# Patient Record
Sex: Male | Born: 1950 | Race: White | Hispanic: No | Smoking: Current every day smoker
Health system: Southern US, Community
[De-identification: ages and names within clinical notes are randomized; demographics above are authoritative.]

## PROBLEM LIST (undated history)

## (undated) DIAGNOSIS — J309 Allergic rhinitis, unspecified: Secondary | ICD-10-CM

## (undated) DIAGNOSIS — M1990 Unspecified osteoarthritis, unspecified site: Secondary | ICD-10-CM

## (undated) DIAGNOSIS — G8929 Other chronic pain: Secondary | ICD-10-CM

## (undated) DIAGNOSIS — Z955 Presence of coronary angioplasty implant and graft: Secondary | ICD-10-CM

## (undated) DIAGNOSIS — D649 Anemia, unspecified: Secondary | ICD-10-CM

## (undated) DIAGNOSIS — Z5189 Encounter for other specified aftercare: Secondary | ICD-10-CM

## (undated) DIAGNOSIS — C689 Malignant neoplasm of urinary organ, unspecified: Secondary | ICD-10-CM

## (undated) DIAGNOSIS — I10 Essential (primary) hypertension: Secondary | ICD-10-CM

## (undated) DIAGNOSIS — K635 Polyp of colon: Secondary | ICD-10-CM

## (undated) DIAGNOSIS — I219 Acute myocardial infarction, unspecified: Secondary | ICD-10-CM

## (undated) DIAGNOSIS — G629 Polyneuropathy, unspecified: Secondary | ICD-10-CM

## (undated) DIAGNOSIS — J449 Chronic obstructive pulmonary disease, unspecified: Secondary | ICD-10-CM

## (undated) DIAGNOSIS — N433 Hydrocele, unspecified: Secondary | ICD-10-CM

## (undated) DIAGNOSIS — C679 Malignant neoplasm of bladder, unspecified: Secondary | ICD-10-CM

## (undated) DIAGNOSIS — E785 Hyperlipidemia, unspecified: Secondary | ICD-10-CM

## (undated) DIAGNOSIS — R5383 Other fatigue: Secondary | ICD-10-CM

## (undated) DIAGNOSIS — G4730 Sleep apnea, unspecified: Secondary | ICD-10-CM

## (undated) DIAGNOSIS — I509 Heart failure, unspecified: Secondary | ICD-10-CM

## (undated) DIAGNOSIS — Z87891 Personal history of nicotine dependence: Secondary | ICD-10-CM

## (undated) DIAGNOSIS — IMO0002 Reserved for concepts with insufficient information to code with codable children: Secondary | ICD-10-CM

## (undated) DIAGNOSIS — I4510 Unspecified right bundle-branch block: Secondary | ICD-10-CM

## (undated) DIAGNOSIS — F419 Anxiety disorder, unspecified: Secondary | ICD-10-CM

## (undated) DIAGNOSIS — K219 Gastro-esophageal reflux disease without esophagitis: Secondary | ICD-10-CM

## (undated) DIAGNOSIS — I2510 Atherosclerotic heart disease of native coronary artery without angina pectoris: Secondary | ICD-10-CM

## (undated) DIAGNOSIS — D689 Coagulation defect, unspecified: Secondary | ICD-10-CM

## (undated) HISTORY — PX: UPPER GASTROINTESTINAL ENDOSCOPY: SHX188

## (undated) HISTORY — DX: Malignant neoplasm of urinary organ, unspecified: C68.9

## (undated) HISTORY — PX: MOUTH SURGERY: SHX715

## (undated) HISTORY — DX: Coagulation defect, unspecified: D68.9

## (undated) HISTORY — DX: Unspecified osteoarthritis, unspecified site: M19.90

## (undated) HISTORY — DX: Polyp of colon: K63.5

## (undated) HISTORY — DX: Presence of coronary angioplasty implant and graft: Z95.5

## (undated) HISTORY — PX: KNEE CARTILAGE SURGERY: SHX688

## (undated) HISTORY — DX: Heart failure, unspecified: I50.9

## (undated) HISTORY — PX: BLADDER SURGERY: SHX569

## (undated) HISTORY — DX: Encounter for other specified aftercare: Z51.89

## (undated) HISTORY — PX: CORONARY ANGIOPLASTY: SHX604

## (undated) HISTORY — PX: CARDIAC CATHETERIZATION: SHX172

## (undated) HISTORY — DX: Other fatigue: R53.83

## (undated) HISTORY — PX: COLONOSCOPY: SHX174

## (undated) HISTORY — PX: OTHER SURGICAL HISTORY: SHX169

## (undated) HISTORY — DX: Polyneuropathy, unspecified: G62.9

## (undated) HISTORY — PX: APPENDECTOMY: SHX54

## (undated) HISTORY — PX: ESOPHAGOGASTRODUODENOSCOPY: SHX1529

## (undated) HISTORY — DX: Allergic rhinitis, unspecified: J30.9

---

## 2004-06-18 ENCOUNTER — Inpatient Hospital Stay (HOSPITAL_COMMUNITY): Admission: AD | Admit: 2004-06-18 | Discharge: 2004-06-21 | Payer: Self-pay | Admitting: Cardiology

## 2013-08-08 ENCOUNTER — Emergency Department (HOSPITAL_COMMUNITY): Payer: Medicaid Other

## 2013-08-08 ENCOUNTER — Encounter (HOSPITAL_COMMUNITY)
Admission: EM | Disposition: A | Discharge status: Home / Self Care | Payer: Self-pay | Source: Home / Self Care | Attending: Cardiothoracic Surgery

## 2013-08-08 ENCOUNTER — Encounter (HOSPITAL_COMMUNITY): Payer: Self-pay | Admitting: Emergency Medicine

## 2013-08-08 ENCOUNTER — Inpatient Hospital Stay (HOSPITAL_COMMUNITY)
Admission: EM | Admit: 2013-08-08 | Discharge: 2013-08-15 | DRG: 234 | Disposition: A | Discharge status: Home / Self Care | Payer: Medicaid Other | Attending: Cardiothoracic Surgery | Admitting: Cardiothoracic Surgery

## 2013-08-08 DIAGNOSIS — J4489 Other specified chronic obstructive pulmonary disease: Secondary | ICD-10-CM | POA: Diagnosis present

## 2013-08-08 DIAGNOSIS — I200 Unstable angina: Secondary | ICD-10-CM

## 2013-08-08 DIAGNOSIS — F1010 Alcohol abuse, uncomplicated: Secondary | ICD-10-CM | POA: Diagnosis present

## 2013-08-08 DIAGNOSIS — D696 Thrombocytopenia, unspecified: Secondary | ICD-10-CM | POA: Diagnosis not present

## 2013-08-08 DIAGNOSIS — J449 Chronic obstructive pulmonary disease, unspecified: Secondary | ICD-10-CM | POA: Diagnosis present

## 2013-08-08 DIAGNOSIS — I252 Old myocardial infarction: Secondary | ICD-10-CM

## 2013-08-08 DIAGNOSIS — R5381 Other malaise: Secondary | ICD-10-CM | POA: Diagnosis present

## 2013-08-08 DIAGNOSIS — Z8551 Personal history of malignant neoplasm of bladder: Secondary | ICD-10-CM

## 2013-08-08 DIAGNOSIS — F17200 Nicotine dependence, unspecified, uncomplicated: Secondary | ICD-10-CM | POA: Diagnosis present

## 2013-08-08 DIAGNOSIS — R079 Chest pain, unspecified: Secondary | ICD-10-CM

## 2013-08-08 DIAGNOSIS — I2510 Atherosclerotic heart disease of native coronary artery without angina pectoris: Principal | ICD-10-CM | POA: Diagnosis present

## 2013-08-08 DIAGNOSIS — Z9119 Patient's noncompliance with other medical treatment and regimen: Secondary | ICD-10-CM

## 2013-08-08 DIAGNOSIS — J9819 Other pulmonary collapse: Secondary | ICD-10-CM | POA: Diagnosis not present

## 2013-08-08 DIAGNOSIS — E876 Hypokalemia: Secondary | ICD-10-CM | POA: Diagnosis not present

## 2013-08-08 DIAGNOSIS — Z91199 Patient's noncompliance with other medical treatment and regimen due to unspecified reason: Secondary | ICD-10-CM

## 2013-08-08 DIAGNOSIS — G8929 Other chronic pain: Secondary | ICD-10-CM | POA: Diagnosis present

## 2013-08-08 DIAGNOSIS — Z7982 Long term (current) use of aspirin: Secondary | ICD-10-CM

## 2013-08-08 DIAGNOSIS — Z8249 Family history of ischemic heart disease and other diseases of the circulatory system: Secondary | ICD-10-CM

## 2013-08-08 DIAGNOSIS — I10 Essential (primary) hypertension: Secondary | ICD-10-CM | POA: Diagnosis present

## 2013-08-08 DIAGNOSIS — D62 Acute posthemorrhagic anemia: Secondary | ICD-10-CM | POA: Diagnosis not present

## 2013-08-08 DIAGNOSIS — Z9861 Coronary angioplasty status: Secondary | ICD-10-CM

## 2013-08-08 DIAGNOSIS — Z951 Presence of aortocoronary bypass graft: Secondary | ICD-10-CM

## 2013-08-08 DIAGNOSIS — M129 Arthropathy, unspecified: Secondary | ICD-10-CM | POA: Diagnosis present

## 2013-08-08 DIAGNOSIS — I959 Hypotension, unspecified: Secondary | ICD-10-CM | POA: Diagnosis not present

## 2013-08-08 DIAGNOSIS — E785 Hyperlipidemia, unspecified: Secondary | ICD-10-CM | POA: Diagnosis present

## 2013-08-08 HISTORY — DX: Malignant neoplasm of bladder, unspecified: C67.9

## 2013-08-08 HISTORY — DX: Essential (primary) hypertension: I10

## 2013-08-08 HISTORY — DX: Reserved for concepts with insufficient information to code with codable children: IMO0002

## 2013-08-08 HISTORY — DX: Chronic obstructive pulmonary disease, unspecified: J44.9

## 2013-08-08 HISTORY — DX: Atherosclerotic heart disease of native coronary artery without angina pectoris: I25.10

## 2013-08-08 HISTORY — DX: Unstable angina: I20.0

## 2013-08-08 HISTORY — DX: Hydrocele, unspecified: N43.3

## 2013-08-08 HISTORY — DX: Hyperlipidemia, unspecified: E78.5

## 2013-08-08 HISTORY — DX: Chest pain, unspecified: R07.9

## 2013-08-08 HISTORY — PX: LEFT HEART CATHETERIZATION WITH CORONARY ANGIOGRAM: SHX5451

## 2013-08-08 HISTORY — DX: Unspecified osteoarthritis, unspecified site: M19.90

## 2013-08-08 LAB — URINALYSIS, ROUTINE W REFLEX MICROSCOPIC
Bilirubin Urine: NEGATIVE
Glucose, UA: NEGATIVE mg/dL
Hgb urine dipstick: NEGATIVE
Ketones, ur: NEGATIVE mg/dL
Leukocytes, UA: NEGATIVE
Nitrite: NEGATIVE
Protein, ur: NEGATIVE mg/dL
Specific Gravity, Urine: 1.01 (ref 1.005–1.030)
Urobilinogen, UA: 0.2 mg/dL (ref 0.0–1.0)
pH: 6 (ref 5.0–8.0)

## 2013-08-08 LAB — CBC WITH DIFFERENTIAL/PLATELET
Basophils Absolute: 0.1 10*3/uL (ref 0.0–0.1)
Basophils Relative: 1 % (ref 0–1)
Eosinophils Absolute: 0.4 10*3/uL (ref 0.0–0.7)
Eosinophils Relative: 4 % (ref 0–5)
HCT: 42.6 % (ref 39.0–52.0)
Hemoglobin: 15 g/dL (ref 13.0–17.0)
Lymphocytes Relative: 27 % (ref 12–46)
Lymphs Abs: 2.2 10*3/uL (ref 0.7–4.0)
MCH: 34.3 pg — ABNORMAL HIGH (ref 26.0–34.0)
MCHC: 35.2 g/dL (ref 30.0–36.0)
MCV: 97.5 fL (ref 78.0–100.0)
Monocytes Absolute: 1.1 10*3/uL — ABNORMAL HIGH (ref 0.1–1.0)
Monocytes Relative: 13 % — ABNORMAL HIGH (ref 3–12)
Neutro Abs: 4.4 10*3/uL (ref 1.7–7.7)
Neutrophils Relative %: 55 % (ref 43–77)
Platelets: 201 10*3/uL (ref 150–400)
RBC: 4.37 MIL/uL (ref 4.22–5.81)
RDW: 12.9 % (ref 11.5–15.5)
WBC: 8 10*3/uL (ref 4.0–10.5)

## 2013-08-08 LAB — POCT I-STAT 3, ART BLOOD GAS (G3+)
Bicarbonate: 23.6 mEq/L (ref 20.0–24.0)
O2 Saturation: 96 %
Patient temperature: 98.6
TCO2: 25 mmol/L (ref 0–100)
pCO2 arterial: 41.9 mmHg (ref 35.0–45.0)
pH, Arterial: 7.358 (ref 7.350–7.450)

## 2013-08-08 LAB — CREATININE, SERUM: Creatinine, Ser: 0.86 mg/dL (ref 0.50–1.35)

## 2013-08-08 LAB — BASIC METABOLIC PANEL
BUN: 15 mg/dL (ref 6–23)
CO2: 25 mEq/L (ref 19–32)
Calcium: 9 mg/dL (ref 8.4–10.5)
Chloride: 103 mEq/L (ref 96–112)
Creatinine, Ser: 0.83 mg/dL (ref 0.50–1.35)
GFR calc Af Amer: >90 mL/min (ref >90)
GFR calc non Af Amer: >90 mL/min (ref >90)
Glucose, Bld: 103 mg/dL — ABNORMAL HIGH (ref 70–99)
Potassium: 3.9 mEq/L (ref 3.5–5.1)
Sodium: 137 mEq/L (ref 135–145)

## 2013-08-08 LAB — CBC
Hemoglobin: 14.4 g/dL (ref 13.0–17.0)
MCH: 34 pg (ref 26.0–34.0)
MCHC: 35.3 g/dL (ref 30.0–36.0)
MCV: 96.5 fL (ref 78.0–100.0)
RBC: 4.23 MIL/uL (ref 4.22–5.81)
RDW: 13.1 % (ref 11.5–15.5)

## 2013-08-08 LAB — POCT I-STAT TROPONIN I: Troponin i, poc: 0.01 ng/mL (ref 0.00–0.08)

## 2013-08-08 LAB — PROTIME-INR
INR: 1.14 (ref 0.00–1.49)
Prothrombin Time: 14.4 seconds (ref 11.6–15.2)

## 2013-08-08 LAB — TROPONIN I: Troponin I: <0.3 ng/mL (ref <0.30)

## 2013-08-08 LAB — SURGICAL PCR SCREEN: MRSA, PCR: NEGATIVE

## 2013-08-08 LAB — PREPARE RBC (CROSSMATCH)

## 2013-08-08 SURGERY — LEFT HEART CATHETERIZATION WITH CORONARY ANGIOGRAM
Anesthesia: LOCAL

## 2013-08-08 MED ORDER — CHLORHEXIDINE GLUCONATE 4 % EX LIQD: 60.0000 mL | Freq: Once | CUTANEOUS | Status: DC

## 2013-08-08 MED ORDER — HEPARIN SODIUM (PORCINE) 5000 UNIT/ML IJ SOLN
5000.0000 [IU] | Freq: Three times a day (TID) | INTRAMUSCULAR | Status: DC
  Filled 2013-08-08 (×3): qty 1

## 2013-08-08 MED ORDER — BISACODYL 5 MG PO TBEC: 5.0000 mg | DELAYED_RELEASE_TABLET | Freq: Once | ORAL | Status: DC

## 2013-08-08 MED ORDER — MORPHINE SULFATE 4 MG/ML IJ SOLN
4.0000 mg | INTRAMUSCULAR | Status: DC | PRN
  Administered 2013-08-10: 4 mg via INTRAVENOUS
  Filled 2013-08-08: qty 1

## 2013-08-08 MED ORDER — MIDAZOLAM HCL 2 MG/2ML IJ SOLN
INTRAMUSCULAR | Status: AC
  Filled 2013-08-08: qty 2

## 2013-08-08 MED ORDER — NITROGLYCERIN 0.4 MG SL SUBL: 0.4000 mg | SUBLINGUAL_TABLET | SUBLINGUAL | Status: DC | PRN

## 2013-08-08 MED ORDER — HEPARIN SODIUM (PORCINE) 1000 UNIT/ML IJ SOLN
INTRAMUSCULAR | Status: AC
  Filled 2013-08-08: qty 1

## 2013-08-08 MED ORDER — ASPIRIN EC 81 MG PO TBEC
81.0000 mg | DELAYED_RELEASE_TABLET | Freq: Every day | ORAL | Status: DC
Start: 2013-08-09 — End: 2013-08-10
  Administered 2013-08-09: 81 mg via ORAL
  Filled 2013-08-08 (×2): qty 1

## 2013-08-08 MED ORDER — DIAZEPAM 5 MG PO TABS
5.0000 mg | ORAL_TABLET | Freq: Once | ORAL | Status: AC
  Administered 2013-08-10: 5 mg via ORAL
  Filled 2013-08-08: qty 1

## 2013-08-08 MED ORDER — HEPARIN (PORCINE) IN NACL 2-0.9 UNIT/ML-% IJ SOLN
INTRAMUSCULAR | Status: AC
  Filled 2013-08-08: qty 1500

## 2013-08-08 MED ORDER — SODIUM CHLORIDE 0.9 % IV SOLN: 250.0000 mL | INTRAVENOUS | Status: DC | PRN

## 2013-08-08 MED ORDER — SODIUM CHLORIDE 0.9 % IV SOLN
INTRAVENOUS | Status: DC
  Administered 2013-08-08 – 2013-08-09 (×2): via INTRAVENOUS

## 2013-08-08 MED ORDER — ASPIRIN 81 MG PO CHEW
81.0000 mg | CHEWABLE_TABLET | ORAL | Status: AC
  Administered 2013-08-08: 81 mg via ORAL
  Filled 2013-08-08: qty 1

## 2013-08-08 MED ORDER — FENTANYL CITRATE 0.05 MG/ML IJ SOLN
INTRAMUSCULAR | Status: AC
  Filled 2013-08-08: qty 2

## 2013-08-08 MED ORDER — SODIUM CHLORIDE 0.9 % IV SOLN
INTRAVENOUS | Status: DC
  Administered 2013-08-08: 12:00:00 via INTRAVENOUS

## 2013-08-08 MED ORDER — METOPROLOL TARTRATE 12.5 MG HALF TABLET
12.5000 mg | ORAL_TABLET | Freq: Once | ORAL | Status: DC
  Filled 2013-08-08: qty 1

## 2013-08-08 MED ORDER — ALPRAZOLAM 0.25 MG PO TABS: 0.2500 mg | ORAL_TABLET | ORAL | Status: DC | PRN

## 2013-08-08 MED ORDER — TEMAZEPAM 15 MG PO CAPS
15.0000 mg | ORAL_CAPSULE | Freq: Once | ORAL | Status: AC | PRN
  Administered 2013-08-09: 15 mg via ORAL
  Filled 2013-08-08: qty 1

## 2013-08-08 MED ORDER — LIDOCAINE HCL (PF) 1 % IJ SOLN
INTRAMUSCULAR | Status: AC
  Filled 2013-08-08: qty 30

## 2013-08-08 MED ORDER — NITROGLYCERIN 0.2 MG/ML ON CALL CATH LAB
INTRAVENOUS | Status: AC
  Filled 2013-08-08: qty 1

## 2013-08-08 MED ORDER — SODIUM CHLORIDE 0.9 % IJ SOLN: 3.0000 mL | INTRAMUSCULAR | Status: DC | PRN

## 2013-08-08 MED ORDER — TEMAZEPAM 15 MG PO CAPS: 15.0000 mg | ORAL_CAPSULE | Freq: Once | ORAL | Status: DC | PRN

## 2013-08-08 MED ORDER — VERAPAMIL HCL 2.5 MG/ML IV SOLN
INTRAVENOUS | Status: AC
  Filled 2013-08-08: qty 2

## 2013-08-08 MED ORDER — GUAIFENESIN-DM 100-10 MG/5ML PO SYRP
5.0000 mL | ORAL_SOLUTION | ORAL | Status: DC | PRN
  Administered 2013-08-09: 5 mL via ORAL
  Filled 2013-08-08: qty 5

## 2013-08-08 MED ORDER — NICOTINE 14 MG/24HR TD PT24
14.0000 mg | MEDICATED_PATCH | Freq: Every day | TRANSDERMAL | Status: DC
  Filled 2013-08-08 (×3): qty 1

## 2013-08-08 MED ORDER — SODIUM CHLORIDE 0.9 % IJ SOLN: 3.0000 mL | Freq: Two times a day (BID) | INTRAMUSCULAR | Status: DC

## 2013-08-08 MED ORDER — NITROGLYCERIN IN D5W 200-5 MCG/ML-% IV SOLN
INTRAVENOUS | Status: AC
  Filled 2013-08-08: qty 250

## 2013-08-08 MED ORDER — NITROGLYCERIN IN D5W 200-5 MCG/ML-% IV SOLN
2.0000 ug/min | INTRAVENOUS | Status: DC
  Filled 2013-08-08: qty 250

## 2013-08-08 MED ORDER — ONDANSETRON HCL 4 MG/2ML IJ SOLN: 4.0000 mg | Freq: Four times a day (QID) | INTRAMUSCULAR | Status: DC | PRN

## 2013-08-08 MED ORDER — HEPARIN (PORCINE) IN NACL 100-0.45 UNIT/ML-% IJ SOLN
1100.0000 [IU]/h | INTRAMUSCULAR | Status: DC
  Administered 2013-08-08: 900 [IU]/h via INTRAVENOUS
  Administered 2013-08-09: 1100 [IU]/h via INTRAVENOUS
  Filled 2013-08-08 (×4): qty 250

## 2013-08-08 MED ORDER — ACETAMINOPHEN 325 MG PO TABS
650.0000 mg | ORAL_TABLET | ORAL | Status: DC | PRN
  Administered 2013-08-10: 650 mg via ORAL
  Filled 2013-08-08: qty 2

## 2013-08-08 MED ORDER — BUDESONIDE-FORMOTEROL FUMARATE 160-4.5 MCG/ACT IN AERO
2.0000 | INHALATION_SPRAY | Freq: Two times a day (BID) | RESPIRATORY_TRACT | Status: DC
  Administered 2013-08-08 – 2013-08-09 (×3): 2 via RESPIRATORY_TRACT
  Filled 2013-08-08: qty 6

## 2013-08-08 MED ORDER — ATORVASTATIN CALCIUM 80 MG PO TABS
80.0000 mg | ORAL_TABLET | Freq: Every day | ORAL | Status: DC
  Administered 2013-08-08 – 2013-08-09 (×2): 80 mg via ORAL
  Filled 2013-08-08 (×4): qty 1

## 2013-08-08 MED ORDER — OXYCODONE-ACETAMINOPHEN 5-325 MG PO TABS: 1.0000 | ORAL_TABLET | ORAL | Status: DC | PRN

## 2013-08-08 NOTE — Progress Notes (Signed)
Utilization Review Completed.Erik Alvarado T11/28/2014  

## 2013-08-08 NOTE — ED Notes (Signed)
Patient transported to X-ray 

## 2013-08-08 NOTE — Interval H&P Note (Signed)
History and Physical Interval Note:  08/08/2013 1:15 PM  Erik Alvarado  has presented today for surgery, with the diagnosis of Chest Pain  The various methods of treatment have been discussed with the patient and family. After consideration of risks, benefits and other options for treatment, the patient has consented to  Procedure(s): LEFT HEART CATHETERIZATION WITH CORONARY ANGIOGRAM (N/A) as a surgical intervention .  The patient's history has been reviewed, patient examined, no change in status, stable for surgery.  I have reviewed the patient's chart and labs.  Questions were answered to the patient's satisfaction.    Cath Lab Visit (complete for each Cath Lab visit)  Clinical Evaluation Leading to the Procedure:   ACS: yes  Non-ACS:    Anginal Classification: CCS IV  Anti-ischemic medical therapy: No Therapy  Non-Invasive Test Results: No non-invasive testing performed  Prior CABG: No previous CABG       Tonny Bollman

## 2013-08-08 NOTE — CV Procedure (Signed)
Procedure: left heart cath, coronary angio, LV angio.  Indication: unstable angina. Had resting chest pain at 0200 today.  Findings:  Pt with severe/critical left main disease and severe stenosis of the RCA.   Plan heparin/IV NTG, cardiac surgery consult.   Full cath note to follow.  Tonny Bollman 08/08/2013 2:06 PM

## 2013-08-08 NOTE — ED Provider Notes (Signed)
Medical screening examination/treatment/procedure(s) were performed by non-physician practitioner and as supervising physician I was immediately available for consultation/collaboration.  EKG Interpretation    Date/Time:  Friday August 08 2013 03:44:52 EST Ventricular Rate:  72 PR Interval:  145 QRS Duration: 103 QT Interval:  387 QTC Calculation: 423 R Axis:   64 Text Interpretation:  Sinus rhythm Normal EKG.  Unchanged from prior.  Confirmed by Micheline Maze  MD, Elyza Whitt [phone]) on 08/08/2013 6:18:42 AM              Shanna Cisco, MD 08/08/13 2031
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
## 2013-08-08 NOTE — ED Notes (Signed)
Pt brought in by EMS, Pt c/o chest pain that started around 0240 08/08/13, pt stated that he has been non compliant with all of his medications x 2 months, Pt rec'd 2 nitro and 325 ASA in route via EMS.  Pt is not complaining of Chest pain at this time

## 2013-08-08 NOTE — CV Procedure (Signed)
    Cardiac Catheterization Procedure Note  Name: Erik Alvarado MRN: [id number] DOB: 1951-07-18  Procedure: Left Heart Cath, Selective Coronary Angiography, LV angiography  Indication: Unstable angina. 62 year-old male with multiple past PCI procedures presents with typical symptoms of unstable angina. Referred for cardiac cath.   Procedural Details: The left wrist was prepped, draped, and anesthetized with 1% lidocaine. Using the modified Seldinger technique, a 5/6 French sheath was introduced into the left radial artery. 3 mg of verapamil was administered through the sheath, weight-based unfractionated heparin was administered intravenously. Standard Judkins catheters were used for selective coronary angiography and left ventriculography. Catheter exchanges were performed over an exchange length guidewire. There were no immediate procedural complications. A TR band was used for radial hemostasis at the completion of the procedure.  The patient was transferred to the post catheterization recovery area for further monitoring.  Procedural Findings: Hemodynamics: AO 122/64 LV 125/10  Coronary angiography: Coronary dominance: right  Left mainstem: Critical diffuse 90-95% stenosis  Left anterior descending (LAD): The LAD is stented in it's proximal segment with no significant in-stent restenosis. The mid- and distal-LAD have no significant disease.  Left circumflex (LCx): 70% ostial stenosis, 50% stenosis OM1, 80% ostial OM2 stenosis. OM branches are medium in caliber.  Right coronary artery (RCA): The vessel is dominant. The proximal and mid-RCA are extensively stented. There is diffuse in-stent restenosis of 50% in the mid-vessel, then tandem 80% ISR lesions distally. Beyond the stented segment there is 50% distal RCA stenosis. The PDA and PLA branches have diffuse nonobstructive stenosis.  Left ventriculography: Left ventricular systolic function is normal, LVEF is estimated at 55-65%,  there is no significant mitral regurgitation   Final Conclusions:   1. Severe left main stenosis 2. Severe LCx stenosis  3. Severe RCA in-stent restenosis 4. Patent LAD stents 5. Normal LV function  Recommendations: TCTS consult for CABG. Resume IV heparin and NTG.  Tonny Bollman 08/08/2013, 4:56 PM

## 2013-08-09 DIAGNOSIS — J449 Chronic obstructive pulmonary disease, unspecified: Secondary | ICD-10-CM

## 2013-08-09 DIAGNOSIS — I369 Nonrheumatic tricuspid valve disorder, unspecified: Secondary | ICD-10-CM

## 2013-08-09 DIAGNOSIS — J4489 Other specified chronic obstructive pulmonary disease: Secondary | ICD-10-CM

## 2013-08-09 DIAGNOSIS — Z01810 Encounter for preprocedural cardiovascular examination: Secondary | ICD-10-CM

## 2013-08-09 LAB — LIPID PANEL
Cholesterol: 188 mg/dL (ref 0–200)
LDL Cholesterol: 133 mg/dL — ABNORMAL HIGH (ref 0–99)
Triglycerides: 95 mg/dL (ref <150)
VLDL: 19 mg/dL (ref 0–40)

## 2013-08-09 LAB — BLOOD GAS, ARTERIAL
Acid-base deficit: 0.6 mmol/L (ref 0.0–2.0)
Bicarbonate: 23.5 mEq/L (ref 20.0–24.0)
FIO2: 0.21 %
O2 Saturation: 96.6 %
Patient temperature: 98.6
TCO2: 24.7 mmol/L (ref 0–100)
pCO2 arterial: 38.4 mmHg (ref 35.0–45.0)
pH, Arterial: 7.404 (ref 7.350–7.450)
pO2, Arterial: 76.7 mmHg — ABNORMAL LOW (ref 80.0–100.0)

## 2013-08-09 LAB — COMPREHENSIVE METABOLIC PANEL
ALT: 11 U/L (ref 0–53)
AST: 14 U/L (ref 0–37)
Albumin: 3 g/dL — ABNORMAL LOW (ref 3.5–5.2)
Alkaline Phosphatase: 57 U/L (ref 39–117)
BUN: 10 mg/dL (ref 6–23)
CO2: 27 mEq/L (ref 19–32)
Calcium: 8.2 mg/dL — ABNORMAL LOW (ref 8.4–10.5)
Chloride: 107 mEq/L (ref 96–112)
Creatinine, Ser: 0.91 mg/dL (ref 0.50–1.35)
GFR calc Af Amer: >90 mL/min (ref >90)
GFR calc non Af Amer: 88 mL/min — ABNORMAL LOW (ref >90)
Glucose, Bld: 98 mg/dL (ref 70–99)
Potassium: 4.1 mEq/L (ref 3.5–5.1)
Sodium: 141 mEq/L (ref 135–145)
Total Bilirubin: 0.4 mg/dL (ref 0.3–1.2)
Total Protein: 5.7 g/dL — ABNORMAL LOW (ref 6.0–8.3)

## 2013-08-09 LAB — CBC
HCT: 41.9 % (ref 39.0–52.0)
Hemoglobin: 14.5 g/dL (ref 13.0–17.0)
MCH: 34 pg (ref 26.0–34.0)
MCHC: 34.6 g/dL (ref 30.0–36.0)
MCV: 98.4 fL (ref 78.0–100.0)
RBC: 4.26 MIL/uL (ref 4.22–5.81)

## 2013-08-09 LAB — HEMOGLOBIN A1C
Hgb A1c MFr Bld: 5.6 % (ref <5.7)
Hgb A1c MFr Bld: 5.5 % (ref <5.7)
Mean Plasma Glucose: 114 mg/dL (ref <117)
Mean Plasma Glucose: 111 mg/dL (ref <117)

## 2013-08-09 LAB — TSH
TSH: 0.693 u[IU]/mL (ref 0.350–4.500)
TSH: 0.954 u[IU]/mL (ref 0.350–4.500)

## 2013-08-09 LAB — HEPARIN LEVEL (UNFRACTIONATED): Heparin Unfractionated: 0.46 IU/mL (ref 0.30–0.70)

## 2013-08-09 MED ORDER — SODIUM CHLORIDE 0.9 % IV SOLN
INTRAVENOUS | Status: AC
  Administered 2013-08-10: 1 [IU]/h via INTRAVENOUS
  Filled 2013-08-09: qty 1

## 2013-08-09 MED ORDER — EPINEPHRINE HCL 1 MG/ML IJ SOLN
0.5000 ug/min | INTRAVENOUS | Status: DC
  Filled 2013-08-09: qty 4

## 2013-08-09 MED ORDER — CHLORHEXIDINE GLUCONATE 4 % EX LIQD
60.0000 mL | Freq: Once | CUTANEOUS | Status: AC
  Administered 2013-08-10: 4 via TOPICAL
  Filled 2013-08-09: qty 60

## 2013-08-09 MED ORDER — PHENYLEPHRINE HCL 10 MG/ML IJ SOLN
30.0000 ug/min | INTRAVENOUS | Status: DC
  Filled 2013-08-09: qty 2

## 2013-08-09 MED ORDER — LEVOFLOXACIN IN D5W 500 MG/100ML IV SOLN
500.0000 mg | INTRAVENOUS | Status: AC
  Administered 2013-08-10: 500 mg via INTRAVENOUS
  Filled 2013-08-09: qty 100

## 2013-08-09 MED ORDER — POTASSIUM CHLORIDE 2 MEQ/ML IV SOLN
80.0000 meq | INTRAVENOUS | Status: DC
  Filled 2013-08-09: qty 40

## 2013-08-09 MED ORDER — SODIUM CHLORIDE 0.9 % IV SOLN
INTRAVENOUS | Status: DC
  Filled 2013-08-09: qty 40

## 2013-08-09 MED ORDER — METOPROLOL TARTRATE 12.5 MG HALF TABLET
12.5000 mg | ORAL_TABLET | Freq: Two times a day (BID) | ORAL | Status: DC
  Administered 2013-08-09 (×2): 12.5 mg via ORAL
  Filled 2013-08-09 (×4): qty 1

## 2013-08-09 MED ORDER — THIAMINE HCL 100 MG/ML IJ SOLN
100.0000 mg | Freq: Every day | INTRAMUSCULAR | Status: DC
  Administered 2013-08-09: 100 mg via INTRAVENOUS
  Filled 2013-08-09 (×2): qty 1

## 2013-08-09 MED ORDER — MAGNESIUM SULFATE 50 % IJ SOLN
40.0000 meq | INTRAMUSCULAR | Status: DC
  Filled 2013-08-09: qty 10

## 2013-08-09 MED ORDER — DOPAMINE-DEXTROSE 3.2-5 MG/ML-% IV SOLN
2.0000 ug/kg/min | INTRAVENOUS | Status: DC
  Filled 2013-08-09: qty 250

## 2013-08-09 MED ORDER — SODIUM CHLORIDE 0.9 % IV SOLN
INTRAVENOUS | Status: DC
  Filled 2013-08-09: qty 30

## 2013-08-09 MED ORDER — CHLORHEXIDINE GLUCONATE 4 % EX LIQD
60.0000 mL | Freq: Once | CUTANEOUS | Status: AC
  Administered 2013-08-09: 4 via TOPICAL
  Filled 2013-08-09: qty 60

## 2013-08-09 MED ORDER — PLASMA-LYTE 148 IV SOLN
INTRAVENOUS | Status: AC
  Administered 2013-08-10: 10:00:00
  Filled 2013-08-09: qty 2.5

## 2013-08-09 MED ORDER — DEXMEDETOMIDINE HCL IN NACL 400 MCG/100ML IV SOLN
0.1000 ug/kg/h | INTRAVENOUS | Status: AC
  Administered 2013-08-10: 0.3 ug/kg/h via INTRAVENOUS
  Filled 2013-08-09: qty 100

## 2013-08-09 MED ORDER — VANCOMYCIN HCL 10 G IV SOLR
1250.0000 mg | INTRAVENOUS | Status: AC
  Administered 2013-08-10: 1250 mg via INTRAVENOUS
  Filled 2013-08-09: qty 1250

## 2013-08-09 MED ORDER — NITROGLYCERIN IN D5W 200-5 MCG/ML-% IV SOLN: 2.0000 ug/min | INTRAVENOUS | Status: DC

## 2013-08-09 NOTE — Progress Notes (Signed)
ANTICOAGULATION CONSULT NOTE - Follow Up Consult  Pharmacy Consult for Heparin  Indication: Severe left main disease, severe stenosis RCA  Allergies  Allergen Reactions  . Amoxicillin Rash  . Cephalexin Rash    Rash, and couldn't sleep    Patient Measurements: Height: 6' (182.9 cm) Weight: 171 lb 1.2 oz (77.6 kg) IBW/kg (Calculated) : 77.6  Vital Signs: Temp: 98.3 F (36.8 C) (11/29 0743) Temp src: Oral (11/29 0743) BP: 121/59 mmHg (11/29 1000) Pulse Rate: 63 (11/29 1000)  Labs:  Recent Labs  08/08/13 0446 08/08/13 1947 08/08/13 2307 08/09/13 0505 08/09/13 0910  HGB 15.0 14.4  --  14.5  --   HCT 42.6 40.8  --  41.9  --   PLT 201 198  --  185  --   LABPROT  --  14.4  --   --   --   INR  --  1.14  --   --   --   HEPARINUNFRC  --   --  0.15*  --  0.41  CREATININE 0.83 0.86  --  0.91  --   TROPONINI  --  <0.30  --   --   --    Estimated Creatinine Clearance: 90 ml/min (by C-G formula based on Cr of 0.91).  Medications:  Heparin 1100 units/hr  Assessment: 62 y/o M awaiting CABG scheduled for tomorrow.  Heparin level is 0.41 after heparin infusion rate increased to 1100 units/hr. No bleeding noted.   Goal of Therapy:  Heparin level 0.3-0.7 units/ml Monitor platelets by anticoagulation protocol: Yes   Plan:  Continue heparin drip at 1100 units/hr -Recheck heparin level at 1530 to confirm remains therapeutic. -Daily CBC/HL -Monitor for bleeding -Plan for CABG tomorrow 08/10/13.  Thank you for allowing me to take part in this patient's care, Noah Delaine, RPh Clinical Pharmacist Pager: [phone] 08/09/2013 10:21 AM

## 2013-08-09 NOTE — Progress Notes (Signed)
ANTICOAGULATION CONSULT NOTE - Follow Up Consult  Pharmacy Consult for Heparin Indication: LM disease  Allergies  Allergen Reactions  . Amoxicillin Rash  . Cephalexin Rash    Rash, and couldn't sleep    Patient Measurements: Height: 6' (182.9 cm) Weight: 171 lb 1.2 oz (77.6 kg) IBW/kg (Calculated) : 77.6 Heparin Dosing Weight:  Vital Signs: Temp: 98.4 F (36.9 C) (11/29 1600) Temp src: Oral (11/29 1600) BP: 138/69 mmHg (11/29 1600) Pulse Rate: 54 (11/29 1600)  Labs:  Recent Labs  08/08/13 0446 08/08/13 1947 08/08/13 2307 08/09/13 0505 08/09/13 0910 08/09/13 1640  HGB 15.0 14.4  --  14.5  --   --   HCT 42.6 40.8  --  41.9  --   --   PLT 201 198  --  185  --   --   LABPROT  --  14.4  --   --   --   --   INR  --  1.14  --   --   --   --   HEPARINUNFRC  --   --  0.15*  --  0.41 0.46  CREATININE 0.83 0.86  --  0.91  --   --   TROPONINI  --  <0.30  --   --   --   --     Estimated Creatinine Clearance: 90 ml/min (by C-G formula based on Cr of 0.91).   Assessment: Anticoagulation- s/p cath 11/28 =Severe left main disease, severe stenosis RCA , plan CABG Sun 11/30; HL therapeutic 0.46 on 1100 units/hr, no bleeding, CBC stable, no further CP this AM   Goal of Therapy:  Heparin level 0.3-0.7 units/ml Monitor platelets by anticoagulation protocol: Yes   Plan:  Continue IV heparin at 1100 units/hr Next heparin level in am.   Kirt Chew S. Merilynn Finland, PharmD, BCPS Clinical Staff Pharmacist Pager [phone]  Misty Stanley Stillinger 08/09/2013,5:20 PM

## 2013-08-09 NOTE — Progress Notes (Signed)
  Echocardiogram 2D Echocardiogram has been performed.  Erik Alvarado Erik Alvarado 08/09/2013, 10:05 AM

## 2013-08-09 NOTE — Plan of Care (Signed)
Problem: Consults Goal: Tobacco Cessation referral if indicated Outcome: Completed/Met Date Met:  08/09/13 Written educational material for smoking cessation given to patient.

## 2013-08-09 NOTE — Plan of Care (Signed)
Problem: Consults Goal: Cardiac Surgery Patient Education ( See Patient Education module for education specifics.)  Outcome: Completed/Met Date Met:  08/09/13 Patient completed reading of cardiac surgery book and Incentive spirometer pamphlet.  He refused to watch any videos, stating that he did not feel that they were necessary.  Demonstrated correct use of IS; able to pull 2500 ml.

## 2013-08-09 NOTE — Progress Notes (Signed)
1 Day Post-Op Procedure(s) (LRB): LEFT HEART CATHETERIZATION WITH CORONARY ANGIOGRAM (N/A) Subjective: Severe left main stenosis with three-vessel CAD and unstable angina Patient remains pain-free and stable on IV heparin 2-D echocardiogram shows good LV function without significant valvular disease Carotid Doppler show no significant carotid stenosis Room-air blood gases satisfactory Renal function normal following cardiac catheterization Plan multivessel bypass grafting for 90-95% left main stenosis and three-vessel CAD in a.m. Procedure indications benefits and risks reviewed again with patient and wife and he agrees to proceed.  Objective: Vital signs in last 24 hours: Temp:  [97.8 F (36.6 C)-98.4 F (36.9 C)] 98.3 F (36.8 C) (11/29 0743) Pulse Rate:  [50-66] 55 (11/29 1500) Cardiac Rhythm:  [-] Sinus bradycardia (11/29 1404) Resp:  [11-21] 11 (11/29 1500) BP: (97-144)/(41-118) 122/86 mmHg (11/29 1500) SpO2:  [95 %-99 %] 99 % (11/29 1500) Weight:  [171 lb 1.2 oz (77.6 kg)] 171 lb 1.2 oz (77.6 kg) (11/29 0500)  Hemodynamic parameters for last 24 hours:   afebrile, normal sinus rhythm  Intake/Output from previous day: 11/28 0701 - 11/29 0700 In: 1546.5 [I.V.:1546.5] Out: 1725 [Urine:1725] Intake/Output this shift: Total I/O In: 1312 [P.O.:600; I.V.:712] Out: 600 [Urine:600]  Alert and comfortable Lungs clear Heart rate regular murmur Abdomen soft Extremities warm  Lab Results:  Recent Labs  08/08/13 1947 08/09/13 0505  WBC 6.1 7.9  HGB 14.4 14.5  HCT 40.8 41.9  PLT 198 185   BMET:  Recent Labs  08/08/13 0446 08/08/13 1947 08/09/13 0505  NA 137  --  141  K 3.9  --  4.1  CL 103  --  107  CO2 25  --  27  GLUCOSE 103*  --  98  BUN 15  --  10  CREATININE 0.83 0.86 0.91  CALCIUM 9.0  --  8.2*    PT/INR:  Recent Labs  08/08/13 1947  LABPROT 14.4  INR 1.14   ABG    Component Value Date/Time   PHART 7.404 08/09/2013 0502   HCO3 23.5 08/09/2013  0502   TCO2 24.7 08/09/2013 0502   ACIDBASEDEF 0.6 08/09/2013 0502   O2SAT 96.6 08/09/2013 0502   CBG (last 3)  No results found for this basename: GLUCAP,  in the last 72 hours  Assessment/Plan: S/P Procedure(s) (LRB): LEFT HEART CATHETERIZATION WITH CORONARY ANGIOGRAM (N/A) Plan CABG in a.m. Situation reviewed in detail with the patient and wife and all questions addressed   LOS: 1 day    VAN TRIGT III,Kaleiyah Polsky 08/09/2013

## 2013-08-09 NOTE — Progress Notes (Signed)
Patient ID: Erik Alvarado, male   DOB: 1951-07-14, 62 y.o.   MRN: [id number]   SUBJECTIVE: No further chest pain.  No complaints this morning.   Marland Kitchen aspirin EC  81 mg Oral Daily  . atorvastatin  80 mg Oral q1800  . bisacodyl  5 mg Oral Once  . bisacodyl  5 mg Oral Once  . budesonide-formoterol  2 puff Inhalation BID  . chlorhexidine  60 mL Topical Once   And  . [START ON 08/10/2013] chlorhexidine  60 mL Topical Once  . [START ON 08/10/2013] diazepam  5 mg Oral Once  . [START ON 08/10/2013] metoprolol tartrate  12.5 mg Oral Once  . metoprolol tartrate  12.5 mg Oral BID  . nicotine  14 mg Transdermal Daily  heparin gtt NTG gtt    Filed Vitals:   08/09/13 0700 08/09/13 0743 08/09/13 0758 08/09/13 0800  BP: 135/62   138/64  Pulse: 50   53  Temp:  98.3 F (36.8 C)    TempSrc:  Oral    Resp: 19   12  Height:      Weight:      SpO2: 97%  95% 96%    Intake/Output Summary (Last 24 hours) at 08/09/13 0832 Last data filed at 08/09/13 0800  Gross per 24 hour  Intake 1635.5 ml  Output   1725 ml  Net  -89.5 ml    LABS: Basic Metabolic Panel:  Recent Labs  52/84/13 0446 08/08/13 1947 08/09/13 0505  NA 137  --  141  K 3.9  --  4.1  CL 103  --  107  CO2 25  --  27  GLUCOSE 103*  --  98  BUN 15  --  10  CREATININE 0.83 0.86 0.91  CALCIUM 9.0  --  8.2*   Liver Function Tests:  Recent Labs  08/09/13 0505  AST 14  ALT 11  ALKPHOS 57  BILITOT 0.4  PROT 5.7*  ALBUMIN 3.0*   No results found for this basename: LIPASE, AMYLASE,  in the last 72 hours CBC:  Recent Labs  08/08/13 0446 08/08/13 1947 08/09/13 0505  WBC 8.0 6.1 7.9  NEUTROABS 4.4  --   --   HGB 15.0 14.4 14.5  HCT 42.6 40.8 41.9  MCV 97.5 96.5 98.4  PLT 201 198 185   Cardiac Enzymes:  Recent Labs  08/08/13 1947  TROPONINI <0.30   BNP: No components found with this basename: POCBNP,  D-Dimer: No results found for this basename: DDIMER,  in the last 72 hours Hemoglobin A1C:  Recent  Labs  08/08/13 1947  HGBA1C 5.6   Fasting Lipid Panel:  Recent Labs  08/09/13 0505  CHOL 188  HDL 36*  LDLCALC 133*  TRIG 95  CHOLHDL 5.2   Thyroid Function Tests:  Recent Labs  08/08/13 1947  TSH 0.693   Anemia Panel: No results found for this basename: VITAMINB12, FOLATE, FERRITIN, TIBC, IRON, RETICCTPCT,  in the last 72 hours  RADIOLOGY: Dg Chest 2 View  08/08/2013   CLINICAL DATA:  Chest pain and shortness of breath.  EXAM: CHEST  2 VIEW  COMPARISON:  06/19/2004.  FINDINGS: Hyperinflation consistent with emphysema. Peribronchial thickening consistent with chronic bronchitis. Coronary stents. Normal heart size and pulmonary vascularity. No focal consolidation or airspace disease in the lungs. No blunting of costophrenic angles. No pneumothorax. Degenerative changes in the spine.  IMPRESSION: Emphysematous and chronic bronchitic changes in the lungs. No evidence of active pulmonary disease.  Electronically Signed   By: Burman Nieves M.D.   On: 08/08/2013 05:30    PHYSICAL EXAM General: NAD Neck: No JVD, no thyromegaly or thyroid nodule.  Lungs: Distant breath sounds bilaterally. CV: Nondisplaced PMI.  Heart regular S1/S2, no S3/S4, no murmur.  No peripheral edema.  No carotid bruit.  Normal pedal pulses.  Abdomen: Soft, nontender, no hepatosplenomegaly, no distention.  Neurologic: Alert and oriented x 3.  Psych: Normal affect. Extremities: No clubbing or cyanosis.   TELEMETRY: Reviewed telemetry pt in NSR  ASSESSMENT AND PLAN: 62 yo with history of CAD and COPD presented with unstable angina.   1. Unstable angina: 90-95% LM stenosis along with severe LCx and RCA disease on LHC yesterday.  EF preserved by LV-gram.  Plan for CABG tomorrow.  - Continue ASA 81, statin, and heparin gtt.  - Can use low dose metoprolol (HR in 50s at times).  - Hold off on ACEI until after CABG.  2. COPD: Still smokes.  Encouraged to quit.  3. ETOH abuse: Will need to watch for  withdrawal.   Erik Alvarado 08/09/2013 8:34 AM

## 2013-08-09 NOTE — Progress Notes (Signed)
ANTICOAGULATION CONSULT NOTE - Follow Up Consult  Pharmacy Consult for Heparin  Indication: Severe left main disease, severe stenosis RCA  Allergies  Allergen Reactions  . Amoxicillin Rash  . Cephalexin Rash    Rash, and couldn't sleep    Patient Measurements: Height: 6' (182.9 cm) Weight: 165 lb 9.1 oz (75.1 kg) IBW/kg (Calculated) : 77.6  Vital Signs: Temp: 98.4 F (36.9 C) (11/29 0000) Temp src: Oral (11/29 0000) BP: 97/41 mmHg (11/29 0000) Pulse Rate: 51 (11/29 0000)  Labs:  Recent Labs  08/08/13 0446 08/08/13 1947 08/08/13 2307  HGB 15.0 14.4  --   HCT 42.6 40.8  --   PLT 201 198  --   LABPROT  --  14.4  --   INR  --  1.14  --   HEPARINUNFRC  --   --  0.15*  CREATININE 0.83 0.86  --   TROPONINI  --  <0.30  --    Estimated Creatinine Clearance: 92.2 ml/min (by C-G formula based on Cr of 0.86).  Medications:  Heparin 900 units/hr  Assessment: 62 y/o M awaiting CABG within 48 hours. HL is 0.15. No issues per RN.   Goal of Therapy:  Heparin level 0.3-0.7 units/ml Monitor platelets by anticoagulation protocol: Yes   Plan:  -Increase heparin drip to 1100 units/hr -HL at 0800 -Daily CBC/HL -Monitor for bleeding -Plan for surgical revascularization within 48 hours per MD  Thank you for allowing me to take part in this patient's care,  Abran Duke, PharmD Clinical Pharmacist Phone: [phone] Pager: [phone] 08/09/2013 1:00 AM

## 2013-08-09 NOTE — Progress Notes (Addendum)
VASCULAR LAB PRELIMINARY  PRELIMINARY  PRELIMINARY  PRELIMINARY  Carotid Dopplers completed.    Preliminary report:  1-39% ICA stenosis, bilaterally.  Vertebral artery flow is antegrade.  Palpable lower extremity pulses.  Bergen Magner, RVT 08/09/2013, 9:53 AM

## 2013-08-09 NOTE — Progress Notes (Signed)
CARDIAC REHAB PHASE I   PRE:  Rate/Rhythm: 63  BP:  Supine: 95/32  Sitting:   Standing:    SaO2: 97 RA  MODE:  Ambulation:  ft   POST:  Rate/Rhythem:   BP:  Supine:   Sitting:   Standing:    SaO2:  10:20 am to 1050 am    Patient is on bedrest awaiting surgery in the am.  Pre Surgery education with patient.  He is encouraged and supported to ask any question or concerns.  He does not appear anxious or depressed.   Cathie Olden RN  Vinetta [place], Lavon Paganini

## 2013-08-10 ENCOUNTER — Encounter (HOSPITAL_COMMUNITY): Payer: Medicaid Other | Admitting: Anesthesiology

## 2013-08-10 ENCOUNTER — Encounter (HOSPITAL_COMMUNITY)
Admission: EM | Disposition: A | Discharge status: Home / Self Care | Payer: Medicaid Other | Source: Home / Self Care | Attending: Cardiothoracic Surgery

## 2013-08-10 ENCOUNTER — Inpatient Hospital Stay (HOSPITAL_COMMUNITY): Payer: Medicaid Other | Admitting: Anesthesiology

## 2013-08-10 ENCOUNTER — Encounter (HOSPITAL_COMMUNITY): Payer: Self-pay | Admitting: Certified Registered"

## 2013-08-10 ENCOUNTER — Inpatient Hospital Stay (HOSPITAL_COMMUNITY): Payer: Medicaid Other

## 2013-08-10 DIAGNOSIS — Z951 Presence of aortocoronary bypass graft: Secondary | ICD-10-CM

## 2013-08-10 DIAGNOSIS — I2510 Atherosclerotic heart disease of native coronary artery without angina pectoris: Secondary | ICD-10-CM

## 2013-08-10 HISTORY — DX: Presence of aortocoronary bypass graft: Z95.1

## 2013-08-10 HISTORY — PX: CORONARY ARTERY BYPASS GRAFT: SHX141

## 2013-08-10 HISTORY — PX: TEE WITHOUT CARDIOVERSION: SHX5443

## 2013-08-10 LAB — POCT I-STAT 4, (NA,K, GLUC, HGB,HCT)
Glucose, Bld: 109 mg/dL — ABNORMAL HIGH (ref 70–99)
Glucose, Bld: 80 mg/dL (ref 70–99)
Glucose, Bld: 91 mg/dL (ref 70–99)
Glucose, Bld: 123 mg/dL — ABNORMAL HIGH (ref 70–99)
Glucose, Bld: 111 mg/dL — ABNORMAL HIGH (ref 70–99)
HCT: 38 % — ABNORMAL LOW (ref 39.0–52.0)
HCT: 34 % — ABNORMAL LOW (ref 39.0–52.0)
HCT: 33 % — ABNORMAL LOW (ref 39.0–52.0)
HCT: 33 % — ABNORMAL LOW (ref 39.0–52.0)
Hemoglobin: 13.6 g/dL (ref 13.0–17.0)
Hemoglobin: 10.5 g/dL — ABNORMAL LOW (ref 13.0–17.0)
Hemoglobin: 12.9 g/dL — ABNORMAL LOW (ref 13.0–17.0)
Hemoglobin: 11.6 g/dL — ABNORMAL LOW (ref 13.0–17.0)
Hemoglobin: 11.2 g/dL — ABNORMAL LOW (ref 13.0–17.0)
Hemoglobin: 11.2 g/dL — ABNORMAL LOW (ref 13.0–17.0)
Hemoglobin: 11.9 g/dL — ABNORMAL LOW (ref 13.0–17.0)
Potassium: 4 mEq/L (ref 3.5–5.1)
Potassium: 4 mEq/L (ref 3.5–5.1)
Potassium: 5 mEq/L (ref 3.5–5.1)
Potassium: 3.7 mEq/L (ref 3.5–5.1)
Sodium: 143 mEq/L (ref 135–145)
Sodium: 143 mEq/L (ref 135–145)
Sodium: 140 mEq/L (ref 135–145)
Sodium: 145 mEq/L (ref 135–145)

## 2013-08-10 LAB — POCT I-STAT 3, ART BLOOD GAS (G3+)
Acid-base deficit: 1 mmol/L (ref 0.0–2.0)
Acid-base deficit: 5 mmol/L — ABNORMAL HIGH (ref 0.0–2.0)
Acid-base deficit: 4 mmol/L — ABNORMAL HIGH (ref 0.0–2.0)
Bicarbonate: 24.8 mEq/L — ABNORMAL HIGH (ref 20.0–24.0)
Bicarbonate: 25.6 mEq/L — ABNORMAL HIGH (ref 20.0–24.0)
Bicarbonate: 24.2 mEq/L — ABNORMAL HIGH (ref 20.0–24.0)
Bicarbonate: 21.2 mEq/L (ref 20.0–24.0)
Bicarbonate: 22.1 mEq/L (ref 20.0–24.0)
O2 Saturation: 100 %
O2 Saturation: 100 %
O2 Saturation: 98 %
O2 Saturation: 97 %
O2 Saturation: 98 %
Patient temperature: 34.9
Patient temperature: 37.6
Patient temperature: 38.1
Patient temperature: 38.2
TCO2: 26 mmol/L (ref 0–100)
TCO2: 27 mmol/L (ref 0–100)
TCO2: 26 mmol/L (ref 0–100)
TCO2: 25 mmol/L (ref 0–100)
TCO2: 22 mmol/L (ref 0–100)
TCO2: 23 mmol/L (ref 0–100)
TCO2: 24 mmol/L (ref 0–100)
pCO2 arterial: 48 mmHg — ABNORMAL HIGH (ref 35.0–45.0)
pCO2 arterial: 45.5 mmHg — ABNORMAL HIGH (ref 35.0–45.0)
pCO2 arterial: 51.9 mmHg — ABNORMAL HIGH (ref 35.0–45.0)
pH, Arterial: 7.321 — ABNORMAL LOW (ref 7.350–7.450)
pH, Arterial: 7.289 — ABNORMAL LOW (ref 7.350–7.450)
pH, Arterial: 7.284 — ABNORMAL LOW (ref 7.350–7.450)
pO2, Arterial: 428 mmHg — ABNORMAL HIGH (ref 80.0–100.0)
pO2, Arterial: 321 mmHg — ABNORMAL HIGH (ref 80.0–100.0)
pO2, Arterial: 110 mmHg — ABNORMAL HIGH (ref 80.0–100.0)
pO2, Arterial: 126 mmHg — ABNORMAL HIGH (ref 80.0–100.0)

## 2013-08-10 LAB — BASIC METABOLIC PANEL
BUN: 11 mg/dL (ref 6–23)
CO2: 26 mEq/L (ref 19–32)
Calcium: 8.5 mg/dL (ref 8.4–10.5)
Creatinine, Ser: 0.9 mg/dL (ref 0.50–1.35)
GFR calc non Af Amer: 88 mL/min — ABNORMAL LOW (ref >90)
Glucose, Bld: 104 mg/dL — ABNORMAL HIGH (ref 70–99)
Sodium: 142 mEq/L (ref 135–145)

## 2013-08-10 LAB — CBC
HCT: 39.6 % (ref 39.0–52.0)
HCT: 33.5 % — ABNORMAL LOW (ref 39.0–52.0)
HCT: 31.8 % — ABNORMAL LOW (ref 39.0–52.0)
Hemoglobin: 13.7 g/dL (ref 13.0–17.0)
Hemoglobin: 11.8 g/dL — ABNORMAL LOW (ref 13.0–17.0)
Hemoglobin: 11.1 g/dL — ABNORMAL LOW (ref 13.0–17.0)
MCH: 34.1 pg — ABNORMAL HIGH (ref 26.0–34.0)
MCH: 34.2 pg — ABNORMAL HIGH (ref 26.0–34.0)
MCH: 34.3 pg — ABNORMAL HIGH (ref 26.0–34.0)
MCHC: 34.6 g/dL (ref 30.0–36.0)
MCHC: 34.9 g/dL (ref 30.0–36.0)
MCV: 98.5 fL (ref 78.0–100.0)
MCV: 98.1 fL (ref 78.0–100.0)
Platelets: 157 10*3/uL (ref 150–400)
RBC: 4.02 MIL/uL — ABNORMAL LOW (ref 4.22–5.81)
RBC: 3.45 MIL/uL — ABNORMAL LOW (ref 4.22–5.81)
RBC: 3.24 MIL/uL — ABNORMAL LOW (ref 4.22–5.81)
RDW: 13.3 % (ref 11.5–15.5)
RDW: 13 % (ref 11.5–15.5)
WBC: 7.7 10*3/uL (ref 4.0–10.5)
WBC: 15.6 10*3/uL — ABNORMAL HIGH (ref 4.0–10.5)

## 2013-08-10 LAB — POCT I-STAT, CHEM 8
BUN: 7 mg/dL (ref 6–23)
Calcium, Ion: 1.26 mmol/L (ref 1.13–1.30)
Chloride: 110 mEq/L (ref 96–112)
Creatinine, Ser: 1 mg/dL (ref 0.50–1.35)
Glucose, Bld: 130 mg/dL — ABNORMAL HIGH (ref 70–99)
TCO2: 22 mmol/L (ref 0–100)

## 2013-08-10 LAB — GLUCOSE, CAPILLARY
Glucose-Capillary: 107 mg/dL — ABNORMAL HIGH (ref 70–99)
Glucose-Capillary: 109 mg/dL — ABNORMAL HIGH (ref 70–99)

## 2013-08-10 LAB — CREATININE, SERUM
Creatinine, Ser: 0.79 mg/dL (ref 0.50–1.35)
GFR calc Af Amer: >90 mL/min (ref >90)
GFR calc non Af Amer: >90 mL/min (ref >90)

## 2013-08-10 LAB — PROTIME-INR
INR: 1.34 (ref 0.00–1.49)
Prothrombin Time: 16.3 seconds — ABNORMAL HIGH (ref 11.6–15.2)

## 2013-08-10 LAB — MAGNESIUM: Magnesium: 2.4 mg/dL (ref 1.5–2.5)

## 2013-08-10 LAB — HEMOGLOBIN AND HEMATOCRIT, BLOOD
HCT: 33.6 % — ABNORMAL LOW (ref 39.0–52.0)
Hemoglobin: 11.8 g/dL — ABNORMAL LOW (ref 13.0–17.0)

## 2013-08-10 LAB — PLATELET COUNT: Platelets: 131 10*3/uL — ABNORMAL LOW (ref 150–400)

## 2013-08-10 LAB — APTT: aPTT: 35 seconds (ref 24–37)

## 2013-08-10 SURGERY — CORONARY ARTERY BYPASS GRAFTING (CABG)
Anesthesia: General | Site: Chest | Wound class: Clean

## 2013-08-10 MED ORDER — VANCOMYCIN HCL IN DEXTROSE 1-5 GM/200ML-% IV SOLN
1000.0000 mg | Freq: Once | INTRAVENOUS | Status: DC
  Filled 2013-08-10: qty 200

## 2013-08-10 MED ORDER — ONDANSETRON HCL 4 MG/2ML IJ SOLN
4.0000 mg | Freq: Four times a day (QID) | INTRAMUSCULAR | Status: DC | PRN
  Administered 2013-08-11: 4 mg via INTRAVENOUS
  Filled 2013-08-10: qty 2

## 2013-08-10 MED ORDER — SODIUM CHLORIDE 0.9 % IV SOLN: 250.0000 mL | INTRAVENOUS | Status: DC

## 2013-08-10 MED ORDER — NICOTINE 14 MG/24HR TD PT24
14.0000 mg | MEDICATED_PATCH | Freq: Every day | TRANSDERMAL | Status: DC
  Administered 2013-08-12: 14 mg via TRANSDERMAL
  Filled 2013-08-10 (×5): qty 1

## 2013-08-10 MED ORDER — MIDAZOLAM HCL 2 MG/2ML IJ SOLN: 2.0000 mg | INTRAMUSCULAR | Status: DC | PRN

## 2013-08-10 MED ORDER — MORPHINE SULFATE 2 MG/ML IJ SOLN: 1.0000 mg | INTRAMUSCULAR | Status: AC | PRN

## 2013-08-10 MED ORDER — PHENYLEPHRINE HCL 10 MG/ML IJ SOLN
0.0000 ug/min | INTRAVENOUS | Status: DC
  Administered 2013-08-10: 15 ug/min via INTRAVENOUS
  Administered 2013-08-11: 30 ug/min via INTRAVENOUS
  Filled 2013-08-10 (×3): qty 2

## 2013-08-10 MED ORDER — METOPROLOL TARTRATE 25 MG/10 ML ORAL SUSPENSION
12.5000 mg | Freq: Two times a day (BID) | ORAL | Status: DC
  Filled 2013-08-10 (×11): qty 5

## 2013-08-10 MED ORDER — ASPIRIN 81 MG PO CHEW
324.0000 mg | CHEWABLE_TABLET | Freq: Every day | ORAL | Status: DC
  Administered 2013-08-11: 324 mg
  Filled 2013-08-10: qty 4

## 2013-08-10 MED ORDER — SODIUM CHLORIDE 0.9 % IV SOLN: INTRAVENOUS | Status: DC

## 2013-08-10 MED ORDER — INSULIN ASPART 100 UNIT/ML ~~LOC~~ SOLN
0.0000 [IU] | SUBCUTANEOUS | Status: DC
  Administered 2013-08-10 – 2013-08-11 (×3): 2 [IU] via SUBCUTANEOUS

## 2013-08-10 MED ORDER — INSULIN REGULAR BOLUS VIA INFUSION
0.0000 [IU] | Freq: Three times a day (TID) | INTRAVENOUS | Status: DC
  Filled 2013-08-10: qty 10

## 2013-08-10 MED ORDER — ACETAMINOPHEN 650 MG RE SUPP: 650.0000 mg | Freq: Once | RECTAL | Status: AC

## 2013-08-10 MED ORDER — PANTOPRAZOLE SODIUM 40 MG PO TBEC: 40.0000 mg | DELAYED_RELEASE_TABLET | Freq: Every day | ORAL | Status: DC

## 2013-08-10 MED ORDER — 0.9 % SODIUM CHLORIDE (POUR BTL) OPTIME
TOPICAL | Status: DC | PRN
  Administered 2013-08-10: 1000 mL

## 2013-08-10 MED ORDER — MORPHINE SULFATE 2 MG/ML IJ SOLN
2.0000 mg | INTRAMUSCULAR | Status: DC | PRN
  Administered 2013-08-10: 2 mg via INTRAVENOUS
  Administered 2013-08-11: 4 mg via INTRAVENOUS
  Administered 2013-08-11: 2 mg via INTRAVENOUS
  Administered 2013-08-11: 4 mg via INTRAVENOUS
  Administered 2013-08-11: 2 mg via INTRAVENOUS
  Administered 2013-08-11 (×3): 4 mg via INTRAVENOUS
  Administered 2013-08-11 – 2013-08-12 (×2): 2 mg via INTRAVENOUS
  Administered 2013-08-12 (×3): 4 mg via INTRAVENOUS
  Filled 2013-08-10 (×2): qty 2
  Filled 2013-08-10 (×3): qty 1
  Filled 2013-08-10 (×5): qty 2
  Filled 2013-08-10 (×3): qty 1
  Filled 2013-08-10: qty 2
  Filled 2013-08-10: qty 1

## 2013-08-10 MED ORDER — LEVALBUTEROL HCL 0.63 MG/3ML IN NEBU
0.6300 mg | INHALATION_SOLUTION | Freq: Four times a day (QID) | RESPIRATORY_TRACT | Status: DC
  Administered 2013-08-10 – 2013-08-12 (×7): 0.63 mg via RESPIRATORY_TRACT
  Filled 2013-08-10 (×10): qty 3

## 2013-08-10 MED ORDER — HEMOSTATIC AGENTS (NO CHARGE) OPTIME
TOPICAL | Status: DC | PRN
  Administered 2013-08-10: 1 via TOPICAL

## 2013-08-10 MED ORDER — MIDAZOLAM HCL 5 MG/5ML IJ SOLN
INTRAMUSCULAR | Status: DC | PRN
  Administered 2013-08-10: 1 mg via INTRAVENOUS
  Administered 2013-08-10 (×3): 2 mg via INTRAVENOUS
  Administered 2013-08-10: 3 mg via INTRAVENOUS

## 2013-08-10 MED ORDER — POTASSIUM CHLORIDE 10 MEQ/50ML IV SOLN
10.0000 meq | INTRAVENOUS | Status: AC
  Administered 2013-08-10 (×3): 10 meq via INTRAVENOUS

## 2013-08-10 MED ORDER — LEVOFLOXACIN IN D5W 750 MG/150ML IV SOLN
750.0000 mg | INTRAVENOUS | Status: AC
  Administered 2013-08-11: 750 mg via INTRAVENOUS
  Filled 2013-08-10: qty 150

## 2013-08-10 MED ORDER — DOCUSATE SODIUM 100 MG PO CAPS
200.0000 mg | ORAL_CAPSULE | Freq: Every day | ORAL | Status: DC
  Administered 2013-08-11 – 2013-08-14 (×4): 200 mg via ORAL
  Filled 2013-08-10 (×5): qty 2

## 2013-08-10 MED ORDER — SODIUM CHLORIDE 0.9 % IV SOLN
20.0000 ug | Freq: Once | INTRAVENOUS | Status: AC
  Administered 2013-08-10: 20 ug via INTRAVENOUS
  Filled 2013-08-10 (×2): qty 5

## 2013-08-10 MED ORDER — ROCURONIUM BROMIDE 100 MG/10ML IV SOLN
INTRAVENOUS | Status: DC | PRN
  Administered 2013-08-10: 50 mg via INTRAVENOUS

## 2013-08-10 MED ORDER — BUDESONIDE-FORMOTEROL FUMARATE 160-4.5 MCG/ACT IN AERO
2.0000 | INHALATION_SPRAY | Freq: Two times a day (BID) | RESPIRATORY_TRACT | Status: DC
  Administered 2013-08-10 – 2013-08-14 (×7): 2 via RESPIRATORY_TRACT
  Filled 2013-08-10: qty 6

## 2013-08-10 MED ORDER — METOPROLOL TARTRATE 12.5 MG HALF TABLET
12.5000 mg | ORAL_TABLET | Freq: Two times a day (BID) | ORAL | Status: DC
  Administered 2013-08-12 – 2013-08-14 (×6): 12.5 mg via ORAL
  Filled 2013-08-10 (×11): qty 1

## 2013-08-10 MED ORDER — FAMOTIDINE IN NACL 20-0.9 MG/50ML-% IV SOLN
20.0000 mg | Freq: Two times a day (BID) | INTRAVENOUS | Status: AC
  Administered 2013-08-10: 20 mg via INTRAVENOUS
  Filled 2013-08-10: qty 50

## 2013-08-10 MED ORDER — NITROGLYCERIN IN D5W 200-5 MCG/ML-% IV SOLN
INTRAVENOUS | Status: DC | PRN
  Administered 2013-08-10: 10 ug/min via INTRAVENOUS

## 2013-08-10 MED ORDER — OXYCODONE HCL 5 MG PO TABS
5.0000 mg | ORAL_TABLET | ORAL | Status: DC | PRN
  Administered 2013-08-10 – 2013-08-14 (×8): 10 mg via ORAL
  Filled 2013-08-10 (×9): qty 2

## 2013-08-10 MED ORDER — ACETAMINOPHEN 160 MG/5ML PO SOLN
1000.0000 mg | Freq: Four times a day (QID) | ORAL | Status: DC
  Filled 2013-08-10: qty 40

## 2013-08-10 MED ORDER — SODIUM CHLORIDE 0.9 % IV SOLN
INTRAVENOUS | Status: DC
  Filled 2013-08-10: qty 1

## 2013-08-10 MED ORDER — HEPARIN SODIUM (PORCINE) 1000 UNIT/ML IJ SOLN
INTRAMUSCULAR | Status: DC | PRN
  Administered 2013-08-10: 5000 [IU] via INTRAVENOUS
  Administered 2013-08-10: 2000 [IU] via INTRAVENOUS
  Administered 2013-08-10: 18000 [IU] via INTRAVENOUS

## 2013-08-10 MED ORDER — SODIUM CHLORIDE 0.9 % IJ SOLN: 3.0000 mL | INTRAMUSCULAR | Status: DC | PRN

## 2013-08-10 MED ORDER — SODIUM CHLORIDE 0.9 % IV SOLN
10.0000 g | INTRAVENOUS | Status: DC | PRN
  Administered 2013-08-10: 5 g/h via INTRAVENOUS

## 2013-08-10 MED ORDER — ARTIFICIAL TEARS OP OINT
TOPICAL_OINTMENT | OPHTHALMIC | Status: DC | PRN
  Administered 2013-08-10: 1 via OPHTHALMIC

## 2013-08-10 MED ORDER — VANCOMYCIN HCL IN DEXTROSE 1-5 GM/200ML-% IV SOLN
1000.0000 mg | Freq: Two times a day (BID) | INTRAVENOUS | Status: AC
  Administered 2013-08-10 – 2013-08-11 (×2): 1000 mg via INTRAVENOUS
  Filled 2013-08-10 (×2): qty 200

## 2013-08-10 MED ORDER — BISACODYL 5 MG PO TBEC
10.0000 mg | DELAYED_RELEASE_TABLET | Freq: Every day | ORAL | Status: DC
  Administered 2013-08-11 – 2013-08-14 (×2): 10 mg via ORAL
  Filled 2013-08-10 (×2): qty 2

## 2013-08-10 MED ORDER — MAGNESIUM SULFATE 40 MG/ML IJ SOLN
4.0000 g | Freq: Once | INTRAMUSCULAR | Status: AC
  Administered 2013-08-10: 4 g via INTRAVENOUS
  Filled 2013-08-10: qty 100

## 2013-08-10 MED ORDER — ACETAMINOPHEN 160 MG/5ML PO SOLN
650.0000 mg | Freq: Once | ORAL | Status: AC
  Administered 2013-08-10: 650 mg

## 2013-08-10 MED ORDER — DEXMEDETOMIDINE HCL IN NACL 200 MCG/50ML IV SOLN
0.1000 ug/kg/h | INTRAVENOUS | Status: DC
  Administered 2013-08-10: 0.7 ug/kg/h via INTRAVENOUS
  Filled 2013-08-10: qty 50

## 2013-08-10 MED ORDER — LACTATED RINGERS IV SOLN
INTRAVENOUS | Status: DC | PRN
  Administered 2013-08-10 (×2): via INTRAVENOUS

## 2013-08-10 MED ORDER — NITROGLYCERIN IN D5W 200-5 MCG/ML-% IV SOLN: 0.0000 ug/min | INTRAVENOUS | Status: DC

## 2013-08-10 MED ORDER — BISACODYL 10 MG RE SUPP
10.0000 mg | Freq: Every day | RECTAL | Status: DC
  Administered 2013-08-12: 10 mg via RECTAL
  Filled 2013-08-10: qty 1

## 2013-08-10 MED ORDER — PROPOFOL 10 MG/ML IV BOLUS
INTRAVENOUS | Status: DC | PRN
  Administered 2013-08-10: 70 mg via INTRAVENOUS

## 2013-08-10 MED ORDER — GLYCOPYRROLATE 0.2 MG/ML IJ SOLN
INTRAMUSCULAR | Status: DC | PRN
  Administered 2013-08-10 (×2): 0.4 mg via INTRAVENOUS

## 2013-08-10 MED ORDER — LEVALBUTEROL HCL 0.63 MG/3ML IN NEBU: 0.6300 mg | INHALATION_SOLUTION | Freq: Three times a day (TID) | RESPIRATORY_TRACT | Status: DC

## 2013-08-10 MED ORDER — VECURONIUM BROMIDE 10 MG IV SOLR
INTRAVENOUS | Status: DC | PRN
  Administered 2013-08-10: 3 mg via INTRAVENOUS
  Administered 2013-08-10: 2 mg via INTRAVENOUS
  Administered 2013-08-10: 3 mg via INTRAVENOUS
  Administered 2013-08-10: 2 mg via INTRAVENOUS
  Administered 2013-08-10: 10 mg via INTRAVENOUS

## 2013-08-10 MED ORDER — ALBUMIN HUMAN 5 % IV SOLN
250.0000 mL | INTRAVENOUS | Status: AC | PRN
  Administered 2013-08-10 (×4): 250 mL via INTRAVENOUS
  Filled 2013-08-10: qty 250

## 2013-08-10 MED ORDER — PROTAMINE SULFATE 10 MG/ML IV SOLN
INTRAVENOUS | Status: DC | PRN
  Administered 2013-08-10: 30 mg via INTRAVENOUS
  Administered 2013-08-10 (×6): 50 mg via INTRAVENOUS

## 2013-08-10 MED ORDER — ACETAMINOPHEN 500 MG PO TABS
1000.0000 mg | ORAL_TABLET | Freq: Four times a day (QID) | ORAL | Status: DC
  Administered 2013-08-11 – 2013-08-15 (×13): 1000 mg via ORAL
  Filled 2013-08-10 (×21): qty 2

## 2013-08-10 MED ORDER — SODIUM CHLORIDE 0.45 % IV SOLN
INTRAVENOUS | Status: DC
  Administered 2013-08-10: 20 mL/h via INTRAVENOUS

## 2013-08-10 MED ORDER — SODIUM CHLORIDE 0.9 % IJ SOLN
3.0000 mL | Freq: Two times a day (BID) | INTRAMUSCULAR | Status: DC
  Administered 2013-08-11 – 2013-08-12 (×3): 3 mL via INTRAVENOUS

## 2013-08-10 MED ORDER — FENTANYL CITRATE 0.05 MG/ML IJ SOLN
INTRAMUSCULAR | Status: DC | PRN
  Administered 2013-08-10: 250 ug via INTRAVENOUS
  Administered 2013-08-10: 650 ug via INTRAVENOUS
  Administered 2013-08-10: 250 ug via INTRAVENOUS
  Administered 2013-08-10: 100 ug via INTRAVENOUS
  Administered 2013-08-10 (×2): 50 ug via INTRAVENOUS
  Administered 2013-08-10: 100 ug via INTRAVENOUS
  Administered 2013-08-10: 50 ug via INTRAVENOUS
  Administered 2013-08-10: 100 ug via INTRAVENOUS
  Administered 2013-08-10: 50 ug via INTRAVENOUS
  Administered 2013-08-10: 250 ug via INTRAVENOUS
  Administered 2013-08-10: 100 ug via INTRAVENOUS

## 2013-08-10 MED ORDER — DIPHENHYDRAMINE HCL 50 MG/ML IJ SOLN
INTRAMUSCULAR | Status: DC | PRN
  Administered 2013-08-10: 50 mg via INTRAVENOUS

## 2013-08-10 MED ORDER — METOPROLOL TARTRATE 1 MG/ML IV SOLN: 2.5000 mg | INTRAVENOUS | Status: DC | PRN

## 2013-08-10 MED ORDER — LACTATED RINGERS IV SOLN: INTRAVENOUS | Status: DC

## 2013-08-10 MED ORDER — ASPIRIN EC 325 MG PO TBEC
325.0000 mg | DELAYED_RELEASE_TABLET | Freq: Every day | ORAL | Status: DC
  Administered 2013-08-12 – 2013-08-14 (×3): 325 mg via ORAL
  Filled 2013-08-10 (×5): qty 1

## 2013-08-10 MED ORDER — DOPAMINE-DEXTROSE 3.2-5 MG/ML-% IV SOLN: 0.0000 ug/kg/min | INTRAVENOUS | Status: DC

## 2013-08-10 MED ORDER — ATORVASTATIN CALCIUM 80 MG PO TABS
80.0000 mg | ORAL_TABLET | Freq: Every day | ORAL | Status: DC
  Administered 2013-08-11 – 2013-08-13 (×3): 80 mg via ORAL
  Filled 2013-08-10 (×5): qty 1

## 2013-08-10 MED ORDER — LACTATED RINGERS IV SOLN: 500.0000 mL | Freq: Once | INTRAVENOUS | Status: DC | PRN

## 2013-08-10 SURGICAL SUPPLY — 104 items
ADAPTER CARDIO PERF ANTE/RETRO (ADAPTER) ×4 IMPLANT
ADPR PRFSN 84XANTGRD RTRGD (ADAPTER) ×2
ATTRACTOMAT 16X20 MAGNETIC DRP (DRAPES) ×4 IMPLANT
BAG DECANTER FOR FLEXI CONT (MISCELLANEOUS) ×4 IMPLANT
BANDAGE ELASTIC 4 VELCRO ST LF (GAUZE/BANDAGES/DRESSINGS) ×4 IMPLANT
BANDAGE ELASTIC 6 VELCRO ST LF (GAUZE/BANDAGES/DRESSINGS) ×4 IMPLANT
BANDAGE GAUZE 4  KLING STR (GAUZE/BANDAGES/DRESSINGS) ×3 IMPLANT
BANDAGE GAUZE ELAST BULKY 4 IN (GAUZE/BANDAGES/DRESSINGS) ×7 IMPLANT
BASKET HEART  (ORDER IN 25'S) (MISCELLANEOUS) ×1
BASKET HEART (ORDER IN 25'S) (MISCELLANEOUS) ×1
BASKET HEART (ORDER IN 25S) (MISCELLANEOUS) ×2 IMPLANT
BLADE STERNUM SYSTEM 6 (BLADE) ×4 IMPLANT
BLADE SURG 12 STRL SS (BLADE) ×4 IMPLANT
BLADE SURG ROTATE 9660 (MISCELLANEOUS) ×2 IMPLANT
CANISTER SUCTION 2500CC (MISCELLANEOUS) ×4 IMPLANT
CANNULA AORTIC HI-FLOW 6.5M20F (CANNULA) ×4 IMPLANT
CANNULA ARTERIAL NVNT 3/8 20FR (MISCELLANEOUS) ×4 IMPLANT
CANNULA GUNDRY RCSP 15FR (MISCELLANEOUS) ×4 IMPLANT
CANNULA VENOUS LOW PROF 32X40 (CANNULA) ×4 IMPLANT
CANNULA VENOUS LOW PROF 34X46 (CANNULA) ×4 IMPLANT
CANNULA VENOUS MAL SGL STG 40 (MISCELLANEOUS) IMPLANT
CANNULAE VENOUS MAL SGL STG 40 (MISCELLANEOUS)
CATH CPB KIT VANTRIGT (MISCELLANEOUS) ×4 IMPLANT
CATH ROBINSON RED A/P 18FR (CATHETERS) ×12 IMPLANT
CATH THORACIC 36FR RT ANG (CATHETERS) ×4 IMPLANT
CLIP TI WIDE RED SMALL 24 (CLIP) ×2 IMPLANT
COVER SURGICAL LIGHT HANDLE (MISCELLANEOUS) ×4 IMPLANT
CRADLE DONUT ADULT HEAD (MISCELLANEOUS) ×4 IMPLANT
DRAIN CHANNEL 32F RND 10.7 FF (WOUND CARE) ×4 IMPLANT
DRAPE CARDIOVASCULAR INCISE (DRAPES) ×4
DRAPE SLUSH/WARMER DISC (DRAPES) ×4 IMPLANT
DRAPE SRG 135X102X78XABS (DRAPES) ×2 IMPLANT
DRSG AQUACEL AG ADV 3.5X14 (GAUZE/BANDAGES/DRESSINGS) ×4 IMPLANT
ELECT BLADE 4.0 EZ CLEAN MEGAD (MISCELLANEOUS) ×4
ELECT BLADE 6.5 EXT (BLADE) ×4 IMPLANT
ELECT CAUTERY BLADE 6.4 (BLADE) ×4 IMPLANT
ELECT REM PT RETURN 9FT ADLT (ELECTROSURGICAL) ×8
ELECTRODE BLDE 4.0 EZ CLN MEGD (MISCELLANEOUS) ×2 IMPLANT
ELECTRODE REM PT RTRN 9FT ADLT (ELECTROSURGICAL) ×4 IMPLANT
FLOSEAL 10ML (HEMOSTASIS) ×4 IMPLANT
GLOVE BIO SURGEON STRL SZ 6.5 (GLOVE) ×6 IMPLANT
GLOVE BIO SURGEON STRL SZ7.5 (GLOVE) ×9 IMPLANT
GLOVE BIO SURGEONS STRL SZ 6.5 (GLOVE) ×3
GLOVE BIOGEL PI IND STRL 6 (GLOVE) IMPLANT
GLOVE BIOGEL PI IND STRL 7.0 (GLOVE) ×3 IMPLANT
GLOVE BIOGEL PI INDICATOR 6 (GLOVE) ×6
GLOVE BIOGEL PI INDICATOR 7.0 (GLOVE) ×6
GOWN PREVENTION PLUS XLARGE (GOWN DISPOSABLE) ×12 IMPLANT
GOWN STRL NON-REIN LRG LVL3 (GOWN DISPOSABLE) ×16 IMPLANT
HEMOSTAT POWDER SURGIFOAM 1G (HEMOSTASIS) ×12 IMPLANT
HEMOSTAT SURGICEL 2X14 (HEMOSTASIS) ×4 IMPLANT
INSERT FOGARTY XLG (MISCELLANEOUS) IMPLANT
KIT BASIN OR (CUSTOM PROCEDURE TRAY) ×4 IMPLANT
KIT ROOM TURNOVER OR (KITS) ×4 IMPLANT
KIT SUCTION CATH 14FR (SUCTIONS) ×4 IMPLANT
KIT VASOVIEW ACCESSORY VH 2004 (KITS) ×2 IMPLANT
KIT VASOVIEW W/TROCAR VH 2000 (KITS) ×4 IMPLANT
LEAD PACING MYOCARDI (MISCELLANEOUS) ×4 IMPLANT
MARKER GRAFT CORONARY BYPASS (MISCELLANEOUS) ×9 IMPLANT
NS IRRIG 1000ML POUR BTL (IV SOLUTION) ×20 IMPLANT
PACK OPEN HEART (CUSTOM PROCEDURE TRAY) ×4 IMPLANT
PAD ARMBOARD 7.5X6 YLW CONV (MISCELLANEOUS) ×8 IMPLANT
PAD ELECT DEFIB RADIOL ZOLL (MISCELLANEOUS) ×4 IMPLANT
PENCIL BUTTON HOLSTER BLD 10FT (ELECTRODE) ×4 IMPLANT
PUNCH AORTIC ROTATE 4.0MM (MISCELLANEOUS) ×3 IMPLANT
PUNCH AORTIC ROTATE 4.5MM 8IN (MISCELLANEOUS) IMPLANT
PUNCH AORTIC ROTATE 5MM 8IN (MISCELLANEOUS) IMPLANT
SET CARDIOPLEGIA MPS 5001102 (MISCELLANEOUS) ×3 IMPLANT
SPONGE GAUZE 4X4 12PLY (GAUZE/BANDAGES/DRESSINGS) ×8 IMPLANT
SPONGE LAP 18X18 X RAY DECT (DISPOSABLE) ×2 IMPLANT
SUT BONE WAX W31G (SUTURE) ×4 IMPLANT
SUT MNCRL AB 4-0 PS2 18 (SUTURE) IMPLANT
SUT PROLENE 3 0 SH DA (SUTURE) IMPLANT
SUT PROLENE 3 0 SH1 36 (SUTURE) IMPLANT
SUT PROLENE 4 0 RB 1 (SUTURE) ×4
SUT PROLENE 4 0 SH DA (SUTURE) ×4 IMPLANT
SUT PROLENE 4-0 RB1 .5 CRCL 36 (SUTURE) ×2 IMPLANT
SUT PROLENE 5 0 C 1 36 (SUTURE) IMPLANT
SUT PROLENE 6 0 C 1 30 (SUTURE) ×2 IMPLANT
SUT PROLENE 6 0 CC (SUTURE) ×18 IMPLANT
SUT PROLENE 7 0 DA (SUTURE) IMPLANT
SUT PROLENE 8 0 BV175 6 (SUTURE) IMPLANT
SUT PROLENE BLUE 7 0 (SUTURE) ×10 IMPLANT
SUT SILK  1 MH (SUTURE)
SUT SILK 1 MH (SUTURE) IMPLANT
SUT SILK 2 0 SH CR/8 (SUTURE) ×3 IMPLANT
SUT SILK 3 0 SH CR/8 (SUTURE) IMPLANT
SUT STEEL 6MS V (SUTURE) ×8 IMPLANT
SUT STEEL SZ 6 DBL 3X14 BALL (SUTURE) ×4 IMPLANT
SUT VIC AB 1 CTX 36 (SUTURE) ×8
SUT VIC AB 1 CTX36XBRD ANBCTR (SUTURE) ×4 IMPLANT
SUT VIC AB 2-0 CT1 27 (SUTURE) ×4
SUT VIC AB 2-0 CT1 TAPERPNT 27 (SUTURE) IMPLANT
SUT VIC AB 2-0 CTX 27 (SUTURE) IMPLANT
SUT VIC AB 3-0 X1 27 (SUTURE) ×2 IMPLANT
SUTURE E-PAK OPEN HEART (SUTURE) ×4 IMPLANT
SYSTEM SAHARA CHEST DRAIN ATS (WOUND CARE) ×4 IMPLANT
TAPE CLOTH SURG 4X10 WHT LF (GAUZE/BANDAGES/DRESSINGS) ×2 IMPLANT
TOWEL OR 17X24 6PK STRL BLUE (TOWEL DISPOSABLE) ×8 IMPLANT
TOWEL OR 17X26 10 PK STRL BLUE (TOWEL DISPOSABLE) ×8 IMPLANT
TRAY FOLEY IC TEMP SENS 14FR (CATHETERS) ×4 IMPLANT
TUBING INSUFFLATION 10FT LAP (TUBING) IMPLANT
UNDERPAD 30X30 INCONTINENT (UNDERPADS AND DIAPERS) ×4 IMPLANT
WATER STERILE IRR 1000ML POUR (IV SOLUTION) ×8 IMPLANT

## 2013-08-10 NOTE — Transfer of Care (Signed)
Immediate Anesthesia Transfer of Care Note  Patient: Erik Alvarado  Procedure(s) Performed: Procedure(s) with comments: CORONARY ARTERY BYPASS GRAFTING (CABG) X 4 using left internal mammary artery and bilateral saphenous vein (N/A) TRANSESOPHAGEAL ECHOCARDIOGRAM (TEE) (N/A) - intra- operative  Patient Location: SICU  Anesthesia Type:General  Level of Consciousness: sedated  Airway & Oxygen Therapy: Patient remains intubated per anesthesia plan and Patient placed on Ventilator (see vital sign flow sheet for setting)  Post-op Assessment: Report given to PACU RN and Post -op Vital signs reviewed and stable  Post vital signs: Reviewed and stable  Complications: No apparent anesthesia complications

## 2013-08-10 NOTE — Brief Op Note (Signed)
      [address]       [name] [address]             [phone]     08/08/2013 - 08/10/2013  12:58 PM  PATIENT:  Erik Alvarado  62 y.o. male  PRE-OPERATIVE DIAGNOSIS:  severe left main 90%  POST-OPERATIVE DIAGNOSIS:  coronary artery disease, severe left main.  PROCEDURE:  Procedure(s): CORONARY ARTERY BYPASS GRAFTING (CABG) X 4 using left internal mammary artery and bilateral saphenous vein TRANSESOPHAGEAL ECHOCARDIOGRAM (TEE) LIMA-LAD; SVG-PD; SEQ SVG-OM1-OM2  SURGEON:  Surgeon(s): Kerin Perna, MD  PHYSICIAN ASSISTANT: WAYNE GOLD PA-C  ANESTHESIA:   general  PATIENT CONDITION:  ICU - intubated and hemodynamically stable.  PRE-OPERATIVE WEIGHT: 78kg  COMPLICATIONS: NO KNOWN

## 2013-08-10 NOTE — Procedures (Signed)
Extubation Procedure Note  Patient Details:   Name: CAMILA NORVILLE DOB: 1950-10-17 MRN: [id number]   Airway Documentation:     Evaluation  O2 sats: stable throughout Complications: No apparent complications Patient did tolerate procedure well. Bilateral Breath Sounds: Diminished   Yes  NIF -24cmH20 FVC .700L  Pt following commands and had positive cuff leak prior to extubation.  ETT suctioned and deep oral suction performed prior to extubation.  Pt extubated to 4LNC.  Pt vocalizing post-extubation and no stridor noted.  Malachi Paradise 08/10/2013, 7:43 PM

## 2013-08-10 NOTE — OR Nursing (Signed)
1st call to SICU 1309.

## 2013-08-10 NOTE — Anesthesia Postprocedure Evaluation (Signed)
  Anesthesia Post-op Note  Patient: Erik Alvarado  Procedure(s) Performed: Procedure(s) with comments: CORONARY ARTERY BYPASS GRAFTING (CABG) X 4 using left internal mammary artery and bilateral saphenous vein (N/A) TRANSESOPHAGEAL ECHOCARDIOGRAM (TEE) (N/A) - intra- operative  Patient Location: SICU  Anesthesia Type:General  Level of Consciousness: Patient remains intubated per anesthesia plan  Airway and Oxygen Therapy: Patient remains intubated per anesthesia plan and Patient placed on Ventilator (see vital sign flow sheet for setting)  Post-op Pain: none  Post-op Assessment: Post-op Vital signs reviewed, Patient's Cardiovascular Status Stable, Respiratory Function Stable, Patent Airway, No signs of Nausea or vomiting and Pain level controlled  Post-op Vital Signs: stable  Complications: No apparent anesthesia complications

## 2013-08-10 NOTE — Progress Notes (Signed)
The patient was examined and preop studies reviewed. There has been no change from the prior exam and the patient is ready for surgery.   Plan CABG today on J Elling

## 2013-08-10 NOTE — Anesthesia Preprocedure Evaluation (Signed)
Anesthesia Evaluation  Patient identified by MRN, date of birth, ID band Patient awake    Reviewed: Allergy & Precautions, H&P , NPO status , Patient's Chart, lab work & pertinent test results  Airway       Dental   Pulmonary COPDCurrent Smoker,          Cardiovascular hypertension, + angina + CAD     Neuro/Psych    GI/Hepatic   Endo/Other    Renal/GU      Musculoskeletal   Abdominal   Peds  Hematology   Anesthesia Other Findings   Reproductive/Obstetrics                           Anesthesia Physical Anesthesia Plan  ASA: III  Anesthesia Plan: General   Post-op Pain Management:    Induction: Intravenous  Airway Management Planned: Oral ETT  Additional Equipment: Arterial line, CVP, PA Cath and TEE  Intra-op Plan:   Post-operative Plan: Post-operative intubation/ventilation  Informed Consent: I have reviewed the patients History and Physical, chart, labs and discussed the procedure including the risks, benefits and alternatives for the proposed anesthesia with the patient or authorized representative who has indicated his/her understanding and acceptance.     Plan Discussed with:   Anesthesia Plan Comments:         Anesthesia Quick Evaluation

## 2013-08-10 NOTE — OR Nursing (Signed)
2nd call to SICU 1345.

## 2013-08-10 NOTE — OR Nursing (Signed)
On the way call made to SICU.

## 2013-08-10 NOTE — Progress Notes (Signed)
CT surgery p.m. Rounds  Precedex weaned but still intubated following CABG x4 Stable hemodynamics History of COPD but lungs fairly clear Not bleeding Continue with postoperative management, vent wean as tolerated

## 2013-08-11 ENCOUNTER — Encounter (HOSPITAL_COMMUNITY): Payer: Self-pay | Admitting: Cardiothoracic Surgery

## 2013-08-11 ENCOUNTER — Inpatient Hospital Stay (HOSPITAL_COMMUNITY): Payer: Medicaid Other

## 2013-08-11 LAB — POCT I-STAT 3, ART BLOOD GAS (G3+)
Bicarbonate: 22.1 mEq/L (ref 20.0–24.0)
O2 Saturation: 98 %
Patient temperature: 37.4
TCO2: 23 mmol/L (ref 0–100)
pCO2 arterial: 41.7 mmHg (ref 35.0–45.0)
pH, Arterial: 7.333 — ABNORMAL LOW (ref 7.350–7.450)

## 2013-08-11 LAB — BASIC METABOLIC PANEL
BUN: 9 mg/dL (ref 6–23)
Chloride: 108 mEq/L (ref 96–112)
Creatinine, Ser: 0.78 mg/dL (ref 0.50–1.35)
GFR calc Af Amer: >90 mL/min (ref >90)
GFR calc non Af Amer: >90 mL/min (ref >90)

## 2013-08-11 LAB — CBC
HCT: 30.8 % — ABNORMAL LOW (ref 39.0–52.0)
MCH: 33.7 pg (ref 26.0–34.0)
MCHC: 34.5 g/dL (ref 30.0–36.0)
MCHC: 33.8 g/dL (ref 30.0–36.0)
MCV: 99.4 fL (ref 78.0–100.0)
MCV: 99.7 fL (ref 78.0–100.0)
Platelets: 154 10*3/uL (ref 150–400)
Platelets: 147 10*3/uL — ABNORMAL LOW (ref 150–400)
RBC: 3.18 MIL/uL — ABNORMAL LOW (ref 4.22–5.81)
RDW: 13.3 % (ref 11.5–15.5)
RDW: 13.1 % (ref 11.5–15.5)
WBC: 14.8 10*3/uL — ABNORMAL HIGH (ref 4.0–10.5)
WBC: 13 10*3/uL — ABNORMAL HIGH (ref 4.0–10.5)

## 2013-08-11 LAB — TYPE AND SCREEN
ABO/RH(D): A POS
Antibody Screen: NEGATIVE
Unit division: 0
Unit division: 0

## 2013-08-11 LAB — GLUCOSE, CAPILLARY
Glucose-Capillary: 104 mg/dL — ABNORMAL HIGH (ref 70–99)
Glucose-Capillary: 120 mg/dL — ABNORMAL HIGH (ref 70–99)
Glucose-Capillary: 124 mg/dL — ABNORMAL HIGH (ref 70–99)
Glucose-Capillary: 93 mg/dL (ref 70–99)
Glucose-Capillary: 98 mg/dL (ref 70–99)
Glucose-Capillary: 98 mg/dL (ref 70–99)

## 2013-08-11 LAB — CREATININE, SERUM: GFR calc non Af Amer: >90 mL/min (ref >90)

## 2013-08-11 LAB — MAGNESIUM
Magnesium: 1.9 mg/dL (ref 1.5–2.5)
Magnesium: 1.9 mg/dL (ref 1.5–2.5)

## 2013-08-11 LAB — PREPARE PLATELET PHERESIS: Unit division: 0

## 2013-08-11 MED ORDER — ENOXAPARIN SODIUM 40 MG/0.4ML ~~LOC~~ SOLN
40.0000 mg | Freq: Every day | SUBCUTANEOUS | Status: DC
  Administered 2013-08-11: 40 mg via SUBCUTANEOUS
  Filled 2013-08-11 (×2): qty 0.4

## 2013-08-11 MED ORDER — METOCLOPRAMIDE HCL 5 MG/ML IJ SOLN
10.0000 mg | Freq: Three times a day (TID) | INTRAMUSCULAR | Status: AC
Start: 2013-08-11 — End: 2013-08-12
  Administered 2013-08-11 – 2013-08-12 (×4): 10 mg via INTRAVENOUS
  Filled 2013-08-11 (×4): qty 2

## 2013-08-11 MED ORDER — PANTOPRAZOLE SODIUM 40 MG PO TBEC
40.0000 mg | DELAYED_RELEASE_TABLET | Freq: Two times a day (BID) | ORAL | Status: DC
  Administered 2013-08-12 – 2013-08-14 (×5): 40 mg via ORAL
  Filled 2013-08-11 (×5): qty 1

## 2013-08-11 MED ORDER — FUROSEMIDE 10 MG/ML IJ SOLN
20.0000 mg | Freq: Two times a day (BID) | INTRAMUSCULAR | Status: AC
  Administered 2013-08-11 (×2): 20 mg via INTRAVENOUS
  Filled 2013-08-11 (×2): qty 2

## 2013-08-11 MED ORDER — INSULIN ASPART 100 UNIT/ML ~~LOC~~ SOLN: 0.0000 [IU] | SUBCUTANEOUS | Status: DC

## 2013-08-11 MED ORDER — POTASSIUM CHLORIDE 10 MEQ/50ML IV SOLN
10.0000 meq | INTRAVENOUS | Status: AC
  Administered 2013-08-11 (×3): 10 meq via INTRAVENOUS

## 2013-08-11 MED ORDER — KETOROLAC TROMETHAMINE 30 MG/ML IJ SOLN
30.0000 mg | Freq: Four times a day (QID) | INTRAMUSCULAR | Status: AC
  Administered 2013-08-11 – 2013-08-12 (×4): 30 mg via INTRAVENOUS
  Filled 2013-08-11 (×4): qty 1

## 2013-08-11 MED ORDER — ALUM & MAG HYDROXIDE-SIMETH 200-200-20 MG/5ML PO SUSP
30.0000 mL | ORAL | Status: DC | PRN
  Administered 2013-08-11: 30 mL via ORAL
  Filled 2013-08-11: qty 30

## 2013-08-11 MED ORDER — ALBUMIN HUMAN 5 % IV SOLN
12.5000 g | Freq: Once | INTRAVENOUS | Status: AC
  Administered 2013-08-11: 12.5 g via INTRAVENOUS
  Filled 2013-08-11: qty 250

## 2013-08-11 MED ORDER — INSULIN DETEMIR 100 UNIT/ML ~~LOC~~ SOLN
15.0000 [IU] | Freq: Every day | SUBCUTANEOUS | Status: DC
  Filled 2013-08-11: qty 0.15

## 2013-08-11 MED ORDER — POTASSIUM CHLORIDE 10 MEQ/50ML IV SOLN
INTRAVENOUS | Status: AC
  Filled 2013-08-11: qty 150

## 2013-08-11 MED ORDER — INSULIN DETEMIR 100 UNIT/ML ~~LOC~~ SOLN
15.0000 [IU] | Freq: Once | SUBCUTANEOUS | Status: AC
  Administered 2013-08-11: 15 [IU] via SUBCUTANEOUS
  Filled 2013-08-11: qty 0.15

## 2013-08-11 NOTE — Progress Notes (Signed)
1 Day Post-Op Procedure(s) (LRB): CORONARY ARTERY BYPASS GRAFTING (CABG) X 4 using left internal mammary artery and bilateral saphenous vein (N/A) TRANSESOPHAGEAL ECHOCARDIOGRAM (TEE) (N/A) Subjective: Some incisional pain- required a lot of narcotics over night Denies nausea  Objective: Vital signs in last 24 hours: Temp:  [94.6 F (34.8 Alvarado)-100.8 F (38.2 Alvarado)] 99.5 F (37.5 Alvarado) (12/01 0600) Pulse Rate:  [44-82] 78 (12/01 0600) Cardiac Rhythm:  [-] A-V Sequential paced (12/01 0700) Resp:  [6-19] 14 (12/01 0600) BP: (90-144)/(52-101) 102/60 mmHg (12/01 0600) SpO2:  [97 %-100 %] 100 % (12/01 0600) Arterial Line BP: (80-162)/(48-94) 111/55 mmHg (12/01 0600) FiO2 (%):  [40 %-50 %] 40 % (11/30 1855) Weight:  [179 lb 0.2 oz (81.2 kg)] 179 lb 0.2 oz (81.2 kg) (12/01 0500)  Hemodynamic parameters for last 24 hours: PAP: (17-35)/(8-19) 23/11 mmHg CO:  [3.2 L/min-5.9 L/min] 5.7 L/min CI:  [1.6 L/min/m2-3 L/min/m2] 2.8 L/min/m2  Intake/Output from previous day: 11/30 0701 - 12/01 0700 In: 6224 [P.O.:450; I.V.:3567; Blood:627; NG/GT:30; IV Piggyback:1550] Out: 5755 [Urine:4505; Blood:700; Chest Tube:550] Intake/Output this shift:    General appearance: alert and no distress Neurologic: intact Heart: slightly irregular Lungs: diminished breath sounds bibasilar Abdomen: mildly distended, nontender  Lab Results:  Recent Labs  08/10/13 2030 08/10/13 2035 08/11/13 0435  WBC 15.6*  --  14.8*  HGB 11.1* 11.2* 10.9*  HCT 31.8* 33.0* 31.6*  PLT 157  --  154   BMET:  Recent Labs  08/10/13 0558  08/10/13 2035 08/11/13 0435  NA 142  < > 145 140  K 4.4  < > 4.3 4.3  CL 109  --  110 108  CO2 26  --   --  23  GLUCOSE 104*  < > 130* 127*  BUN 11  --  7 9  CREATININE 0.90  < > 1.00 0.78  CALCIUM 8.5  --   --  8.0*  < > = values in this interval not displayed.  PT/INR:  Recent Labs  08/10/13 1430  LABPROT 16.3*  INR 1.34   ABG    Component Value Date/Time   PHART 7.333*  08/11/2013 0438   HCO3 22.1 08/11/2013 0438   TCO2 23 08/11/2013 0438   ACIDBASEDEF 4.0* 08/11/2013 0438   O2SAT 98.0 08/11/2013 0438   CBG (last 3)   Recent Labs  08/10/13 1722 08/10/13 2035 08/10/13 2356  GLUCAP 109* 133* 104*    Assessment/Plan: S/P Procedure(s) (LRB): CORONARY ARTERY BYPASS GRAFTING (CABG) X 4 using left internal mammary artery and bilateral saphenous vein (N/A) TRANSESOPHAGEAL ECHOCARDIOGRAM (TEE) (N/A) POD # 1 CABG x 4 CV- good hemodynamics- dc swan  Sinus brady in 58s- issues with intermittent capture with pacer- continue DDD for now  Hold lopressor this AM  RESP_ some linear atelectasis on CXR- IS  RENAL- weight up, filling pressures OK  Diurese as BP tolerates  ENDO- CBG well controlled  Anemia secondary to ABL- mild, follow  DC CT  SCD + enoxaparin for DVT prophylaxis  OOB, ambulate  Will give toradol for 24 hours to help with pain management   LOS: 3 days    Erik Alvarado 08/11/2013

## 2013-08-11 NOTE — Progress Notes (Signed)
PM rounds  Just back from a walk  Feels tired. C/o heartburn- started about 2 hours ago  BP 109/66  Pulse 91  Temp(Src) 97.5 F (36.4 C) (Oral)  Resp 17  Ht 6' (1.829 m)  Wt 179 lb 0.2 oz (81.2 kg)  BMI 24.27 kg/m2  SpO2 100%  Phenylephrine down to 10 mcg   Intake/Output Summary (Last 24 hours) at 08/11/13 1817 Last data filed at 08/11/13 1600  Gross per 24 hour  Intake 2737.45 ml  Output   2510 ml  Net 227.45 ml    Doing well POD # 1  Continue to wean neo  Says heartburn is different from his angina- will try maalox

## 2013-08-11 NOTE — Progress Notes (Signed)
UR Completed.  Vangie Bicker [phone] 08/11/2013

## 2013-08-11 NOTE — Op Note (Signed)
[name], Alvarado NO.:  [id number]  MEDICAL RECORD NO.:  [id number]  LOCATION:  2S14C                        FACILITY:  [facility]  PHYSICIAN:  Erik Alvarado, M.D.  DATE OF BIRTH:  04-Aug-1951  DATE OF PROCEDURE:  08/10/2013 DATE OF DISCHARGE:                              OPERATIVE REPORT   OPERATION: 1. Coronary artery bypass grafting x4 (left internal mammary artery to     LAD, sequential saphenous vein graft to OM1, OM2, saphenous vein     graft to posterior descending). 2. Endoscopic harvest of bilateral greater saphenous vein.  PREOPERATIVE DIAGNOSIS:  A 90-95% left main stenosis with severe three- vessel coronary artery disease and unstable angina.  POSTOPERATIVE DIAGNOSIS:  A 90-95% left main stenosis with severe three- vessel coronary artery disease and unstable angina.  SURGEON:  Erik Alvarado, M.D.  ASSISTANT:  Erik Clack, PA-C  ANESTHESIA:  General by Dr. Laverle Hobby.  INDICATIONS:  The patient is a 62 year old diabetic male with history of CAD, treated with stents over the past 8 years.  He presented with recurrent severe nocturnal angina with minimal elevation in cardiac enzymes.  Urgent cardiac catheterization, however, demonstrated a 90-95% left main stenosis.  He had progression of disease in his previously stented vessels as well with preserved LV function.  He was felt to be a candidate for surgical coronary revascularization.  I examined the patient in his CCU room and reviewed the results of the cardiac catheterization with the patient and family.  I discussed the procedure of CABG for treatment of his severe left main and three-vessel CAD.  I discussed the major aspects of surgery including the use of general anesthesia and cardiopulmonary bypass, the expected postoperative hospital recovery, and the potential risks of bleeding, stroke, MI, arrhythmia, wound infection, pleural effusion, or death.  The patient understood that  due to his active smoking and severe emphysema-COPD he would be at increased risk for postoperative pulmonary problems including effusions, oxygen dependence, pneumonia.  After reviewing all these issues, he demonstrated his understanding and agreed to proceed with the surgery under what I felt was an informed consent.  OPERATIVE FINDINGS: 1. Adequate saphenous vein in the right leg to the knee and adequate     saphenous vein harvest from the left leg.  The patient has no     further saphenous vein left for future surgery. 2. No blood, no packed cell transfusion required for this operation. 3. Small coronary targets.  OPERATIVE PROCEDURE:  The patient was brought to the operating room and placed supine on the operating room table, where general anesthesia was induced under invasive hemodynamic monitoring.  The chest, abdomen, and legs were prepped with Betadine and draped as a sterile field.  A transesophageal echo probe was placed by the anesthesiologist.  This confirmed the preoperative diagnosis of CAD without significant LV dysfunction or valvular disease.  A sternal incision was made as the saphenous vein was harvested endoscopically from the right leg  first and then the left leg.  The left internal mammary artery was harvested as a pedicle graft from its origin at the subclavian vessels.  He had a good flow and was 1.5-mm  vessel.  The sternal retractor was placed and the pericardium was opened and suspended.  The patient had severe emphysema with overlap of the lungs anterior to the heart.  Heparin was administered and ACT was documented as being therapeutic. Pursestrings were placed in the ascending aorta and right atrium, and the patient was cannulated and placed on cardiopulmonary bypass.  The coronary arteries were identified for grafting and the mammary artery and vein grafts were prepared for the distal anastomoses.  Cardioplegic cannulas were placed for both antegrade  and retrograde cold blood cardioplegia.  The mammary artery and vein grafts were prepared for the distal anastomoses.  The patient was cooled to 32 degrees and the aortic crossclamp was applied.  One liter of cold blood cardioplegia was delivered in split doses between the antegrade aortic and a retrograde coronary sinus catheters.  There was good cardioplegic arrest and septal temperature dropped less than 12 degrees.  Cardioplegia was delivered every 20 minutes while the crossclamp was in place.  The first distal anastomosis was to the posterior descending branch of the right coronary.  This was placed fairly distally.  The vessel was 1.5 mm.  A reverse saphenous vein was sewn end-to-side with running 7-0 Prolene with good flow through the graft.  The second distal anastomosis was the first part of the sequential vein graft to the OM1 and OM2.  The OM1 was a 1.5 mm vessel, proximal 80% stenosis.  The vein was sewn side-to-side with running 7-0 Prolene with good flow through the graft.  The third distal anastomosis was the continuation of the sequential vein graft to the OM2.  This was a 1.5 mm vessel, proximal 80% stenosis.  The end of the vein was sewn end-to-side with running 7-0 Prolene with good flow through the graft.  Cardioplegia was redosed.  The fourth distal anastomosis was to the mid portion of the LAD.  It was 1.5 mm vessel with proximal 90% stenosis.  The left IMA pedicle was brought through an opening created in the left lateral pericardium, was brought down onto the LAD and sewn end-to-side with running 8-0 Prolene. There was good flow through the anastomosis after briefly releasing the bulldog on the pedicle and the bulldog was reapplied and the pedicle secured to the epicardium with 6-0 Prolene.  Cardioplegia was redosed.  While the crossclamp was still in place, 2 proximal vein anastomoses were performed on the ascending aorta using a 4.0 mm punch running  6-0 Prolene.  Prior to tying down the final proximal anastomosis, air was vented from the coronaries with a dose of retrograde warm blood cardioplegia.  The crossclamp was then removed.  It was cardioverted back to a regular rhythm.  The vein grafts were de- aired and opened and each had good flow and hemostasis was documented at the proximal and distal anastomoses.  The cardioplegic cannula was removed.  The patient was rewarmed and reperfused.  Temporary pacing wires were applied.  The lungs re-expanded.  The ventilator was resumed.  The patient was then weaned off cardiopulmonary bypass without the press orders. Cardiac output and hemodynamics were stable.  A 2D echo showed normal LV function.  Protamine was administered without adverse reaction.  There was a transient drop in systolic blood pressure to 80 and then that returned to normal.  The mediastinum was irrigated and hemostasis was achieved.  The superior pericardial fat was closed over the aorta.  The anterior mediastinal and the left pleural chest tube were placed and brought through  separate incisions.  The sternum was closed with wire.  The pectoralis fascia was closed in running #1 Vicryl.  The subcutaneous and skin layers were closed in running Vicryl and sterile dressings were applied.  Total cardiopulmonary bypass time was 130 minutes.     Erik Alvarado, M.D.     PV/MEDQ  D:  08/10/2013  T:  08/11/2013  Job:  [id number]  cc:   Veverly Fells. Excell Seltzer, MD

## 2013-08-11 NOTE — Progress Notes (Signed)
I-stat done at 1600 did not cross over to Epic results are: Na-137; K-3.6; Cl-106; Ca-1.31  Followed protocol and 3 runs of K ordered and started.  Erik Alvarado

## 2013-08-12 ENCOUNTER — Inpatient Hospital Stay (HOSPITAL_COMMUNITY): Payer: Medicaid Other

## 2013-08-12 DIAGNOSIS — Z951 Presence of aortocoronary bypass graft: Secondary | ICD-10-CM

## 2013-08-12 LAB — BASIC METABOLIC PANEL
BUN: 13 mg/dL (ref 6–23)
Chloride: 102 mEq/L (ref 96–112)
GFR calc Af Amer: >90 mL/min (ref >90)
GFR calc non Af Amer: >90 mL/min (ref >90)
Potassium: 3.8 mEq/L (ref 3.5–5.1)
Sodium: 136 mEq/L (ref 135–145)

## 2013-08-12 LAB — CBC
HCT: 29.2 % — ABNORMAL LOW (ref 39.0–52.0)
Hemoglobin: 9.8 g/dL — ABNORMAL LOW (ref 13.0–17.0)
MCHC: 33.6 g/dL (ref 30.0–36.0)
RBC: 2.91 MIL/uL — ABNORMAL LOW (ref 4.22–5.81)
WBC: 12.4 10*3/uL — ABNORMAL HIGH (ref 4.0–10.5)

## 2013-08-12 LAB — GLUCOSE, CAPILLARY
Glucose-Capillary: 97 mg/dL (ref 70–99)
Glucose-Capillary: 98 mg/dL (ref 70–99)

## 2013-08-12 MED ORDER — ZOLPIDEM TARTRATE 5 MG PO TABS
5.0000 mg | ORAL_TABLET | Freq: Every evening | ORAL | Status: DC | PRN
  Administered 2013-08-15: 5 mg via ORAL
  Filled 2013-08-12: qty 1

## 2013-08-12 MED ORDER — SODIUM CHLORIDE 0.9 % IJ SOLN: 3.0000 mL | INTRAMUSCULAR | Status: DC | PRN

## 2013-08-12 MED ORDER — ALPRAZOLAM 0.25 MG PO TABS: 0.2500 mg | ORAL_TABLET | Freq: Four times a day (QID) | ORAL | Status: DC | PRN

## 2013-08-12 MED ORDER — METOCLOPRAMIDE HCL 5 MG/ML IJ SOLN
10.0000 mg | Freq: Four times a day (QID) | INTRAMUSCULAR | Status: AC
  Administered 2013-08-12: 10 mg via INTRAVENOUS
  Filled 2013-08-12 (×4): qty 2

## 2013-08-12 MED ORDER — FUROSEMIDE 40 MG PO TABS
40.0000 mg | ORAL_TABLET | Freq: Every day | ORAL | Status: DC
  Administered 2013-08-12 – 2013-08-14 (×3): 40 mg via ORAL
  Filled 2013-08-12 (×4): qty 1

## 2013-08-12 MED ORDER — POTASSIUM CHLORIDE 10 MEQ/50ML IV SOLN
10.0000 meq | INTRAVENOUS | Status: AC
  Administered 2013-08-12 (×2): 10 meq via INTRAVENOUS
  Filled 2013-08-12: qty 50

## 2013-08-12 MED ORDER — LEVALBUTEROL HCL 0.63 MG/3ML IN NEBU
0.6300 mg | INHALATION_SOLUTION | Freq: Four times a day (QID) | RESPIRATORY_TRACT | Status: DC
  Administered 2013-08-12 – 2013-08-13 (×5): 0.63 mg via RESPIRATORY_TRACT
  Filled 2013-08-12 (×19): qty 3

## 2013-08-12 MED ORDER — SODIUM CHLORIDE 0.9 % IJ SOLN
3.0000 mL | Freq: Two times a day (BID) | INTRAMUSCULAR | Status: DC
  Administered 2013-08-12 – 2013-08-14 (×5): 3 mL via INTRAVENOUS

## 2013-08-12 MED ORDER — SODIUM CHLORIDE 0.9 % IV SOLN: 250.0000 mL | INTRAVENOUS | Status: DC | PRN

## 2013-08-12 MED ORDER — MOVING RIGHT ALONG BOOK
Freq: Once | Status: AC
  Administered 2013-08-12: 18:00:00
  Filled 2013-08-12: qty 1

## 2013-08-12 MED ORDER — POTASSIUM CHLORIDE CRYS ER 20 MEQ PO TBCR
20.0000 meq | EXTENDED_RELEASE_TABLET | Freq: Two times a day (BID) | ORAL | Status: DC
  Administered 2013-08-12 – 2013-08-14 (×5): 20 meq via ORAL
  Filled 2013-08-12 (×8): qty 1

## 2013-08-12 MED ORDER — TRAMADOL HCL 50 MG PO TABS
50.0000 mg | ORAL_TABLET | Freq: Four times a day (QID) | ORAL | Status: DC | PRN
  Administered 2013-08-12: 100 mg via ORAL
  Filled 2013-08-12: qty 2

## 2013-08-12 MED ORDER — MAGNESIUM HYDROXIDE 400 MG/5ML PO SUSP: 30.0000 mL | Freq: Every day | ORAL | Status: DC | PRN

## 2013-08-12 MED ORDER — GUAIFENESIN-DM 100-10 MG/5ML PO SYRP: 15.0000 mL | ORAL_SOLUTION | ORAL | Status: DC | PRN

## 2013-08-12 MED FILL — Lidocaine HCl IV Inj 20 MG/ML: INTRAVENOUS | Qty: 5 | Status: AC

## 2013-08-12 MED FILL — Electrolyte-R (PH 7.4) Solution: INTRAVENOUS | Qty: 3000 | Status: AC

## 2013-08-12 MED FILL — Sodium Bicarbonate IV Soln 8.4%: INTRAVENOUS | Qty: 50 | Status: AC

## 2013-08-12 MED FILL — Mannitol IV Soln 20%: INTRAVENOUS | Qty: 500 | Status: AC

## 2013-08-12 MED FILL — Sodium Chloride Irrigation Soln 0.9%: Qty: 3000 | Status: AC

## 2013-08-12 MED FILL — Heparin Sodium (Porcine) Inj 1000 Unit/ML: INTRAMUSCULAR | Qty: 2 | Status: AC

## 2013-08-12 NOTE — Progress Notes (Signed)
1450 Came to walk with pt. Pt declined due to back pain and shooting pain down legs. Pt told me he either walks, stands or lays in bed at home but does not sit for any length of time. Stated he had been in recliner for hours and now his back was hurting badly. Stated he had gotten pain med but his back needed to rest. Encouraged pt to walk with staff later. Duanne Limerick, RN 08/12/2013 2:47 PM

## 2013-08-12 NOTE — Progress Notes (Signed)
     SUBJECTIVE: Chest is sore.   Tele: Sinus  BP 109/55  Pulse 75  Temp(Src) 97.6 F (36.4 C) (Oral)  Resp 14  Ht 6' (1.829 m)  Wt 179 lb 11.2 oz (81.511 kg)  BMI 24.37 kg/m2  SpO2 98%  Intake/Output Summary (Last 24 hours) at 08/12/13 1145 Last data filed at 08/12/13 0947  Gross per 24 hour  Intake 988.85 ml  Output   1765 ml  Net -776.15 ml    PHYSICAL EXAM General: Well developed, well nourished, in no acute distress. Alert and oriented x 3.  Psych:  Good affect, responds appropriately Neck: No JVD. No masses noted.  Lungs: Clear bilaterally with no wheezes or rhonci noted.  Heart: RRR with no murmurs noted. Abdomen: Bowel sounds are present. Soft, non-tender.  Extremities: No lower extremity edema.   LABS: Basic Metabolic Panel:  Recent Labs  16/10/96 0435 08/11/13 1500 08/12/13 0325  NA 140  --  136  K 4.3  --  3.8  CL 108  --  102  CO2 23  --  25  GLUCOSE 127*  --  113*  BUN 9  --  13  CREATININE 0.78 0.75 0.72  CALCIUM 8.0*  --  8.1*  MG 1.9 1.9  --    CBC:  Recent Labs  08/11/13 1500 08/12/13 0325  WBC 13.0* 12.4*  HGB 10.4* 9.8*  HCT 30.8* 29.2*  MCV 99.7 100.3*  PLT 147* 108*   Current Meds: . acetaminophen  1,000 mg Oral Q6H   Or  . acetaminophen (TYLENOL) oral liquid 160 mg/5 mL  1,000 mg Per Tube Q6H  . aspirin EC  325 mg Oral Daily   Or  . aspirin  324 mg Per Tube Daily  . atorvastatin  80 mg Oral q1800  . bisacodyl  10 mg Oral Daily   Or  . bisacodyl  10 mg Rectal Daily  . budesonide-formoterol  2 puff Inhalation BID  . docusate sodium  200 mg Oral Daily  . furosemide  40 mg Oral Daily  . levalbuterol  0.63 mg Nebulization Q6H  . metoCLOPramide (REGLAN) injection  10 mg Intravenous Q6H  . metoprolol tartrate  12.5 mg Oral BID   Or  . metoprolol tartrate  12.5 mg Per Tube BID  . nicotine  14 mg Transdermal Daily  . pantoprazole  40 mg Oral BID AC  . potassium chloride  20 mEq Oral BID  . sodium chloride  3 mL  Intravenous Q12H     ASSESSMENT AND PLAN:  1. CAD/Unstable angina: Pt admitted 08/08/13 with unstable angina. Cardiac cath with critical left main stenosis, severe RCA stenosis. S/p 4V CABG 08/10/13. Stable on post-op day 2.  Sinus rhythm. Continue ASA, statin, beta blocker.   MCALHANY,CHRISTOPHER  12/2/201411:45 AM

## 2013-08-12 NOTE — Progress Notes (Addendum)
2 Days Post-Op Procedure(s) (LRB): CORONARY ARTERY BYPASS GRAFTING (CABG) X 4 using left internal mammary artery and bilateral saphenous vein (N/A) TRANSESOPHAGEAL ECHOCARDIOGRAM (TEE) (N/A) Subjective: Some incisional pain, intermittent cough Denies nausea, heartburn resolved  Objective: Vital signs in last 24 hours: Temp:  [97.5 F (36.4 Alvarado)-99.7 F (37.6 Alvarado)] 97.6 F (36.4 Alvarado) (12/02 0742) Pulse Rate:  [37-95] 90 (12/02 0700) Cardiac Rhythm:  [-] A-V Sequential paced (12/02 0700) Resp:  [10-18] 18 (12/02 0700) BP: (83-109)/(53-69) 98/66 mmHg (12/02 0700) SpO2:  [74 %-100 %] 92 % (12/02 0700) Arterial Line BP: (96-145)/(39-70) 121/50 mmHg (12/02 0700) Weight:  [179 lb 11.2 oz (81.511 kg)] 179 lb 11.2 oz (81.511 kg) (12/02 0500)  Hemodynamic parameters for last 24 hours: PAP: (21-25)/(11-12) 25/12 mmHg  Intake/Output from previous day: 12/01 0701 - 12/02 0700 In: 1436.5 [P.O.:200; I.V.:736.5; IV Piggyback:500] Out: 2105 [Urine:1945; Chest Tube:160] Intake/Output this shift:    General appearance: alert and no distress Neurologic: intact Heart: regular rate and rhythm Lungs: diminished breath sounds bibasilar Abdomen: mildly distended, hypoactive BS  Lab Results:  Recent Labs  08/11/13 1500 08/12/13 0325  WBC 13.0* 12.4*  HGB 10.4* 9.8*  HCT 30.8* 29.2*  PLT 147* 108*   BMET:  Recent Labs  08/11/13 0435 08/11/13 1500 08/12/13 0325  NA 140  --  136  K 4.3  --  3.8  CL 108  --  102  CO2 23  --  25  GLUCOSE 127*  --  113*  BUN 9  --  13  CREATININE 0.78 0.75 0.72  CALCIUM 8.0*  --  8.1*    PT/INR:  Recent Labs  08/10/13 1430  LABPROT 16.3*  INR 1.34   ABG    Component Value Date/Time   PHART 7.333* 08/11/2013 0438   HCO3 22.1 08/11/2013 0438   TCO2 23 08/11/2013 0438   ACIDBASEDEF 4.0* 08/11/2013 0438   O2SAT 98.0 08/11/2013 0438   CBG (last 3)   Recent Labs  08/11/13 2010 08/11/13 2335 08/12/13 0324  GLUCAP 98 104* 97     Assessment/Plan: S/P Procedure(s) (LRB): CORONARY ARTERY BYPASS GRAFTING (CABG) X 4 using left internal mammary artery and bilateral saphenous vein (N/A) TRANSESOPHAGEAL ECHOCARDIOGRAM (TEE) (N/A) Plan for transfer to step-down: see transfer orders POD # 2  CV- stable- off phenylephrine, in SR at 80  RESP- continue IS  RENAL- creatinine OK, diurese as BP allows, supplement K  ENDO - CBG well controlled  Thrombocytopenia- PLT down from 147 to 108- dc enoxaparin  Check HIT panel  Transfer to 2000   LOS: 4 days    Erik Alvarado 08/12/2013

## 2013-08-13 ENCOUNTER — Inpatient Hospital Stay (HOSPITAL_COMMUNITY): Payer: Medicaid Other

## 2013-08-13 DIAGNOSIS — I2510 Atherosclerotic heart disease of native coronary artery without angina pectoris: Principal | ICD-10-CM

## 2013-08-13 HISTORY — DX: Atherosclerotic heart disease of native coronary artery without angina pectoris: I25.10

## 2013-08-13 LAB — BASIC METABOLIC PANEL
BUN: 14 mg/dL (ref 6–23)
CO2: 28 mEq/L (ref 19–32)
Calcium: 8.2 mg/dL — ABNORMAL LOW (ref 8.4–10.5)
Chloride: 103 mEq/L (ref 96–112)
Creatinine, Ser: 0.83 mg/dL (ref 0.50–1.35)
GFR calc non Af Amer: >90 mL/min (ref >90)
Glucose, Bld: 130 mg/dL — ABNORMAL HIGH (ref 70–99)

## 2013-08-13 LAB — CBC
HCT: 26.5 % — ABNORMAL LOW (ref 39.0–52.0)
Hemoglobin: 9 g/dL — ABNORMAL LOW (ref 13.0–17.0)
MCH: 34 pg (ref 26.0–34.0)
MCHC: 34 g/dL (ref 30.0–36.0)
MCV: 100 fL (ref 78.0–100.0)
Platelets: 109 10*3/uL — ABNORMAL LOW (ref 150–400)
RBC: 2.65 MIL/uL — ABNORMAL LOW (ref 4.22–5.81)

## 2013-08-13 MED ORDER — LACTULOSE 10 GM/15ML PO SOLN
20.0000 g | Freq: Every day | ORAL | Status: DC | PRN
  Filled 2013-08-13: qty 30

## 2013-08-13 MED FILL — Potassium Chloride Inj 2 mEq/ML: INTRAVENOUS | Qty: 40 | Status: AC

## 2013-08-13 MED FILL — Magnesium Sulfate Inj 50%: INTRAMUSCULAR | Qty: 10 | Status: AC

## 2013-08-13 MED FILL — Heparin Sodium (Porcine) Inj 1000 Unit/ML: INTRAMUSCULAR | Qty: 30 | Status: AC

## 2013-08-13 MED FILL — Sodium Chloride IV Soln 0.9%: INTRAVENOUS | Qty: 1000 | Status: AC

## 2013-08-13 NOTE — Progress Notes (Signed)
Clinical Social Work Department BRIEF PSYCHOSOCIAL ASSESSMENT 08/13/2013  Patient:  Erik Alvarado, Erik Alvarado     Account Number:  [id number]     Admit date:  08/08/2013  Clinical Social Worker:  Carren Rang  Date/Time:  08/13/2013 11:09 AM  Referred by:  Physician  Date Referred:  08/13/2013 Referred for  SNF Placement   Other Referral:   Interview type:  Patient Other interview type:    PSYCHOSOCIAL DATA Living Status:  ALONE Admitted from facility:   Level of care:   Primary support name:  Erik Alvarado Primary support relationship to patient:  FRIEND Degree of support available:   Good    CURRENT CONCERNS Current Concerns  Post-Acute Placement   Other Concerns:    SOCIAL WORK ASSESSMENT / PLAN CSW received referral that patient needs SNF placement. CSW went into room and explained SNF process to patient. Patient reports he does not want to go to SNF and is waiting on phone calls from friends to see if someone can stay with him at his house. Patient asked social worker to come back later after he hears back from his friends. CSW agreed and will speak with patient later today.   Assessment/plan status:  Psychosocial Support/Ongoing Assessment of Needs Other assessment/ plan:   Information/referral to community resources:   SNF information    PATIENT'S/FAMILY'S RESPONSE TO PLAN OF CARE: Patient reports he does not want to go to SNF.        Erik Alvarado, MSW, Theresia Majors [phone]

## 2013-08-13 NOTE — Care Management Note (Addendum)
    Page 1 of 1   08/14/2013     4:28:24 PM   CARE MANAGEMENT NOTE 08/14/2013  Patient:  Erik Alvarado, Erik Alvarado   Account Number:  [id number]  Date Initiated:  08/08/2013  Documentation initiated by:  Junius Creamer  Subjective/Objective Assessment:   adm w ch pain, pos cath, for cvts eval     Action/Plan:   lives w sign other   Anticipated DC Date:  08/15/2013   Anticipated DC Plan:  HOME W HOME HEALTH SERVICES      DC Planning Services  CM consult      Choice offered to / List presented to:             Status of service:  In process, will continue to follow Medicare Important Message given?   (If response is "NO", the following Medicare IM given date fields will be blank) Date Medicare IM given:   Date Additional Medicare IM given:    Discharge Disposition:    Per UR Regulation:  Reviewed for med. necessity/level of care/duration of stay  If discussed at Long Length of Stay Meetings, dates discussed:    Comments:  Contact:  Kkendrick,Catherine Significant other [phone]  08/14/13 Billiejean Schimek,RN,BSN [phone] PT STATES HE HAS WORKED OUT DC PLAN TO STAY WITH A FRIEND, CATHERINE AT DC.   SHE WIL PROVIDE 24HR CARE.  08/13/13 Lola Czerwonka,RN,BSN [phone] MET WITH PT TO DISCUSS DC PLANS; PT STATES HE IS TRYING TO WORK OUT FOR HIM TO STAY WITH HIS FRIEND CATHERINE AT DC. SHE IS VISITING LATER TODAY, AND WILL HAVE A DC PLAN FINALIZED TODAY, PER PT.  08-11-13 2:55pm Avie Arenas, RNBSN [phone] Post op CABG x4 on 08-10-13.

## 2013-08-13 NOTE — Progress Notes (Signed)
CARDIAC REHAB PHASE I   PRE:  Rate/Rhythm: 72SR  BP:  Supine:   Sitting: 110/64  Standing:    SaO2: 97%2L,  95%RA  MODE:  Ambulation: 390 ft   POST:  Rate/Rhythm: 73 SR  BP:  Supine:   Sitting: 120/62  Standing:    SaO2: 93%RA hall, 97%RA room [phone] Started out as asst x 2 but 1 asst broke away after walking assessed. Pt walked 390 ft on RA with rolling walker and assistance. Checked sats during walk and stable. Left off oxygen. To sitting on side of bed after walk. Discussed with pt that he needs two more walks today.  Pt stated he is working on getting someone to stay with him.   Luetta Nutting, RN BSN  08/13/2013 9:44 AM

## 2013-08-13 NOTE — Progress Notes (Signed)
     SUBJECTIVE: Chest remains sore. Chronic back pain. No other complaints.   BP 107/57  Pulse 71  Temp(Src) 99.1 F (37.3 C) (Oral)  Resp 18  Ht 6' (1.829 m)  Wt 180 lb 6.4 oz (81.829 kg)  BMI 24.46 kg/m2  SpO2 100%  Intake/Output Summary (Last 24 hours) at 08/13/13 0744 Last data filed at 08/12/13 2154  Gross per 24 hour  Intake    600 ml  Output    650 ml  Net    -50 ml    PHYSICAL EXAM General: Well developed, well nourished, in no acute distress. Alert and oriented x 3.  Psych:  Good affect, responds appropriately Neck: No JVD. No masses noted.  Lungs: Clear bilaterally with no wheezes or rhonci noted.  Heart: RRR with no murmurs noted. Abdomen: Bowel sounds are present. Soft, non-tender.  Extremities: No lower extremity edema.   LABS: Basic Metabolic Panel:  Recent Labs  45/40/98 0435 08/11/13 1500 08/12/13 0325 08/13/13 0530  NA 140  --  136 138  K 4.3  --  3.8 4.1  CL 108  --  102 103  CO2 23  --  25 28  GLUCOSE 127*  --  113* 130*  BUN 9  --  13 14  CREATININE 0.78 0.75 0.72 0.83  CALCIUM 8.0*  --  8.1* 8.2*  MG 1.9 1.9  --   --    CBC:  Recent Labs  08/12/13 0325 08/13/13 0530  WBC 12.4* 10.9*  HGB 9.8* 9.0*  HCT 29.2* 26.5*  MCV 100.3* 100.0  PLT 108* 109*   Current Meds: . acetaminophen  1,000 mg Oral Q6H   Or  . acetaminophen (TYLENOL) oral liquid 160 mg/5 mL  1,000 mg Per Tube Q6H  . aspirin EC  325 mg Oral Daily   Or  . aspirin  324 mg Per Tube Daily  . atorvastatin  80 mg Oral q1800  . bisacodyl  10 mg Oral Daily   Or  . bisacodyl  10 mg Rectal Daily  . budesonide-formoterol  2 puff Inhalation BID  . docusate sodium  200 mg Oral Daily  . furosemide  40 mg Oral Daily  . levalbuterol  0.63 mg Nebulization QID  . metoCLOPramide (REGLAN) injection  10 mg Intravenous Q6H  . metoprolol tartrate  12.5 mg Oral BID   Or  . metoprolol tartrate  12.5 mg Per Tube BID  . nicotine  14 mg Transdermal Daily  . pantoprazole  40 mg  Oral BID AC  . potassium chloride  20 mEq Oral BID  . sodium chloride  3 mL Intravenous Q12H     ASSESSMENT AND PLAN:   1. CAD/Unstable angina: Pt admitted 08/08/13 with unstable angina. Cardiac cath per Dr. Excell Seltzer with critical left main stenosis, severe RCA stenosis. S/p 4V CABG 08/10/13. Stable on post-op day 3. Sinus rhythm. Post-op anemia. Continue ASA, statin, beta blocker. He can follow up with his cardiologist in [place] after d/c. If he chooses to come to [facility] for f/u, he can follow with Dr. Jens Som.     Erik Alvarado  12/3/20147:44 AM

## 2013-08-13 NOTE — Progress Notes (Signed)
      [address]       [name] [address]             [phone]      3 Days Post-Op Procedure(s) (LRB): CORONARY ARTERY BYPASS GRAFTING (CABG) X 4 using left internal mammary artery and bilateral saphenous vein (N/A) TRANSESOPHAGEAL ECHOCARDIOGRAM (TEE) (N/A)  Subjective:  Mr. Cassada complains of chest soreness.  He also has some back pain, which is chronic for him.  He did not ambulate much yesterday due to his back pain.  I explained to the patient the importance of ambulating throughout the day.  No BM  Objective: Vital signs in last 24 hours: Temp:  [98 F (36.7 C)-99.1 F (37.3 C)] 99.1 F (37.3 C) (12/03 0430) Pulse Rate:  [70-88] 71 (12/03 0430) Cardiac Rhythm:  [-] Normal sinus rhythm (12/02 2154) Resp:  [14-18] 18 (12/03 0430) BP: (102-116)/(55-73) 107/57 mmHg (12/03 0430) SpO2:  [93 %-100 %] 100 % (12/03 0430) FiO2 (%):  [0 %] 0 % (12/02 1152) Weight:  [180 lb 6.4 oz (81.829 kg)] 180 lb 6.4 oz (81.829 kg) (12/03 0430)  Intake/Output from previous day: 12/02 0701 - 12/03 0700 In: 600 [P.O.:480; I.V.:20; IV Piggyback:100] Out: 650 [Urine:650]  General appearance: alert, cooperative and no distress Heart: regular rate and rhythm Lungs: coarse, clears with cough Abdomen: soft, non-tender; bowel sounds normal; no masses,  no organomegaly Extremities: edema 1+ pitting Wound: clean and dry  Lab Results:  Recent Labs  08/12/13 0325 08/13/13 0530  WBC 12.4* 10.9*  HGB 9.8* 9.0*  HCT 29.2* 26.5*  PLT 108* 109*   BMET:  Recent Labs  08/12/13 0325 08/13/13 0530  NA 136 138  K 3.8 4.1  CL 102 103  CO2 25 28  GLUCOSE 113* 130*  BUN 13 14  CREATININE 0.72 0.83  CALCIUM 8.1* 8.2*    PT/INR:  Recent Labs  08/10/13 1430  LABPROT 16.3*  INR 1.34   ABG    Component Value Date/Time   PHART 7.333* 08/11/2013 0438   HCO3 22.1 08/11/2013 0438   TCO2 23 08/11/2013 0438   ACIDBASEDEF 4.0* 08/11/2013 0438   O2SAT 98.0 08/11/2013 0438    CBG (last 3)   Recent Labs  08/11/13 2335 08/12/13 0324 08/12/13 0740  GLUCAP 104* 97 98    Assessment/Plan: S/P Procedure(s) (LRB): CORONARY ARTERY BYPASS GRAFTING (CABG) X 4 using left internal mammary artery and bilateral saphenous vein (N/A) TRANSESOPHAGEAL ECHOCARDIOGRAM (TEE) (N/A)  1. CV- NSR, continue Lopressor 2. Pulm- wean oxygen as tolerated, + productive cough, + atelectasis, encouraged use of IS 3. ID- mildly febrile this morning, likely due to atelectasis will watch 4. Renal- creatinine WNL, remains volume overloaded, on Lasix 5. Thrombocytopenia- stable at 109, off Lovenox, HIT pending 6. Deconditioning- patient with chronic back pain, not ambulating much due to this lives alone, PT consult placed, will likely need SNF placement 7. Dispo- patient progressing, wean oxygen as tolerated, start discharge planning, d/c EPW   LOS: 5 days    Raford Pitcher, Denny Peon 08/13/2013

## 2013-08-13 NOTE — Progress Notes (Signed)
Removed epicardial wires per order. 3 intact.  Pt tolerated procedure well.  Pt instructed to remain on bedrest for one hour.  Frequent vitals will be taken and documented. Pt resting with call bell within reach. Erik Alvarado   

## 2013-08-13 NOTE — Evaluation (Signed)
Physical Therapy Evaluation Patient Details Name: Erik Alvarado MRN: [id number] DOB: Jan 10, 1951 Today's Date: 08/13/2013 Time: 6213-0865 PT Time Calculation (min): 21 min  PT Assessment / Plan / Recommendation History of Present Illness  Pt admitted 08/08/13 with unstable angina. Cardiac cath per Dr. Excell Seltzer with critical left main stenosis, severe RCA stenosis. S/p 4V CABG 08/10/13. Stable on post-op day 3.   Clinical Impression  Pt admitted with the above. Pt currently with functional limitations due to the deficits listed below (see PT Problem List). Pt moving well and very motivated to get home.  Pt will benefit from skilled PT to increase their independence and safety with mobility to allow discharge to the venue listed below.       PT Assessment  Patient needs continued PT services    Follow Up Recommendations  No PT follow up;Supervision - Intermittent    Does the patient have the potential to tolerate intense rehabilitation      Barriers to Discharge        Equipment Recommendations  None recommended by PT    Recommendations for Other Services     Frequency Min 3X/week    Precautions / Restrictions Precautions Precautions: Sternal Restrictions Weight Bearing Restrictions: No   Pertinent Vitals/Pain 6/10 sternal/back pain; premedicated      Mobility  Bed Mobility Bed Mobility: Supine to Sit;Sit to Supine Supine to Sit: 4: Min guard;HOB flat Sit to Supine: 4: Min guard;HOB flat Details for Bed Mobility Assistance: Minguard for safety and to maintain sternal precautions.  Transfers Transfers: Sit to Stand;Stand to Sit Sit to Stand: 4: Min guard;From bed Stand to Sit: 4: Min guard;To bed Details for Transfer Assistance: Minguard for safety with cues to maintain sternal precautions.  Pt attempts to use hands on RW during transfers.  Ambulation/Gait Ambulation/Gait Assistance: 4: Min guard Ambulation Distance (Feet): 200 Feet Assistive device: Rolling  walker Ambulation/Gait Assistance Details: Minguard for safety with cues for RW placement.  Gait Pattern: Step-through pattern;Decreased stride length Gait velocity: decreased  Stairs: Yes Stairs Assistance: 4: Min guard Stair Management Technique: One rail Right Number of Stairs: 3    Exercises     PT Diagnosis: Generalized weakness;Acute pain  PT Problem List: Decreased strength;Decreased activity tolerance;Decreased balance;Decreased mobility;Decreased knowledge of use of DME;Decreased knowledge of precautions;Pain;Cardiopulmonary status limiting activity PT Treatment Interventions: DME instruction;Gait training;Stair training;Functional mobility training;Therapeutic activities;Therapeutic exercise;Balance training;Patient/family education     PT Goals(Current goals can be found in the care plan section) Acute Rehab PT Goals Patient Stated Goal: To go home as soon as possible  PT Goal Formulation: With patient/family Time For Goal Achievement: 08/20/13 Potential to Achieve Goals: Good  Visit Information  Last PT Received On: 08/13/13 Assistance Needed: +1 History of Present Illness: Pt admitted 08/08/13 with unstable angina. Cardiac cath per Dr. Excell Seltzer with critical left main stenosis, severe RCA stenosis. S/p 4V CABG 08/10/13. Stable on post-op day 3.        Prior Functioning  Home Living Family/patient expects to be discharged to:: Private residence Living Arrangements: Other (Comment) (significant other) Available Help at Discharge: Family;Available 24 hours/day Type of Home: Mobile home Home Access: Stairs to enter Entrance Stairs-Number of Steps: 4 Entrance Stairs-Rails: None Home Layout: One level Home Equipment: Walker - 2 wheels;Hand held shower head Prior Function Level of Independence: Independent Communication Communication: No difficulties Dominant Hand: Right    Cognition  Cognition Arousal/Alertness: Awake/alert Behavior During Therapy: WFL for tasks  assessed/performed Overall Cognitive Status: Within Functional Limits for tasks  assessed    Extremity/Trunk Assessment Lower Extremity Assessment Lower Extremity Assessment: Generalized weakness   Balance    End of Session PT - End of Session Equipment Utilized During Treatment: Gait belt Activity Tolerance: Patient tolerated treatment well Patient left: in bed;with call bell/phone within reach Nurse Communication: Mobility status  GP     Dagmar Adcox 08/13/2013, 3:44 PM  Jake Shark, PT DPT [phone]

## 2013-08-14 DIAGNOSIS — R079 Chest pain, unspecified: Secondary | ICD-10-CM

## 2013-08-14 NOTE — Progress Notes (Signed)
CARDIAC REHAB PHASE I   PRE:  Rate/Rhythm: 81 SR    BP: sitting 100/60    SaO2: 97 RA  MODE:  Ambulation: 430 ft   POST:  Rate/Rhythm: 80     BP: sitting 130/70     SaO2: 92 RA  Tolerated well with RW. Steady. C/o nausea due to "many buckets of pills" he took. He had many c/os about this. Return to bed. Will f/u. [phone]   Elissa Lovett [place] CES, ACSM 08/14/2013 3:10 PM

## 2013-08-14 NOTE — Progress Notes (Addendum)
1100 Cardiac Rehab Pt states that he is just back from a walk. He walked 550 feet using walker. I gave pt Moving right  Along after [facility] booklet and encouraged him to watch recovery from heart surgery video. We will follow pt later. Pt states that he has someone who will get him a walker to use at home. Beatrix Fetters, RN 08/14/2013 11:15 AM

## 2013-08-14 NOTE — Progress Notes (Signed)
Per MD note, patient has arranged for someone to stay with him for intermittent supervision at home. CSW signing off at this time. Please re consult if social work needs arise.  Maree Krabbe, MSW, Theresia Majors [phone]

## 2013-08-14 NOTE — Progress Notes (Addendum)
      [address]       [name] [address]             [phone]      4 Days Post-Op Procedure(s) (LRB): CORONARY ARTERY BYPASS GRAFTING (CABG) X 4 using left internal mammary artery and bilateral saphenous vein (N/A) TRANSESOPHAGEAL ECHOCARDIOGRAM (TEE) (N/A)  Subjective:  Mr. Tibbitts is doing better this morning.  He states his pain level has improved since he has not been coughing as much.  He ambulated yesterday with help of rolling walker.  He has also been evaluated by PT who recommends intermittent supervision.  The patient has arranged for help from friends and will have care. + BM  Objective: Vital signs in last 24 hours: Temp:  [99.1 F (37.3 C)-99.5 F (37.5 C)] 99.2 F (37.3 C) (12/04 0402) Pulse Rate:  [70-86] 74 (12/04 0402) Cardiac Rhythm:  [-] Normal sinus rhythm (12/03 2111) Resp:  [18] 18 (12/04 0402) BP: (97-114)/(62-70) 97/62 mmHg (12/04 0402) SpO2:  [93 %-98 %] 94 % (12/04 0402) Weight:  [175 lb 7.8 oz (79.6 kg)] 175 lb 7.8 oz (79.6 kg) (12/04 0402)  Intake/Output from previous day: 12/03 0701 - 12/04 0700 In: 480 [P.O.:480] Out: 2275 [Urine:2275]  General appearance: alert, cooperative and no distress Heart: regular rate and rhythm Lungs: clear to auscultation bilaterally Abdomen: soft, non-tender; bowel sounds normal; no masses,  no organomegaly Extremities: edema improved Wound: clean and dry  Lab Results:  Recent Labs  08/12/13 0325 08/13/13 0530  WBC 12.4* 10.9*  HGB 9.8* 9.0*  HCT 29.2* 26.5*  PLT 108* 109*   BMET:  Recent Labs  08/12/13 0325 08/13/13 0530  NA 136 138  K 3.8 4.1  CL 102 103  CO2 25 28  GLUCOSE 113* 130*  BUN 13 14  CREATININE 0.72 0.83  CALCIUM 8.1* 8.2*    PT/INR: No results found for this basename: LABPROT, INR,  in the last 72 hours ABG    Component Value Date/Time   PHART 7.333* 08/11/2013 0438   HCO3 22.1 08/11/2013 0438   TCO2 23 08/11/2013 0438   ACIDBASEDEF 4.0* 08/11/2013 0438    O2SAT 98.0 08/11/2013 0438   CBG (last 3)   Recent Labs  08/11/13 2335 08/12/13 0324 08/12/13 0740  GLUCAP 104* 97 98    Assessment/Plan: S/P Procedure(s) (LRB): CORONARY ARTERY BYPASS GRAFTING (CABG) X 4 using left internal mammary artery and bilateral saphenous vein (N/A) TRANSESOPHAGEAL ECHOCARDIOGRAM (TEE) (N/A)  1. CV- NSR continue Lopressor 2. Pulm- wean off oxygen yesterday, encouraged use of IS 3. Renal- volume status improving, down 5 lbs from yesterday, will continue diuresis remains 10 lbs above admission 4. ID- continues to have occasional low grade fever, no acute evidence of infection, no leukocytosis- most likely due to atelectasis 5. Deconditioning- per PT safe for home dishcarge 6. Dispo- patient doing well, off oxygen, continue ambulation, will repeat labs in AM if no issues arise will plan to d/c tomorrow   LOS: 6 days    BARRETT, ERIN 08/14/2013  patient examined and medical record reviewed,agree with above note.  Smoking cessation discussed with patient Please arrange for referral to smoking cessation clinic as outpatient VAN TRIGT III,Malaky Tetrault 08/14/2013

## 2013-08-14 NOTE — Progress Notes (Signed)
Pt ambulated 200 feet on R/A with front wheel walker. Pt tolerated walk well. Pt now resting in bed. Will continue to monitor.   Genevive Bi, RN
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
## 2013-08-15 ENCOUNTER — Inpatient Hospital Stay (HOSPITAL_COMMUNITY): Payer: Medicaid Other

## 2013-08-15 LAB — BASIC METABOLIC PANEL
BUN: 14 mg/dL (ref 6–23)
CO2: 27 mEq/L (ref 19–32)
Calcium: 8.2 mg/dL — ABNORMAL LOW (ref 8.4–10.5)
Creatinine, Ser: 0.78 mg/dL (ref 0.50–1.35)
GFR calc non Af Amer: >90 mL/min (ref >90)
Glucose, Bld: 149 mg/dL — ABNORMAL HIGH (ref 70–99)
Sodium: 140 mEq/L (ref 135–145)

## 2013-08-15 LAB — HEPARIN INDUCED THROMBOCYTOPENIA PNL
Patient O.D.: 0.13
UFH High Dose UFH H: 0 % Release
UFH Low Dose 0.1 IU/mL: 0 % Release
UFH Low Dose 0.5 IU/mL: 0 % Release
UFH SRA Result: NEGATIVE

## 2013-08-15 LAB — CBC
HCT: 24.9 % — ABNORMAL LOW (ref 39.0–52.0)
Hemoglobin: 8.6 g/dL — ABNORMAL LOW (ref 13.0–17.0)
MCH: 33.5 pg (ref 26.0–34.0)
MCHC: 34.5 g/dL (ref 30.0–36.0)
MCV: 96.9 fL (ref 78.0–100.0)
RBC: 2.57 MIL/uL — ABNORMAL LOW (ref 4.22–5.81)

## 2013-08-15 MED ORDER — FERROUS SULFATE 325 (65 FE) MG PO TABS: 325.0000 mg | ORAL_TABLET | Freq: Three times a day (TID) | ORAL | Status: DC

## 2013-08-15 MED ORDER — POTASSIUM CHLORIDE CRYS ER 20 MEQ PO TBCR: 20.0000 meq | EXTENDED_RELEASE_TABLET | Freq: Two times a day (BID) | ORAL | Status: DC

## 2013-08-15 MED ORDER — OXYCODONE HCL 5 MG PO TABS: 5.0000 mg | ORAL_TABLET | ORAL | Status: DC | PRN

## 2013-08-15 MED ORDER — NICOTINE 14 MG/24HR TD PT24: 14.0000 mg | MEDICATED_PATCH | Freq: Every day | TRANSDERMAL | Status: DC

## 2013-08-15 MED ORDER — TRAMADOL HCL 50 MG PO TABS: 50.0000 mg | ORAL_TABLET | Freq: Four times a day (QID) | ORAL | Status: DC | PRN

## 2013-08-15 MED ORDER — METOPROLOL TARTRATE 25 MG PO TABS: 12.5000 mg | ORAL_TABLET | Freq: Two times a day (BID) | ORAL | Status: DC

## 2013-08-15 MED ORDER — ATORVASTATIN CALCIUM 80 MG PO TABS: 80.0000 mg | ORAL_TABLET | Freq: Every day | ORAL | Status: DC

## 2013-08-15 MED ORDER — GUAIFENESIN-DM 100-10 MG/5ML PO SYRP: 15.0000 mL | ORAL_SOLUTION | ORAL | Status: DC | PRN

## 2013-08-15 MED ORDER — POTASSIUM CHLORIDE CRYS ER 20 MEQ PO TBCR
40.0000 meq | EXTENDED_RELEASE_TABLET | Freq: Once | ORAL | Status: AC
  Administered 2013-08-15: 40 meq via ORAL

## 2013-08-15 MED ORDER — FUROSEMIDE 40 MG PO TABS: 40.0000 mg | ORAL_TABLET | Freq: Every day | ORAL | Status: DC

## 2013-08-15 MED ORDER — FERROUS SULFATE 325 (65 FE) MG PO TABS
325.0000 mg | ORAL_TABLET | Freq: Three times a day (TID) | ORAL | Status: DC
  Filled 2013-08-15 (×3): qty 1

## 2013-08-15 MED ORDER — BUDESONIDE-FORMOTEROL FUMARATE 160-4.5 MCG/ACT IN AERO: 2.0000 | INHALATION_SPRAY | Freq: Two times a day (BID) | RESPIRATORY_TRACT | Status: DC

## 2013-08-15 NOTE — Progress Notes (Addendum)
      [address]       [place] [address]             [phone]      5 Days Post-Op Procedure(s) (LRB): CORONARY ARTERY BYPASS GRAFTING (CABG) X 4 using left internal mammary artery and bilateral saphenous vein (N/A) TRANSESOPHAGEAL ECHOCARDIOGRAM (TEE) (N/A)  Subjective:  [name] has no complaints this morning.  He states he feels good and is ready to go home.  I again questioned the patient regarding help at discharge and stressed the importance of this.  He was again offered SNF placement and he continues to decline. Patient is ambulating independently with walker + BM  Objective: Vital signs in last 24 hours: Temp:  [98.3 F (36.8 C)-99.4 F (37.4 C)] 98.4 F (36.9 C) (12/05 0518) Pulse Rate:  [70-80] 73 (12/05 0518) Cardiac Rhythm:  [-] Junctional rhythm (12/04 2038) Resp:  [18-20] 20 (12/05 0518) BP: (106-120)/(61-68) 106/61 mmHg (12/05 0518) SpO2:  [95 %-97 %] 97 % (12/05 0518) Weight:  [172 lb 6.4 oz (78.2 kg)] 172 lb 6.4 oz (78.2 kg) (12/05 0501)  Intake/Output from previous day: 12/04 0701 - 12/05 0700 In: 963 [P.O.:960; I.V.:3] Out: 450 [Urine:450]  General appearance: alert, cooperative and no distress Heart: regular rate and rhythm Lungs: clear to auscultation bilaterally Abdomen: soft, non-tender; bowel sounds normal; no masses,  no organomegaly Extremities: edema trace Wound: clean and dry  Lab Results:  Recent Labs  08/13/13 0530 08/15/13 0416  WBC 10.9* 6.6  HGB 9.0* 8.6*  HCT 26.5* 24.9*  PLT 109* 171   BMET:  Recent Labs  08/13/13 0530 08/15/13 0416  NA 138 140  K 4.1 3.4*  CL 103 104  CO2 28 27  GLUCOSE 130* 149*  BUN 14 14  CREATININE 0.83 0.78  CALCIUM 8.2* 8.2*    PT/INR: No results found for this basename: LABPROT, INR,  in the last 72 hours ABG    Component Value Date/Time   PHART 7.333* 08/11/2013 0438   HCO3 22.1 08/11/2013 0438   TCO2 23 08/11/2013 0438   ACIDBASEDEF 4.0* 08/11/2013 0438   O2SAT  98.0 08/11/2013 0438   CBG (last 3)  No results found for this basename: GLUCAP,  in the last 72 hours  Assessment/Plan: S/P Procedure(s) (LRB): CORONARY ARTERY BYPASS GRAFTING (CABG) X 4 using left internal mammary artery and bilateral saphenous vein (N/A) TRANSESOPHAGEAL ECHOCARDIOGRAM (TEE) (N/A)  1. CV- NSR on Lopressor, will not start ACE due to hypotension 2. Pulm- off oxygen, continued use of IS, CXR looks good, small pleural effusion on left 3. Renal-creatinine WNL,  Hypokalemic will supplement, volume status continues to improve will continue diuresis 4. Expected Acute Blood Loss Anemia- will start Iron supplementation 5. GI- fever resolved, no leukocytosis 6. Thrombocytopenia- improved, up to 170, HIT pending 7. Dispo- patient doing well, will supplement potassium, start Iron- d/c home today   LOS: 7 days    BARRETT, ERIN 08/15/2013

## 2013-08-15 NOTE — Progress Notes (Signed)
[id number] Education completed with pt and significant other.  Understanding voiced. Would like rolling walker with big wheels for home use. Encouraged IS. Discussed smoking cessation and gave handouts. Pt states thinks he can stop cold [place] as he has done it in the past and stayed quit for many years. Discussed CRP 2 but they would like to talk to MD more about program and do not want me to refer. Did not want to watch post op video. Pt states he has read [facility] booklet. Luetta Nutting RN BSN 08/15/2013 10:28 AM

## 2013-08-15 NOTE — Discharge Instructions (Signed)
Coronary Artery Bypass Grafting, Care After °Refer to this sheet in the next few weeks. These instructions provide you with information on caring for yourself after your procedure. Your health care provider may also give you more specific instructions. Your treatment has been planned according to current medical practices, but problems sometimes occur. Call your health care provider if you have any problems or questions after your procedure. °WHAT TO EXPECT AFTER THE PROCEDURE °Recovery from surgery will be different for everyone. Some people feel well after 3 or 4 weeks, while for others it takes longer. After your procedure, it is typical to have the following: °· Nausea and a lack of appetite.   °· Constipation. °· Weakness and fatigue.   °· Depression or irritability.   °· Pain or discomfort at your incision site. °HOME CARE INSTRUCTIONS °· Only take over-the-counter or prescription medicines as directed by your health care provider. Take all medicines exactly as directed. Do not stop taking medicines or start any new medicines without first checking with your health care provider.   °· Take your pulse as directed by your health care provider. °· Perform deep breathing as directed by your health care provider. If you were given a device called an incentive spirometer, use it to practice deep breathing several times a day. Support your chest with a pillow or your arms when you take deep breaths or cough. °· Keep incision areas clean, dry, and protected. Remove or change any bandages (dressings) only as directed by your health care provider. You may have skin adhesive strips over the incision areas. Do not take the strips off. They will fall off on their own. °· Check incision areas daily for any swelling, redness, or drainage. °· If incisions were made in your legs, do the following: °· Avoid crossing your legs.   °· Avoid sitting for long periods of time. Change positions every 30 minutes.   °· Elevate your legs  when you are sitting.   °· Wear compression stockings as directed by your health care provider. These stockings help keep blood clots from forming in your legs. °· Take showers once your health care provider approves. Until then, only take sponge baths. Pat incisions dry. Do not rub incisions with a washcloth or towel. Do not take tub baths or go swimming until your health care provider approves. °· Eat foods that are high in fiber, such as raw fruits and vegetables, whole grains, beans, and nuts. Meats should be lean cut. Avoid canned, processed, and fried foods. °· Drink enough fluids to keep your urine clear or pale yellow. °· Weigh yourself every day. This helps identify if you are retaining fluid that may make your heart and lungs work harder.   °· Rest and limit activity as directed by your health care provider. You may be instructed to: °· Stop any activity at once if you have chest pain, shortness of breath, irregular heartbeats, or dizziness. Get help right away if you have any of these symptoms. °· Move around frequently for short periods or take short walks as directed by your health care provider. Increase your activities gradually. You may need physical therapy or cardiac rehabilitation to help strengthen your muscles and build your endurance. °· Avoid lifting, pushing, or pulling anything heavier than 10 lb (4.5 kg) for at least 6 weeks after surgery. °· Do not drive until your health care provider approves.  °· Ask your health care provider when you may return to work and resume sexual activity. °· Follow up with your health care provider as   directed.   °SEEK MEDICAL CARE IF: °· You have swelling, redness, increasing pain, or drainage at the site of an incision.   °· You develop a fever.   °· You have swelling in your ankles or legs.   °· You have pain in your legs.   °· You have weight gain of 2 or more pounds a day. °· You are nauseous or vomit. °· You have diarrhea.  °SEEK IMMEDIATE MEDICAL CARE  IF: °· You have chest pain that goes to your jaw or arms. °· You have shortness of breath.   °· You have a fast or irregular heartbeat.   °· You notice a "clicking" in your breastbone (sternum) when you move.   °· You have numbness or weakness in your arms or legs. °· You feel dizzy or lightheaded.   °MAKE SURE YOU: °· Understand these instructions. °· Will watch your condition. °· Will get help right away if you are not doing well or get worse. °Document Released: 03/17/2005 Document Revised: 04/30/2013 Document Reviewed: 02/04/2013 °ExitCare® Patient Information ©2014 [employer]. ° °Endoscopic Saphenous Vein Harvesting °Care After °Refer to this sheet in the next few weeks. These instructions provide you with information on caring for yourself after your procedure. Your caregiver may also give you more specific instructions. Your treatment has been planned according to current medical practices, but problems sometimes occur. Call your caregiver if you have any problems or questions after your procedure. °HOME CARE INSTRUCTIONS °Medicine °· Take whatever pain medicine your surgeon prescribes. Follow the directions carefully. Do not take over-the-counter pain medicine unless your surgeon says it is okay. Some pain medicine can cause bleeding problems for several weeks after surgery. °· Follow your surgeon's instructions about driving. You will probably not be permitted to drive after heart surgery. °· Take any medicines your surgeon prescribes. Any medicines you took before your heart surgery should be checked with your caregiver before you start taking them again. °Wound care °· Ask your surgeon how long you should keep wearing your elastic bandage or stocking. °· Check the area around your surgical cuts (incisions) whenever your bandages (dressings) are changed. Look for any redness or swelling. °· You will need to return to have the stitches (sutures) or staples taken out. Ask your surgeon when to do  that. °· Ask your surgeon when you can shower or bathe. °Activity °· Try to keep your legs raised when you are sitting. °· Do any exercises your caregivers have given you. These may include deep breathing exercises, coughing, walking, or other exercises. °SEEK MEDICAL CARE IF: °· You have any questions about your medicines. °· You have more leg pain, especially if your pain medicine stops working. °· New or growing bruises develop on your leg. °· Your leg swells, feels tight, or becomes red. °· You have numbness in your leg. °SEEK IMMEDIATE MEDICAL CARE IF: °· Your pain gets much worse. °· Blood or fluid leaks from any of the incisions. °· Your incisions become warm, swollen, or red. °· You have chest pain. °· You have trouble breathing. °· You have a fever. °· You have more pain near your leg incision. °MAKE SURE YOU: °· Understand these instructions. °· Will watch your condition. °· Will get help right away if you are not doing well or get worse. °Document Released: 05/10/2011 Document Revised: 11/20/2011 Document Reviewed: 05/10/2011 °ExitCare® Patient Information ©2014 [employer]. ° ° °

## 2013-08-24 NOTE — Discharge Summary (Signed)
patient examined and medical record reviewed,agree with above note. VAN TRIGT III,PETER 08/24/2013   

## 2013-08-26 ENCOUNTER — Telehealth: Payer: Self-pay | Admitting: Cardiovascular Disease

## 2013-08-26 ENCOUNTER — Other Ambulatory Visit: Payer: Self-pay

## 2013-08-26 DIAGNOSIS — G8918 Other acute postprocedural pain: Secondary | ICD-10-CM

## 2013-08-26 MED ORDER — OXYCODONE HCL 5 MG PO TABS: 5.0000 mg | ORAL_TABLET | ORAL | Status: DC | PRN

## 2013-08-26 MED ORDER — TRAMADOL HCL 50 MG PO TABS: 50.0000 mg | ORAL_TABLET | Freq: Two times a day (BID) | ORAL | Status: DC | PRN

## 2013-08-26 NOTE — Telephone Encounter (Signed)
Rx's for Oxycodone 5 mg and Tramadol 50 mg Printed out, Dr Dorris Fetch signed and pt will pick up at front desk.

## 2013-08-26 NOTE — Telephone Encounter (Signed)
New Message  Pt called states that while he was in the Hospital he doesn't recall seeing Dr. Jens Som... The EPH was scheduled with Dr. Jens Som and not Dr. Excell Seltzer and the pt requests a call back with clarification of who his Dr. Stann Mainland be moving forward// Please advise.

## 2013-08-26 NOTE — Telephone Encounter (Signed)
lmtcb

## 2013-08-26 NOTE — Telephone Encounter (Signed)
Wife calling stating that her husband cancelled the app with Dr. Jens Som on 12/26 because he didn't realize that Dr. Jens Som and Dr. Excell Seltzer were in same office.  Dr. Excell Seltzer had done his heart cath.  Will reschedule app for 12/26

## 2013-09-03 ENCOUNTER — Encounter: Payer: Self-pay | Admitting: *Deleted

## 2013-09-05 ENCOUNTER — Ambulatory Visit (INDEPENDENT_AMBULATORY_CARE_PROVIDER_SITE_OTHER): Payer: Medicaid Other | Admitting: Cardiology

## 2013-09-05 ENCOUNTER — Encounter: Payer: Self-pay | Admitting: Cardiology

## 2013-09-05 ENCOUNTER — Encounter: Payer: Medicaid Other | Admitting: Cardiology

## 2013-09-05 VITALS — BP 118/82 | HR 67 | Ht 72.0 in | Wt 165.0 lb

## 2013-09-05 DIAGNOSIS — Z720 Tobacco use: Secondary | ICD-10-CM

## 2013-09-05 DIAGNOSIS — E785 Hyperlipidemia, unspecified: Secondary | ICD-10-CM | POA: Insufficient documentation

## 2013-09-05 DIAGNOSIS — F17200 Nicotine dependence, unspecified, uncomplicated: Secondary | ICD-10-CM

## 2013-09-05 DIAGNOSIS — I10 Essential (primary) hypertension: Secondary | ICD-10-CM | POA: Insufficient documentation

## 2013-09-05 DIAGNOSIS — I2510 Atherosclerotic heart disease of native coronary artery without angina pectoris: Secondary | ICD-10-CM

## 2013-09-05 HISTORY — DX: Tobacco use: Z72.0

## 2013-09-05 HISTORY — DX: Essential (primary) hypertension: I10

## 2013-09-05 NOTE — Assessment & Plan Note (Signed)
Blood pressure controlled. Continue present medications. 

## 2013-09-05 NOTE — Assessment & Plan Note (Signed)
Continue aspirin and statin. 

## 2013-09-05 NOTE — Patient Instructions (Signed)
Your physician wants you to follow-up in: 6 MONTHS WITH DR Jens Som You will receive a reminder letter in the mail two months in advance. If you don't receive a letter, please call our office to schedule the follow-up appointment.   Your physician recommends that you return for lab work WHEN FASTING  REFERRAL TO PRIMARY CARE

## 2013-09-05 NOTE — Assessment & Plan Note (Signed)
Continue Lipitor. Check lipids and liver. 

## 2013-09-05 NOTE — Assessment & Plan Note (Signed)
Patient has discontinued. 
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
## 2013-09-09 ENCOUNTER — Other Ambulatory Visit: Payer: Self-pay | Admitting: *Deleted

## 2013-09-09 DIAGNOSIS — I2510 Atherosclerotic heart disease of native coronary artery without angina pectoris: Secondary | ICD-10-CM

## 2013-09-10 ENCOUNTER — Ambulatory Visit
Admission: RE | Admit: 2013-09-10 | Discharge: 2013-09-10 | Disposition: A | Discharge status: Home / Self Care | Payer: Medicaid Other | Source: Ambulatory Visit | Attending: Cardiothoracic Surgery | Admitting: Cardiothoracic Surgery

## 2013-09-10 ENCOUNTER — Ambulatory Visit (INDEPENDENT_AMBULATORY_CARE_PROVIDER_SITE_OTHER): Payer: Self-pay | Admitting: Cardiothoracic Surgery

## 2013-09-10 ENCOUNTER — Encounter: Payer: Self-pay | Admitting: Cardiothoracic Surgery

## 2013-09-10 ENCOUNTER — Other Ambulatory Visit (INDEPENDENT_AMBULATORY_CARE_PROVIDER_SITE_OTHER): Payer: Medicaid Other

## 2013-09-10 VITALS — BP 122/85 | HR 92 | Resp 20 | Ht 72.0 in | Wt 165.0 lb

## 2013-09-10 DIAGNOSIS — I2510 Atherosclerotic heart disease of native coronary artery without angina pectoris: Secondary | ICD-10-CM

## 2013-09-10 DIAGNOSIS — Z951 Presence of aortocoronary bypass graft: Secondary | ICD-10-CM

## 2013-09-10 DIAGNOSIS — Z720 Tobacco use: Secondary | ICD-10-CM

## 2013-09-10 DIAGNOSIS — E785 Hyperlipidemia, unspecified: Secondary | ICD-10-CM

## 2013-09-10 DIAGNOSIS — F17200 Nicotine dependence, unspecified, uncomplicated: Secondary | ICD-10-CM

## 2013-09-10 DIAGNOSIS — I10 Essential (primary) hypertension: Secondary | ICD-10-CM

## 2013-09-10 LAB — HEPATIC FUNCTION PANEL
ALT: 16 U/L (ref 0–53)
AST: 14 U/L (ref 0–37)
Albumin: 3.8 g/dL (ref 3.5–5.2)
Alkaline Phosphatase: 84 U/L (ref 39–117)
Bilirubin, Direct: 0.1 mg/dL (ref 0.0–0.3)
Total Bilirubin: 1 mg/dL (ref 0.3–1.2)
Total Protein: 7.4 g/dL (ref 6.0–8.3)

## 2013-09-10 LAB — LIPID PANEL
Cholesterol: 134 mg/dL (ref 0–200)
HDL: 31.4 mg/dL — ABNORMAL LOW (ref >39.00)
LDL Cholesterol: 84 mg/dL (ref 0–99)
Total CHOL/HDL Ratio: 4
Triglycerides: 91 mg/dL (ref 0.0–149.0)
VLDL: 18.2 mg/dL (ref 0.0–40.0)
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
## 2013-09-16 ENCOUNTER — Encounter: Payer: Self-pay | Admitting: *Deleted

## 2013-09-18 ENCOUNTER — Ambulatory Visit (HOSPITAL_COMMUNITY): Payer: Medicaid - Dental | Admitting: Dentistry

## 2013-09-18 ENCOUNTER — Encounter (HOSPITAL_COMMUNITY): Payer: Self-pay | Admitting: *Deleted

## 2013-09-18 ENCOUNTER — Encounter (HOSPITAL_COMMUNITY): Payer: Self-pay | Admitting: Respiratory Therapy

## 2013-09-18 ENCOUNTER — Encounter (HOSPITAL_COMMUNITY): Payer: Self-pay | Admitting: Dentistry

## 2013-09-18 ENCOUNTER — Encounter (INDEPENDENT_AMBULATORY_CARE_PROVIDER_SITE_OTHER): Payer: Self-pay

## 2013-09-18 VITALS — BP 146/83 | HR 62 | Temp 98.3°F

## 2013-09-18 DIAGNOSIS — K0530 Chronic periodontitis, unspecified: Secondary | ICD-10-CM

## 2013-09-18 DIAGNOSIS — K045 Chronic apical periodontitis: Secondary | ICD-10-CM

## 2013-09-18 DIAGNOSIS — K036 Deposits [accretions] on teeth: Secondary | ICD-10-CM

## 2013-09-18 DIAGNOSIS — K0401 Reversible pulpitis: Secondary | ICD-10-CM

## 2013-09-18 DIAGNOSIS — M264 Malocclusion, unspecified: Secondary | ICD-10-CM

## 2013-09-18 DIAGNOSIS — M270 Developmental disorders of jaws: Secondary | ICD-10-CM

## 2013-09-18 DIAGNOSIS — IMO0002 Reserved for concepts with insufficient information to code with codable children: Secondary | ICD-10-CM

## 2013-09-18 DIAGNOSIS — K08409 Partial loss of teeth, unspecified cause, unspecified class: Secondary | ICD-10-CM

## 2013-09-18 DIAGNOSIS — Z972 Presence of dental prosthetic device (complete) (partial): Secondary | ICD-10-CM

## 2013-09-18 DIAGNOSIS — I2510 Atherosclerotic heart disease of native coronary artery without angina pectoris: Secondary | ICD-10-CM

## 2013-09-18 DIAGNOSIS — M278 Other specified diseases of jaws: Secondary | ICD-10-CM

## 2013-09-18 DIAGNOSIS — K029 Dental caries, unspecified: Secondary | ICD-10-CM

## 2013-09-18 DIAGNOSIS — K0889 Other specified disorders of teeth and supporting structures: Secondary | ICD-10-CM
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
## 2013-09-18 NOTE — Patient Instructions (Signed)
The patient has been scheduled for an operating room procedure on September 19, 2013 at 7:30 AM at [facility]. Presurgical testing will be arranged thru their clinic. Patient was reminded to have nothing by mouth after midnight tonight. Patient to call if questions or concerns.  Dr. Enrique Sack

## 2013-09-19 ENCOUNTER — Encounter (HOSPITAL_COMMUNITY)
Admission: RE | Disposition: A | Discharge status: Home / Self Care | Payer: Self-pay | Source: Ambulatory Visit | Attending: Dentistry

## 2013-09-19 ENCOUNTER — Encounter (HOSPITAL_COMMUNITY): Payer: Self-pay | Admitting: *Deleted

## 2013-09-19 ENCOUNTER — Ambulatory Visit (HOSPITAL_COMMUNITY): Payer: Medicaid Other | Admitting: Anesthesiology

## 2013-09-19 ENCOUNTER — Encounter (HOSPITAL_COMMUNITY): Payer: Medicaid Other | Admitting: Anesthesiology

## 2013-09-19 ENCOUNTER — Ambulatory Visit (HOSPITAL_COMMUNITY)
Admission: RE | Admit: 2013-09-19 | Discharge: 2013-09-19 | Disposition: A | Discharge status: Home / Self Care | Payer: Medicaid Other | Source: Ambulatory Visit | Attending: Dentistry | Admitting: Dentistry

## 2013-09-19 DIAGNOSIS — Z951 Presence of aortocoronary bypass graft: Secondary | ICD-10-CM | POA: Insufficient documentation

## 2013-09-19 DIAGNOSIS — Z8551 Personal history of malignant neoplasm of bladder: Secondary | ICD-10-CM | POA: Insufficient documentation

## 2013-09-19 DIAGNOSIS — K089 Disorder of teeth and supporting structures, unspecified: Secondary | ICD-10-CM | POA: Insufficient documentation

## 2013-09-19 DIAGNOSIS — M278 Other specified diseases of jaws: Secondary | ICD-10-CM

## 2013-09-19 DIAGNOSIS — I10 Essential (primary) hypertension: Secondary | ICD-10-CM | POA: Insufficient documentation

## 2013-09-19 DIAGNOSIS — K0401 Reversible pulpitis: Secondary | ICD-10-CM | POA: Insufficient documentation

## 2013-09-19 DIAGNOSIS — E785 Hyperlipidemia, unspecified: Secondary | ICD-10-CM | POA: Insufficient documentation

## 2013-09-19 DIAGNOSIS — K0530 Chronic periodontitis, unspecified: Secondary | ICD-10-CM | POA: Diagnosis present

## 2013-09-19 DIAGNOSIS — K045 Chronic apical periodontitis: Secondary | ICD-10-CM | POA: Diagnosis present

## 2013-09-19 DIAGNOSIS — Z79899 Other long term (current) drug therapy: Secondary | ICD-10-CM | POA: Insufficient documentation

## 2013-09-19 DIAGNOSIS — Z87891 Personal history of nicotine dependence: Secondary | ICD-10-CM | POA: Insufficient documentation

## 2013-09-19 DIAGNOSIS — I2510 Atherosclerotic heart disease of native coronary artery without angina pectoris: Secondary | ICD-10-CM | POA: Insufficient documentation

## 2013-09-19 DIAGNOSIS — J4489 Other specified chronic obstructive pulmonary disease: Secondary | ICD-10-CM | POA: Insufficient documentation

## 2013-09-19 DIAGNOSIS — Z7982 Long term (current) use of aspirin: Secondary | ICD-10-CM | POA: Insufficient documentation

## 2013-09-19 DIAGNOSIS — M898X0 Other specified disorders of bone, multiple sites: Secondary | ICD-10-CM

## 2013-09-19 DIAGNOSIS — J449 Chronic obstructive pulmonary disease, unspecified: Secondary | ICD-10-CM | POA: Insufficient documentation

## 2013-09-19 DIAGNOSIS — R220 Localized swelling, mass and lump, head: Secondary | ICD-10-CM | POA: Insufficient documentation

## 2013-09-19 DIAGNOSIS — R221 Localized swelling, mass and lump, neck: Secondary | ICD-10-CM

## 2013-09-19 HISTORY — DX: Anemia, unspecified: D64.9

## 2013-09-19 HISTORY — DX: Anxiety disorder, unspecified: F41.9

## 2013-09-19 HISTORY — DX: Sleep apnea, unspecified: G47.30

## 2013-09-19 HISTORY — PX: MULTIPLE EXTRACTIONS WITH ALVEOLOPLASTY: SHX5342

## 2013-09-19 HISTORY — DX: Acute myocardial infarction, unspecified: I21.9

## 2013-09-19 HISTORY — DX: Gastro-esophageal reflux disease without esophagitis: K21.9

## 2013-09-19 LAB — BASIC METABOLIC PANEL
BUN: 11 mg/dL (ref 6–23)
CALCIUM: 8.9 mg/dL (ref 8.4–10.5)
CO2: 22 mEq/L (ref 19–32)
CREATININE: 0.8 mg/dL (ref 0.50–1.35)
Chloride: 105 mEq/L (ref 96–112)
GFR calc non Af Amer: >90 mL/min (ref >90)
GLUCOSE: 112 mg/dL — AB (ref 70–99)
Potassium: 3.7 mEq/L (ref 3.7–5.3)
Sodium: 138 mEq/L (ref 137–147)

## 2013-09-19 LAB — CBC
HEMATOCRIT: 35.8 % — AB (ref 39.0–52.0)
HEMOGLOBIN: 12.2 g/dL — AB (ref 13.0–17.0)
MCH: 31.3 pg (ref 26.0–34.0)
MCHC: 34.1 g/dL (ref 30.0–36.0)
MCV: 91.8 fL (ref 78.0–100.0)
Platelets: 264 10*3/uL (ref 150–400)
RBC: 3.9 MIL/uL — ABNORMAL LOW (ref 4.22–5.81)
RDW: 12.4 % (ref 11.5–15.5)
WBC: 9 10*3/uL (ref 4.0–10.5)

## 2013-09-19 SURGERY — MULTIPLE EXTRACTION WITH ALVEOLOPLASTY
Anesthesia: General | Site: Mouth

## 2013-09-19 MED ORDER — FENTANYL CITRATE 0.05 MG/ML IJ SOLN
INTRAMUSCULAR | Status: AC
  Filled 2013-09-19: qty 5

## 2013-09-19 MED ORDER — KETOROLAC TROMETHAMINE 30 MG/ML IJ SOLN
INTRAMUSCULAR | Status: DC | PRN
  Administered 2013-09-19: 30 mg via INTRAVENOUS

## 2013-09-19 MED ORDER — PROPOFOL 10 MG/ML IV BOLUS
INTRAVENOUS | Status: DC | PRN
  Administered 2013-09-19: 200 mg via INTRAVENOUS

## 2013-09-19 MED ORDER — LACTATED RINGERS IV SOLN: INTRAVENOUS | Status: DC

## 2013-09-19 MED ORDER — DEXAMETHASONE SODIUM PHOSPHATE 10 MG/ML IJ SOLN
INTRAMUSCULAR | Status: AC
  Filled 2013-09-19: qty 1

## 2013-09-19 MED ORDER — EPHEDRINE SULFATE 50 MG/ML IJ SOLN
INTRAMUSCULAR | Status: AC
  Filled 2013-09-19: qty 1

## 2013-09-19 MED ORDER — SUCCINYLCHOLINE CHLORIDE 20 MG/ML IJ SOLN
INTRAMUSCULAR | Status: DC | PRN
  Administered 2013-09-19: 100 mg via INTRAVENOUS

## 2013-09-19 MED ORDER — BUPIVACAINE-EPINEPHRINE PF 0.5-1:200000 % IJ SOLN
INTRAMUSCULAR | Status: AC
  Filled 2013-09-19: qty 7.2

## 2013-09-19 MED ORDER — DEXAMETHASONE SODIUM PHOSPHATE 10 MG/ML IJ SOLN
INTRAMUSCULAR | Status: DC | PRN
  Administered 2013-09-19: 10 mg via INTRAVENOUS

## 2013-09-19 MED ORDER — HYDROMORPHONE HCL PF 1 MG/ML IJ SOLN: 0.2500 mg | INTRAMUSCULAR | Status: DC | PRN

## 2013-09-19 MED ORDER — LIDOCAINE HCL (CARDIAC) 20 MG/ML IV SOLN
INTRAVENOUS | Status: DC | PRN
  Administered 2013-09-19: 100 mg via INTRAVENOUS

## 2013-09-19 MED ORDER — CLINDAMYCIN PHOSPHATE 900 MG/50ML IV SOLN
INTRAVENOUS | Status: AC
  Filled 2013-09-19: qty 50

## 2013-09-19 MED ORDER — SUCCINYLCHOLINE CHLORIDE 20 MG/ML IJ SOLN
INTRAMUSCULAR | Status: AC
  Filled 2013-09-19: qty 1

## 2013-09-19 MED ORDER — CISATRACURIUM BESYLATE (PF) 10 MG/5ML IV SOLN
INTRAVENOUS | Status: DC | PRN
  Administered 2013-09-19: 8 mg via INTRAVENOUS

## 2013-09-19 MED ORDER — SODIUM CHLORIDE 0.9 % IJ SOLN
INTRAMUSCULAR | Status: AC
  Filled 2013-09-19: qty 10

## 2013-09-19 MED ORDER — EPHEDRINE SULFATE 50 MG/ML IJ SOLN
INTRAMUSCULAR | Status: DC | PRN
  Administered 2013-09-19 (×2): 10 mg via INTRAVENOUS

## 2013-09-19 MED ORDER — 0.9 % SODIUM CHLORIDE (POUR BTL) OPTIME
TOPICAL | Status: DC | PRN
  Administered 2013-09-19: 1000 mL

## 2013-09-19 MED ORDER — MIDAZOLAM HCL 5 MG/5ML IJ SOLN
INTRAMUSCULAR | Status: DC | PRN
  Administered 2013-09-19: 2 mg via INTRAVENOUS

## 2013-09-19 MED ORDER — OXYCODONE HCL 5 MG PO TABS: 5.0000 mg | ORAL_TABLET | ORAL | Status: DC | PRN

## 2013-09-19 MED ORDER — LIDOCAINE HCL (CARDIAC) 20 MG/ML IV SOLN
INTRAVENOUS | Status: AC
  Filled 2013-09-19: qty 5

## 2013-09-19 MED ORDER — LIDOCAINE-EPINEPHRINE 2 %-1:100000 IJ SOLN
INTRAMUSCULAR | Status: AC
  Filled 2013-09-19: qty 10.2

## 2013-09-19 MED ORDER — NEOSTIGMINE METHYLSULFATE 1 MG/ML IJ SOLN
INTRAMUSCULAR | Status: DC | PRN
  Administered 2013-09-19: 4 mg via INTRAVENOUS

## 2013-09-19 MED ORDER — LIDOCAINE-EPINEPHRINE 2 %-1:100000 IJ SOLN
INTRAMUSCULAR | Status: DC | PRN
  Administered 2013-09-19 (×6): 1.7 mL via INTRADERMAL

## 2013-09-19 MED ORDER — LACTATED RINGERS IV SOLN
INTRAVENOUS | Status: DC | PRN
  Administered 2013-09-19 (×2): via INTRAVENOUS

## 2013-09-19 MED ORDER — BUPIVACAINE-EPINEPHRINE PF 0.5-1:200000 % IJ SOLN
INTRAMUSCULAR | Status: DC | PRN
  Administered 2013-09-19 (×2): 1.8 mL via PERINEURAL

## 2013-09-19 MED ORDER — OXYCODONE HCL 5 MG/5ML PO SOLN
5.0000 mg | Freq: Once | ORAL | Status: DC | PRN
  Filled 2013-09-19: qty 5

## 2013-09-19 MED ORDER — MEPERIDINE HCL 50 MG/ML IJ SOLN: 6.2500 mg | INTRAMUSCULAR | Status: DC | PRN

## 2013-09-19 MED ORDER — ONDANSETRON HCL 4 MG/2ML IJ SOLN
INTRAMUSCULAR | Status: DC | PRN
  Administered 2013-09-19: 4 mg via INTRAVENOUS

## 2013-09-19 MED ORDER — MIDAZOLAM HCL 2 MG/2ML IJ SOLN
INTRAMUSCULAR | Status: AC
  Filled 2013-09-19: qty 2

## 2013-09-19 MED ORDER — ISOPROPYL ALCOHOL 70 % SOLN
Status: DC | PRN
  Administered 2013-09-19: 1 via TOPICAL

## 2013-09-19 MED ORDER — ISOPROPYL ALCOHOL 70 % SOLN
Status: AC
  Filled 2013-09-19: qty 480

## 2013-09-19 MED ORDER — CLINDAMYCIN PHOSPHATE 900 MG/50ML IV SOLN
900.0000 mg | INTRAVENOUS | Status: AC
  Administered 2013-09-19: 900 mg via INTRAVENOUS
  Filled 2013-09-19: qty 50

## 2013-09-19 MED ORDER — METOPROLOL TARTRATE 12.5 MG HALF TABLET
12.5000 mg | ORAL_TABLET | Freq: Once | ORAL | Status: AC
  Administered 2013-09-19: 12.5 mg via ORAL
  Filled 2013-09-19: qty 1

## 2013-09-19 MED ORDER — PROPOFOL 10 MG/ML IV BOLUS
INTRAVENOUS | Status: AC
  Filled 2013-09-19: qty 20

## 2013-09-19 MED ORDER — ONDANSETRON HCL 4 MG/2ML IJ SOLN
INTRAMUSCULAR | Status: AC
  Filled 2013-09-19: qty 2

## 2013-09-19 MED ORDER — CISATRACURIUM BESYLATE 20 MG/10ML IV SOLN
INTRAVENOUS | Status: AC
  Filled 2013-09-19: qty 10

## 2013-09-19 MED ORDER — GLYCOPYRROLATE 0.2 MG/ML IJ SOLN
INTRAMUSCULAR | Status: DC | PRN
  Administered 2013-09-19: .6 mg via INTRAVENOUS

## 2013-09-19 MED ORDER — OXYMETAZOLINE HCL 0.05 % NA SOLN
NASAL | Status: DC | PRN
  Administered 2013-09-19 (×3): 1 via NASAL

## 2013-09-19 MED ORDER — PROMETHAZINE HCL 25 MG/ML IJ SOLN
6.2500 mg | INTRAMUSCULAR | Status: DC | PRN
Start: 2013-09-19 — End: 2013-09-19

## 2013-09-19 MED ORDER — KETOROLAC TROMETHAMINE 30 MG/ML IJ SOLN
INTRAMUSCULAR | Status: AC
  Filled 2013-09-19: qty 1

## 2013-09-19 MED ORDER — GLYCOPYRROLATE 0.2 MG/ML IJ SOLN
INTRAMUSCULAR | Status: AC
  Filled 2013-09-19: qty 3

## 2013-09-19 MED ORDER — OXYCODONE-ACETAMINOPHEN 5-325 MG PO TABS: 1.0000 | ORAL_TABLET | ORAL | Status: DC | PRN

## 2013-09-19 MED ORDER — OXYMETAZOLINE HCL 0.05 % NA SOLN
NASAL | Status: AC
  Filled 2013-09-19: qty 15

## 2013-09-19 MED ORDER — OXYCODONE HCL 5 MG PO TABS: 5.0000 mg | ORAL_TABLET | Freq: Once | ORAL | Status: DC | PRN

## 2013-09-19 MED ORDER — FENTANYL CITRATE 0.05 MG/ML IJ SOLN
INTRAMUSCULAR | Status: DC | PRN
  Administered 2013-09-19: 150 ug via INTRAVENOUS
  Administered 2013-09-19 (×2): 50 ug via INTRAVENOUS

## 2013-09-19 SURGICAL SUPPLY — 26 items
ATTRACTOMAT 16X20 MAGNETIC DRP (DRAPES) ×3 IMPLANT
BAG SPEC THK2 15X12 ZIP CLS (MISCELLANEOUS) ×1
BAG ZIPLOCK 12X15 (MISCELLANEOUS) ×3 IMPLANT
BANDAGE EYE OVAL (MISCELLANEOUS) IMPLANT
BLADE SURG 15 STRL LF DISP TIS (BLADE) ×2 IMPLANT
BLADE SURG 15 STRL SS (BLADE) ×6
CANNULA VESSEL W/WING WO/VALVE (CANNULA) ×6 IMPLANT
GAUZE SPONGE 4X4 16PLY XRAY LF (GAUZE/BANDAGES/DRESSINGS) ×3 IMPLANT
GLOVE SURG ORTHO 8.0 STRL STRW (GLOVE) ×3 IMPLANT
GLOVE SURG SS PI 6.5 STRL IVOR (GLOVE) ×3 IMPLANT
GOWN STRL REUS W/TWL 2XL LVL3 (GOWN DISPOSABLE) ×3 IMPLANT
GOWN STRL REUS W/TWL LRG LVL3 (GOWN DISPOSABLE) ×3 IMPLANT
KIT BASIN OR (CUSTOM PROCEDURE TRAY) ×3 IMPLANT
NS IRRIG 1000ML POUR BTL (IV SOLUTION) ×3 IMPLANT
PACK EENT SPLIT (PACKS) ×3 IMPLANT
PACKING VAGINAL (PACKING) ×3 IMPLANT
SPONGE GAUZE 4X4 12PLY (GAUZE/BANDAGES/DRESSINGS) ×3 IMPLANT
SUCTION FRAZIER 12FR DISP (SUCTIONS) ×3 IMPLANT
SUT CHROMIC 3 0 PS 2 (SUTURE) ×12 IMPLANT
SUT CHROMIC 4 0 P 3 18 (SUTURE) IMPLANT
SYR 50ML LL SCALE MARK (SYRINGE) ×3 IMPLANT
TOWEL OR 17X26 10 PK STRL BLUE (TOWEL DISPOSABLE) ×3 IMPLANT
TUBING CONNECTING 10 (TUBING) ×2 IMPLANT
TUBING CONNECTING 10' (TUBING) ×1
WATER STERILE IRR 1500ML POUR (IV SOLUTION) ×3 IMPLANT
YANKAUER SUCT BULB TIP NO VENT (SUCTIONS) ×3 IMPLANT

## 2013-09-19 NOTE — Anesthesia Preprocedure Evaluation (Addendum)
Anesthesia Evaluation  Patient identified by MRN, date of birth, ID band Patient awake    Reviewed: Allergy & Precautions, H&P , NPO status , Patient's Chart, lab work & pertinent test results  Airway Mallampati: II TM Distance: >3 FB Neck ROM: Full    Dental  (+) Dental Advisory Given   Pulmonary sleep apnea , COPDCurrent Smoker, former smoker,  breath sounds clear to auscultation        Cardiovascular hypertension, Pt. on medications - angina+ CAD, + Past MI, + Cardiac Stents and + CABG Rhythm:Regular Rate:Normal  S/P 4v CABG 08/2013  Echo 08/09/2013 - Procedure narrative: Transthoracic echocardiography. Image  quality was adequate. The study was technically difficult. - Left ventricle: The cavity size was normal. Systolic   function was normal. The estimated ejection fraction was   in the range of 55% to 60%. Wall motion was normal; there  were no regional wall motion abnormalities. - Atrial septum: There was increased thickness of the   septum, consistent with lipomatous hypertrophy. - Pulmonary arteries: PA peak pressure: 22mm Hg (S). - Impressions: Suboptimal images Subcostal window best   Neuro/Psych Anxiety negative neurological ROS     GI/Hepatic Neg liver ROS, GERD-  Medicated,  Endo/Other  negative endocrine ROS  Renal/GU negative Renal ROS     Musculoskeletal negative musculoskeletal ROS (+)   Abdominal   Peds  Hematology  (+) anemia ,   Anesthesia Other Findings   Reproductive/Obstetrics negative OB ROS                          Anesthesia Physical  Anesthesia Plan  ASA: III  Anesthesia Plan: General   Post-op Pain Management:    Induction: Intravenous  Airway Management Planned: Nasal ETT  Additional Equipment:   Intra-op Plan:   Post-operative Plan: Extubation in OR  Informed Consent: I have reviewed the patients History and Physical, chart, labs and discussed  the procedure including the risks, benefits and alternatives for the proposed anesthesia with the patient or authorized representative who has indicated his/her understanding and acceptance.   Dental advisory given  Plan Discussed with: CRNA  Anesthesia Plan Comments:         Anesthesia Quick Evaluation

## 2013-09-19 NOTE — Discharge Instructions (Signed)

## 2013-09-19 NOTE — Preoperative (Signed)
Beta Blockers   Reason not to administer Beta Blockers:Not Applicable 

## 2013-09-19 NOTE — Transfer of Care (Signed)
Immediate Anesthesia Transfer of Care Note  Patient: Erik Alvarado  Procedure(s) Performed: Procedure(s): MULTIPLE EXTRACTION WITH ALVEOLOPLASTY AND PRE-PROSTHETIC SURGERY  (N/A)  Patient Location: PACU  Anesthesia Type:General  Level of Consciousness: awake, alert  and oriented  Airway & Oxygen Therapy: Patient Spontanous Breathing and Patient connected to face mask oxygen  Post-op Assessment: Report given to PACU RN and Post -op Vital signs reviewed and stable  Post vital signs: Reviewed and stable  Complications: No apparent anesthesia complications

## 2013-09-19 NOTE — Op Note (Signed)
Patient:            Erik Alvarado Date of Birth:  Sep 21, 1950 MRN:                [id number]   DATE OF PROCEDURE:  09/19/2013               OPERATIVE REPORT   PREOPERATIVE DIAGNOSES: 1. Coronary artery disease -status post coronary artery bypass graft procedure 2. Chronic apical periodontitis 3. History of acute pulpitis 4. Chronic periodontitis 5. Bilateral mandibular lingual lateral exostoses  POSTOPERATIVE DIAGNOSES: 1. Coronary artery disease -status post coronary artery bypass graft procedure 2. Chronic apical periodontitis 3. History of acute pulpitis 4. Chronic periodontitis 5. Bilateral mandibular lingual lateral exostoses  OPERATIONS: 1. Multiple extraction of tooth numbers 3, 6, 7, 8, 9, 10, 11, 18, 19, 20, 21, 22, 23, 24, 25, 26, 27, 28, 29, 30, and 31 2. 4 Quadrants of alveoloplasty 3. Bilateral mandibular lingual lateral exostoses reductions   SURGEON: Charlynne Pander, DDS  ASSISTANT: Rory Percy, (dental assistant)  ANESTHESIA: General anesthesia via nasoendotracheal tube.  MEDICATIONS: 1. clindamycin 900 mg IV prior to invasive dental procedures. 2. Local anesthesia with a total utilization of 6 carpules each containing 34 mg of lidocaine with 0.017 mg of epinephrine as well as 2 carpules each containing 9 mg of bupivacaine with 0.009 mg of epinephrine.  SPECIMENS: There are 21 teeth that were discarded.  DRAINS: None  CULTURES: None  COMPLICATIONS: None   ESTIMATED BLOOD LOSS: 100 mLs.  INTRAVENOUS FLUIDS: 1300 mLs of Lactated ringers solution.  INDICATIONS: The patient was recently diagnosed with coronary artery disease. The patient underwent a four-vessel coronary artery bypass graft procedure on 08/10/2013. Patient subsequently developed a toothache and history of right buccal swelling.  A dental consultation was then requested to rule out dental infection that may affect the patient's systemic health and previous coronary artery bypass graft  procedure and put the patient risk for endocarditis.  The patient was examined and treatment planned for extraction remaining teeth with alveoloplasty and pre-prosthetic surgery as indicated.   OPERATIVE FINDINGS: Patient was examined operating room number 11.  The teeth were identified for extraction. The patient was noted be affected by chronic periodontitis, chronic apical periodontitis, history of acute pulpitis, and the presence of bilateral mandibular lingual lateral exostoses.   DESCRIPTION OF PROCEDURE: Patient was brought to the main operating room number 11. Patient was then placed in the supine position on the operating table. General anesthesia was then induced per the anesthesia team. The patient was then prepped and draped in the usual manner for dental medicine procedure. A timeout was performed. The patient was identified and procedures were verified. A throat pack was placed at this time. The oral cavity was then thoroughly examined with the findings noted above. The patient was then ready for dental medicine procedure as follows:  Local anesthesia was then administered sequentially with a total utilization of 6 carpules each containing 34 mg of lidocaine with 0.017 mg of epinephrine as well as 2 carpules  each containing 9 mg bupivacaine with 0.009 mg of epinephrine.  The Maxillary left and right quadrants first approached. Anesthesia was then delivered utilizing infiltration with lidocaine with epinephrine. A #15 blade incision was then made from the maxillary right tuberosity and extended to the distal of #13.  A  surgical flap was then carefully reflected. Appropriate amounts of buccal and interseptal bone were then removed utilizing a surgical handpiece and bur and copious  amounts of sterile water.  The teeth were then subluxated with a series of straight elevators. Tooth numbers 3, 6, 7, 8, 9, 10, and 11 were then removed with a 150 forceps.  A retained root in the area of numbers 6  was noted. Further bone was then removed around retained root and this was then elevated out with a series of cryers elevators without complication. Alveoloplasty was then performed utilizing a ronguers and bone file. The surgical site was then irrigated with copious amounts of sterile saline. The tissues were approximated and trimmed appropriately. The maxillary right surgical site was then closed from the maxillary right tuberosity and extended the mesial #8 utilizing 3-0 chromic gut suture in a continuous interrupted suture technique x1. The maxillary left surgical site was then closed from the distal of #13 and extended the mesial #9 utilizing 3-0 chromic gut suture in a continuous interrupted suture technique x1.  At this point time, the mandibular quadrants were approached. The patient was given bilateral inferior alveolar nerve blocks and long buccal nerve blocks utilizing the bupivacaine with epinephrine. Further infiltration was then achieved utilizing the lidocaine with epinephrine. A 15 blade incision was then made from the distal of number #17 and extended to the distal of #32.  A surgical flap was then carefully reflected. Appropriate amounts of buccal and interseptal bone were then removed utilizing a surgical handpiece and copious amount of sterile water. The mandibular teeth were then subluxated with a series of straight elevators. Tooth numbers 18, 19, 30, and 31 were then removed utilizing a 23 forceps without complications. Tooth numbers 20, 21, 22, 23, 24, 25, 26, 27, 28, and 29 were then removed with a 151 forceps without complications. Alveoloplasty was then performed utilizing a rongeurs and bone file. At this point time the lingual flaps were reflected on the mandibular left and mandibular right to expose the multiple lateral exostoses. The lateral exostoses were then reduced utilizing a surgical handpiece and bur and copious amounts of sterile water. Further alveoloplasty was then  performed utilizing a rongeur and bone file. The surgical sites were then irrigated with copious amounts of sterile saline x4. The tissues were approximated and trimmed appropriately. The surgical sites were then again irrigated with copious amounts of sterile saline. The mandibular left surgical site was then closed from the distal of  17 and extended the mesial #24 utilizing 3-0 chromic gut suture in a continuous interrupted suture technique x1.  The mandibular right surgical site was then closed from the distal of 32 and extended to the mesial of #25 utilizing 3-0 chromic gut suture in a continuous interrupted suture technique x1. 4 interrupted sutures were then placed to further close the surgical site as needed.  At this point time, the entire mouth was irrigated with copious amounts of sterile saline. The patient was examined for complications, seeing none, the dental medicine procedure was deemed to be complete. The throat pack was removed at this time. A series of 4 x 4 gauze were placed in the mouth to aid hemostasis. An oral airway was then placed at the request of the anesthesia team. The patient was then handed over to the anesthesia team for final disposition. 30 mg of Toradol was then given by the anesthesia team at this time .  After an appropriate amount of time, the patient was extubated and taken to the postanesthsia care unit with stable vital signs and a good condition. All counts were correct for the dental medicine procedure.  Lenn Cal, DDS.
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
## 2013-09-19 NOTE — Progress Notes (Signed)
PRE-OPERATIVE NOTE:  09/19/2013 [name] [id number]  VITALS: BP 131/77  Pulse 74  Temp(Src) 97.8 F (36.6 C) (Oral)  Resp 18  SpO2 99%  Lab Results  Component Value Date   WBC 9.0 09/19/2013   HGB 12.2* 09/19/2013   HCT 35.8* 09/19/2013   MCV 91.8 09/19/2013   PLT 264 09/19/2013   BMET    Component Value Date/Time   NA 138 09/19/2013 0610   K 3.7 09/19/2013 0610   CL 105 09/19/2013 0610   CO2 22 09/19/2013 0610   GLUCOSE 112* 09/19/2013 0610   BUN 11 09/19/2013 0610   CREATININE 0.80 09/19/2013 0610   CALCIUM 8.9 09/19/2013 0610   GFRNONAA >90 09/19/2013 0610   GFRAA >90 09/19/2013 0610    Lab Results  Component Value Date   INR 1.34 08/10/2013   INR 1.14 08/08/2013   No results found for this basename: PTT     .[name] presents for dental extractions with alveoloplasty and pre-prosthetic surgery as needed in the operating room.   SUBJECTIVE: The patient denies any acute medical or dental changes and agrees to proceed with treatment as planned.  EXAM: No sign of acute dental changes.  ASSESSMENT: Patient is affected by chronic periodontitis, chronic apical periodontitis, history of acute pulpitis, and bilateral mandibular lingual tori/exostotses.  PLAN: Patient agrees to proceed with treatment as planned in the operating room as previously discussed and accepts the risks, benefits, complications of the proposed treatment.   Lenn Cal, DDS

## 2013-09-19 NOTE — Anesthesia Procedure Notes (Addendum)
Procedure Name: Intubation Date/Time: 09/19/2013 7:44 AM Performed by: Danley Danker L Patient Re-evaluated:Patient Re-evaluated prior to inductionOxygen Delivery Method: Circle system utilized Preoxygenation: Pre-oxygenation with 100% oxygen Intubation Type: IV induction Ventilation: Mask ventilation without difficulty and Nasal airway inserted- appropriate to patient size Laryngoscope Size: Sabra Heck and 3 Grade View: Grade II Nasal Tubes: Right, Nasal Rae and Nasal prep performed Tube size: 7.0 mm Number of attempts: 2 Placement Confirmation: ETT inserted through vocal cords under direct vision,  breath sounds checked- equal and bilateral and positive ETCO2 Secured at: 26 cm Tube secured with: Tape Dental Injury: Teeth and Oropharynx as per pre-operative assessment  Comments: Induction performed, # 6, 7 and 8 nasal airways introduced into Right nare without resistance or bleeding.   Performed by: Sharlette Dense

## 2013-09-19 NOTE — Anesthesia Postprocedure Evaluation (Signed)
Anesthesia Post Note  Patient: Erik Alvarado  Procedure(s) Performed: Procedure(s) (LRB): MULTIPLE EXTRACTION WITH ALVEOLOPLASTY AND PRE-PROSTHETIC SURGERY  (N/A)  Anesthesia type: General  Patient location: PACU  Post pain: Pain level controlled  Post assessment: Post-op Vital signs reviewed  Last Vitals: BP 111/66  Pulse 62  Temp(Src) 36.2 C (Axillary)  Resp 16  SpO2 96%  Post vital signs: Reviewed  Level of consciousness: sedated  Complications: No apparent anesthesia complications

## 2013-09-22 ENCOUNTER — Encounter (HOSPITAL_COMMUNITY): Payer: Self-pay | Admitting: Dentistry

## 2013-09-22 ENCOUNTER — Ambulatory Visit: Admit: 2013-09-22 | Payer: Self-pay | Admitting: Dentistry

## 2013-09-22 SURGERY — MULTIPLE EXTRACTION WITH ALVEOLOPLASTY
Anesthesia: General | Site: Mouth

## 2013-09-29 ENCOUNTER — Encounter (INDEPENDENT_AMBULATORY_CARE_PROVIDER_SITE_OTHER): Payer: Self-pay

## 2013-09-29 ENCOUNTER — Ambulatory Visit (HOSPITAL_COMMUNITY): Payer: Medicaid - Dental | Admitting: Dentistry

## 2013-09-29 ENCOUNTER — Encounter (HOSPITAL_COMMUNITY): Payer: Self-pay | Admitting: Dentistry

## 2013-09-29 DIAGNOSIS — K08109 Complete loss of teeth, unspecified cause, unspecified class: Secondary | ICD-10-CM

## 2013-09-29 NOTE — Progress Notes (Signed)
POST OPERATIVE NOTE:  09/29/2013 Erik Alvarado [id number]  VITALS: BP 151/90  Pulse 65  Temp(Src) 98.5 F (36.9 C) (Oral)  LABS:  Lab Results  Component Value Date   WBC 9.0 09/19/2013   HGB 12.2* 09/19/2013   HCT 35.8* 09/19/2013   MCV 91.8 09/19/2013   PLT 264 09/19/2013   BMET    Component Value Date/Time   NA 138 09/19/2013 0610   K 3.7 09/19/2013 0610   CL 105 09/19/2013 0610   CO2 22 09/19/2013 0610   GLUCOSE 112* 09/19/2013 0610   BUN 11 09/19/2013 0610   CREATININE 0.80 09/19/2013 0610   CALCIUM 8.9 09/19/2013 0610   GFRNONAA >90 09/19/2013 0610   GFRAA >90 09/19/2013 0610    Lab Results  Component Value Date   INR 1.34 08/10/2013   INR 1.14 08/08/2013   No results found for this basename: PTT     Patient is status post extraction of remaining teeth with alveoloplasty and pre-prosthetic surgery as indicated in the operating room on 09/19/2013. The patient now presents for evaluation of healing and suture removal as indicated.  SUBJECTIVE: Patient currently denies minimal discomfort. Patient has been using his oxycodone immediate release as needed for pain. Patient is using salt water rinses.   EXAM: There is no sign of infection, heme, or ooze. Sutures are loosely intact. Generalized primary closure is noted.   PROCEDURE: The patient was given a chlorhexidine gluconate rinse for 30 seconds. Sutures were then removed without complication. Patient tolerated the procedure well.  ASSESSMENT: Post operative course is consistent with dental procedures performed in the OR. Patient is now edentulous.  PLAN: 1. Continue salt water rinses as needed to aid healing. 2. Use of mechanical diet as tolerated. 3. Follow up in one month for fabrication of upper and lower complete denture after adequate healing. We'll obtain prior approval from Medicaid in the interim.  Lenn Cal, DDS

## 2013-09-29 NOTE — Patient Instructions (Addendum)
PLAN: 1. Continue salt water rinses as needed to aid healing. 2. Use of mechanical diet as tolerated. 3. Follow up in one month for fabrication of upper and lower complete denture after adequate healing. We'll obtain prior approval from Medicaid in the interim.  Call if problems arise before then Lenn Cal, DDS

## 2013-11-03 ENCOUNTER — Encounter (INDEPENDENT_AMBULATORY_CARE_PROVIDER_SITE_OTHER): Payer: Self-pay

## 2013-11-03 ENCOUNTER — Encounter (HOSPITAL_COMMUNITY): Payer: Self-pay | Admitting: Dentistry

## 2013-11-03 ENCOUNTER — Ambulatory Visit (HOSPITAL_COMMUNITY): Payer: Medicaid - Dental | Admitting: Dentistry

## 2013-11-03 VITALS — BP 130/75 | HR 61 | Temp 98.2°F

## 2013-11-03 DIAGNOSIS — K08109 Complete loss of teeth, unspecified cause, unspecified class: Secondary | ICD-10-CM

## 2013-11-03 DIAGNOSIS — K000 Anodontia: Principal | ICD-10-CM

## 2013-11-03 DIAGNOSIS — Z463 Encounter for fitting and adjustment of dental prosthetic device: Secondary | ICD-10-CM

## 2013-11-03 NOTE — Patient Instructions (Signed)
Return to clinic as scheduled for continued fabrication of upper lower complete dentures. Dr. Kulinski 

## 2013-11-03 NOTE — Progress Notes (Signed)
11/03/2013  Patient:            Erik Alvarado Date of Birth:  October 17, 1950 MRN:                [id number]  BP 130/75  Pulse 61  Temp(Src) 98.2 F (36.8 C) (Oral)   [name] presents for start of upper and lower denture fabrication.  Exam: Patient is edentulous. Discussed procedures involved in upper and lower denture fabrication and prognosis for successful ability to wear dentures. Price for dentures confirmed.  Patient agrees to proceed with upper and lower denture fabrication. We discussed patient trimming his beard for the next several dental appointments. Procedure:  Upper and lower denture primary impressions in alginate. Lab pour. To [facility] for upper and lower denture custom tray fabrication. RTC for upper and lower denture final impressions.  Lenn Cal, DDS

## 2013-11-11 ENCOUNTER — Ambulatory Visit (HOSPITAL_COMMUNITY): Payer: Medicaid - Dental | Admitting: Dentistry

## 2013-11-11 ENCOUNTER — Encounter (HOSPITAL_COMMUNITY): Payer: Self-pay | Admitting: Dentistry

## 2013-11-11 VITALS — BP 118/74 | HR 70 | Temp 98.6°F

## 2013-11-11 DIAGNOSIS — K000 Anodontia: Principal | ICD-10-CM

## 2013-11-11 DIAGNOSIS — K08109 Complete loss of teeth, unspecified cause, unspecified class: Secondary | ICD-10-CM

## 2013-11-11 DIAGNOSIS — Z463 Encounter for fitting and adjustment of dental prosthetic device: Secondary | ICD-10-CM

## 2013-11-11 NOTE — Progress Notes (Signed)
11/11/2013  Patient:            Erik Alvarado Date of Birth:  [birth year] MRN:                [id number]  BP 118/74  Pulse 70  Temp(Src) 98.6 F (37 C) (Oral)  [name] presents for continued upper and lower complete denture fabrication. Procedure:  Upper and lower border molding and final impressions in Aquasil. Patient tolerated procedure well. To [facility] for custom baseplates with rims. Return to clinic for upper and lower complete denture jaw relations.  Lenn Cal, DDS

## 2013-11-11 NOTE — Patient Instructions (Signed)
Return to clinic as scheduled for continued upper lower complete denture fabrication. Dr. Kulinski 

## 2013-11-19 ENCOUNTER — Ambulatory Visit (HOSPITAL_COMMUNITY): Payer: Medicaid - Dental | Admitting: Dentistry

## 2013-11-19 ENCOUNTER — Encounter (HOSPITAL_COMMUNITY): Payer: Self-pay | Admitting: Dentistry

## 2013-11-19 VITALS — BP 118/72 | HR 64 | Temp 98.8°F

## 2013-11-19 DIAGNOSIS — K000 Anodontia: Principal | ICD-10-CM

## 2013-11-19 DIAGNOSIS — Z463 Encounter for fitting and adjustment of dental prosthetic device: Secondary | ICD-10-CM

## 2013-11-19 DIAGNOSIS — K08109 Complete loss of teeth, unspecified cause, unspecified class: Secondary | ICD-10-CM

## 2013-11-19 NOTE — Patient Instructions (Signed)
Return to clinic as scheduled for continued upper lower complete denture fabrication. Dr. Hawraa Stambaugh 

## 2013-11-19 NOTE — Progress Notes (Signed)
11/19/2013  Patient:            Erik Alvarado Date of Birth:  [birth year] MRN:                [id number]  BP 118/72  Pulse 64  Temp(Src) 98.8 F (37.1 C) (Oral)  [name] presents for continued denture fabrication. Procedure:  Upper and lower denture Jaw relations with aluwax bite registration. Patient agrees to tooth selection of 22 E, H, and 10 degree posteriors to match with Portrait A2 shade. Discussed use of 12 E teeth and patient desired square taper 22 E tooth. Patient tolerated procedure well. RTC for denture wax try in.   Lenn Cal, DDS

## 2013-12-01 ENCOUNTER — Encounter (HOSPITAL_COMMUNITY): Payer: Self-pay | Admitting: Dentistry

## 2013-12-01 ENCOUNTER — Ambulatory Visit (HOSPITAL_COMMUNITY): Payer: Medicaid - Dental | Admitting: Dentistry

## 2013-12-01 VITALS — BP 156/87 | HR 52 | Temp 97.7°F

## 2013-12-01 DIAGNOSIS — K08109 Complete loss of teeth, unspecified cause, unspecified class: Secondary | ICD-10-CM

## 2013-12-01 DIAGNOSIS — K000 Anodontia: Principal | ICD-10-CM

## 2013-12-01 DIAGNOSIS — Z463 Encounter for fitting and adjustment of dental prosthetic device: Secondary | ICD-10-CM

## 2013-12-01 NOTE — Progress Notes (Signed)
12/01/2013  Patient:            Erik Alvarado Date of Birth:  [birth year] MRN:                [id number]  BP 156/87  Pulse 52  Temp(Src) 97.7 F (36.5 C) (Oral)  [name] presents for continued upper and lower denture fabrication. Procedure:  Upper and lower denture wax tryin. The patient had a characterize set up with slight rotation of tooth numbers 7 and 10 by his request. Patient accepts esthetics, phonetics, fit and function. Patient agrees to process "as is" in [place] [address]. Patient to RTC for  upper and lower denture insertion.  Lenn Cal, DDS

## 2013-12-01 NOTE — Patient Instructions (Signed)
Return to clinic as scheduled for insertion of upper lower complete dentures. Dr. Zylan Almquist 

## 2013-12-10 ENCOUNTER — Ambulatory Visit (HOSPITAL_COMMUNITY): Payer: Medicaid - Dental | Admitting: Dentistry

## 2013-12-10 ENCOUNTER — Encounter (HOSPITAL_COMMUNITY): Payer: Self-pay | Admitting: Dentistry

## 2013-12-10 ENCOUNTER — Encounter (INDEPENDENT_AMBULATORY_CARE_PROVIDER_SITE_OTHER): Payer: Self-pay

## 2013-12-10 VITALS — BP 147/86 | HR 49 | Temp 98.6°F

## 2013-12-10 DIAGNOSIS — Z463 Encounter for fitting and adjustment of dental prosthetic device: Secondary | ICD-10-CM

## 2013-12-10 DIAGNOSIS — K000 Anodontia: Principal | ICD-10-CM

## 2013-12-10 DIAGNOSIS — K08109 Complete loss of teeth, unspecified cause, unspecified class: Secondary | ICD-10-CM

## 2013-12-10 NOTE — Patient Instructions (Signed)
Instructions for Denture Use and Care  Congratulations, you are on the way to oral rehabilitation!  You have just received a new set of complete or partial dentures.  These prostheses will help to improve both your appearance and chewing ability.  These instructions will help you get adjusted to your dentures as well as care for them properly.  Please read these instructions carefully and completely as soon as you get home.  If you or your caregiver have any questions please notify the [facility] at [phone].  HOW YOUR DENTURES LOOK AND FEEL Soon after you begin wearing your dentures, you may feel that your dentures are too large or even loose.  As our mouth and facial muscles become accustomed to the dentures, these feelings will go away.  You also may feel that you are salivating more than you normally do.  This feeling should go away as you get used to having the dentures in your mouth.  You may bite your cheek or your tongue; this will eventually resolve itself as you wear your dentures.  Some soreness is to be expected, but you should not hurt.  If your mouth hurts, call your dentist.  A denture adhesive may occasionally be necessary to hold your dentures in place more securely.  The dentist will let you know when one is recommended for you.  SPEAKING Wearing dentures will change the sound of your voice initially.  This will be noticed by you more than anyone else.  Bite and swallow before you speak, in order to place your dentures in position so that you may speak more clearly.  Practice speaking by reading aloud or counting from 1 to 100 very slowly and distinctly.  After some practice your mouth will become accustomed to your dentures and you will speak more clearly.  EATING Chewing will definitely be different after you receive your dentures.  With a little practice and patience you should be able to eat just about any kind of food.  Begin by eating small quantities of food  that are cut into small pieces.  Star with soft foods such as eggs, cooked vegetables, or puddings.  As you gain confidence advance  Your diet to whatever texture foods you can tolerate.  DENTURE CARE Dentures can collect plaque and calculus much the same as natural teeth can.  If not removed on a regular basis, your dentures will not look or feel clean, and you will experience denture odor.  It is very important that you remove your dentures at bedtime and clean them thoroughly.  You should: 1. Clean your dentures over a sink full of water so if dropped, breakage will be prevented. 2. Rinse your dentures with cool water to remove any large food particles. 3. Use soap and water or a denture cleanser or paste to clean the dentures.  Do not use regular toothpaste as it may abrade the denture base or teeth. 4. Use a moistened denture brush to clean all surfaces (inside and outside). 5. Rinse thoroughly to remove any remaining soap or denture cleanser. 6. Use a soft bristle toothbrush to gently brush any natural teeth, gums, tongue, and palate at bedtime and before reinserting your dentures. 7. Do not sleep with your dentures in your mouth at night.  Remove your dentures and soak them overnight in a denture cup filled with water or denture solution as recommended by your dentist.  This routine will become second nature and will increase the life and comfort   of your dentures.  Please do not try to adjust these dentures yourself; you could damage them.  FOLLOW-UP You should call or make an appointment with your dentist.  Your dentist would like to see you at least once a year for a check-up and examination. 

## 2013-12-10 NOTE — Progress Notes (Signed)
12/10/2013  Patient:            Erik Alvarado Date of Birth:  Apr 16, 1951 MRN:                [id number]  BP 147/86  Pulse 49  Temp(Src) 98.6 F (37 C) (Oral)  [name] presents for insertion of upper and lower complete dentures. Procedure: Pressure indicating paste was applied to the dentures. Adjustments were made as needed. [place] Occlusion evaluated and adjustments made as needed for Centric Relation and protrusive strokes. Good esthetics, phonetics, fit, and function noted. Patient accepts results. Post op instructions provided in written and verbal formats on use and care of dentures. Gave patient denture brush and cup. Patient to keep dentures out if sore spots develop. Use salt water rinses as needed to aid healing. Return to clinic as scheduled for denture adjustment.  Call if problems arise before then. Patient dismissed in stable condition.  Lenn Cal, DDS

## 2013-12-16 ENCOUNTER — Encounter (INDEPENDENT_AMBULATORY_CARE_PROVIDER_SITE_OTHER): Payer: Self-pay

## 2013-12-16 ENCOUNTER — Ambulatory Visit (HOSPITAL_COMMUNITY): Payer: Medicaid - Dental | Admitting: Dentistry

## 2013-12-16 ENCOUNTER — Encounter (HOSPITAL_COMMUNITY): Payer: Self-pay | Admitting: Dentistry

## 2013-12-16 VITALS — BP 146/71 | HR 60 | Temp 98.2°F

## 2013-12-16 DIAGNOSIS — Z972 Presence of dental prosthetic device (complete) (partial): Secondary | ICD-10-CM

## 2013-12-16 DIAGNOSIS — Z463 Encounter for fitting and adjustment of dental prosthetic device: Secondary | ICD-10-CM

## 2013-12-16 DIAGNOSIS — K000 Anodontia: Principal | ICD-10-CM

## 2013-12-16 DIAGNOSIS — K08109 Complete loss of teeth, unspecified cause, unspecified class: Secondary | ICD-10-CM

## 2013-12-16 DIAGNOSIS — K062 Gingival and edentulous alveolar ridge lesions associated with trauma: Secondary | ICD-10-CM

## 2013-12-16 NOTE — Progress Notes (Signed)
12/16/2013  Patient:            Erik Alvarado Date of Birth:  June 12, 1951 MRN:                [id number]  BP 146/71  Pulse 60  Temp(Src) 98.2 F (36.8 C) (Oral)  Erik Alvarado presents for evaluation of recently inserted upper and lower complete dentures.  SUBJECTIVE: The patient is complaining of denture irritation involving the lower left quadrant.-lingua aspect. OBJECTIVE: There is no sign of denture irritation or ulceration. Procedure: Pressure indicating paste was applied to the dentures. Adjustments were made as needed.  Thick PIP applied to denture borders and borders were adjusted as needed. [place] Occlusion evaluated and adjustments made as needed for Centric Relation and protrusive strokes. Patient accepts results. Patient to keep dentures out if sore spots develop. Use salt water rinses as needed to aid healing. Return to clinic as scheduled for denture adjustment.  Call if problems arise before then. Patient dismissed in stable condition.  Lenn Cal, DDS

## 2013-12-16 NOTE — Patient Instructions (Signed)
Use salt water rinses as needed to aid healing. Return to clinic as scheduled for denture adjustment. Call if problems arise before then. Dr. Enrique Sack

## 2013-12-30 ENCOUNTER — Encounter (HOSPITAL_COMMUNITY): Payer: Self-pay | Admitting: Dentistry

## 2013-12-30 ENCOUNTER — Encounter (INDEPENDENT_AMBULATORY_CARE_PROVIDER_SITE_OTHER): Payer: Self-pay

## 2013-12-30 ENCOUNTER — Ambulatory Visit (HOSPITAL_COMMUNITY): Payer: Medicaid - Dental | Admitting: Dentistry

## 2013-12-30 VITALS — BP 132/92 | HR 79 | Temp 98.1°F

## 2013-12-30 DIAGNOSIS — K08109 Complete loss of teeth, unspecified cause, unspecified class: Secondary | ICD-10-CM

## 2013-12-30 DIAGNOSIS — Z463 Encounter for fitting and adjustment of dental prosthetic device: Secondary | ICD-10-CM

## 2013-12-30 DIAGNOSIS — Z972 Presence of dental prosthetic device (complete) (partial): Secondary | ICD-10-CM

## 2013-12-30 DIAGNOSIS — K000 Anodontia: Principal | ICD-10-CM

## 2013-12-30 NOTE — Patient Instructions (Signed)
Patient to keep dentures out if sore spots develop. Use salt water rinses as needed to aid healing. Return to clinic as scheduled for denture adjustment.   Call if problems arise before then.  Ronald F. Kulinski, DDS  

## 2013-12-30 NOTE — Progress Notes (Signed)
12/30/2013  Patient:            Erik Alvarado Date of Birth:  [birth year] MRN:                [id number]  BP 132/92  Pulse 79  Temp(Src) 98.1 F (36.7 C) (Oral)  Erik Alvarado presents for evaluation of recently inserted upper and lower complete dentures.  SUBJECTIVE: The patient is complaining of problems related to his "bite". OBJECTIVE: There is no sign of denture irritation or ulceration. Procedure: Pressure indicating paste was applied to the dentures. Adjustments were made as needed. [place] Occlusion was evaluated and adjustments made as needed for Centric Relation and protrusive strokes. The patient has a tendency to her to his lower jaw. Patient was instructed on finding the maximum intercuspation position. Patient accepts results. Patient to keep dentures out if sore spots develop. Use salt water rinses as needed to aid healing. Return to clinic as scheduled for denture adjustment.  Call if problems arise before then. Patient dismissed in stable condition.  Lenn Cal, DDS

## 2014-02-16 ENCOUNTER — Encounter (INDEPENDENT_AMBULATORY_CARE_PROVIDER_SITE_OTHER): Payer: Self-pay

## 2014-02-16 ENCOUNTER — Ambulatory Visit (HOSPITAL_COMMUNITY): Payer: Medicaid - Dental | Admitting: Dentistry

## 2014-02-16 ENCOUNTER — Encounter (HOSPITAL_COMMUNITY): Payer: Self-pay | Admitting: Dentistry

## 2014-02-16 VITALS — BP 142/85 | HR 67 | Temp 98.8°F

## 2014-02-16 DIAGNOSIS — K08109 Complete loss of teeth, unspecified cause, unspecified class: Secondary | ICD-10-CM

## 2014-02-16 DIAGNOSIS — Z972 Presence of dental prosthetic device (complete) (partial): Secondary | ICD-10-CM

## 2014-02-16 DIAGNOSIS — K062 Gingival and edentulous alveolar ridge lesions associated with trauma: Secondary | ICD-10-CM

## 2014-02-16 DIAGNOSIS — K000 Anodontia: Secondary | ICD-10-CM

## 2014-02-16 DIAGNOSIS — Z463 Encounter for fitting and adjustment of dental prosthetic device: Secondary | ICD-10-CM

## 2014-02-16 NOTE — Progress Notes (Signed)
02/16/2014  Patient:            Erik Alvarado Date of Birth:  December 07, 1950 MRN:                [id number]  BP 142/85  Pulse 67  Temp(Src) 98.8 F (37.1 C) (Oral)  [name] presents for evaluation of upper and lower complete dentures.  SUBJECTIVE: The patient is complaining of denture irritation to the lower right lingual alveolar ridge area #27-28.Marland Kitchen OBJECTIVE: There is no sign of denture irritation or ulceration. Procedure: Pressure indicating paste was applied to the dentures. Adjustments were made as needed. [place] Occlusion was evaluated and adjustments made as needed for Centric Relation and protrusive strokes. The patient has a tendency to protrude his lower jaw. Patient was again instructed on finding the maximum intercuspation position. Patient accepts results. Patient to keep dentures out if sore spots develop. Use salt water rinses as needed to aid healing. Return to clinic as scheduled for denture adjustment.  Call if problems arise before then. Patient dismissed in stable condition.  Lenn Cal, DDS

## 2014-02-16 NOTE — Patient Instructions (Signed)
Instructions for Denture Use and Care  Congratulations, you are on the way to oral rehabilitation!  You have just received a new set of complete or partial dentures.  These prostheses will help to improve both your appearance and chewing ability.  These instructions will help you get adjusted to your dentures as well as care for them properly.  Please read these instructions carefully and completely as soon as you get home.  If you or your caregiver have any questions please notify the [facility] at [phone].  HOW YOUR DENTURES LOOK AND FEEL Soon after you begin wearing your dentures, you may feel that your dentures are too large or even loose.  As our mouth and facial muscles become accustomed to the dentures, these feelings will go away.  You also may feel that you are salivating more than you normally do.  This feeling should go away as you get used to having the dentures in your mouth.  You may bite your cheek or your tongue; this will eventually resolve itself as you wear your dentures.  Some soreness is to be expected, but you should not hurt.  If your mouth hurts, call your dentist.  A denture adhesive may occasionally be necessary to hold your dentures in place more securely.  The dentist will let you know when one is recommended for you.  SPEAKING Wearing dentures will change the sound of your voice initially.  This will be noticed by you more than anyone else.  Bite and swallow before you speak, in order to place your dentures in position so that you may speak more clearly.  Practice speaking by reading aloud or counting from 1 to 100 very slowly and distinctly.  After some practice your mouth will become accustomed to your dentures and you will speak more clearly.  EATING Chewing will definitely be different after you receive your dentures.  With a little practice and patience you should be able to eat just about any kind of food.  Begin by eating small quantities of food  that are cut into small pieces.  Star with soft foods such as eggs, cooked vegetables, or puddings.  As you gain confidence advance  Your diet to whatever texture foods you can tolerate.  DENTURE CARE Dentures can collect plaque and calculus much the same as natural teeth can.  If not removed on a regular basis, your dentures will not look or feel clean, and you will experience denture odor.  It is very important that you remove your dentures at bedtime and clean them thoroughly.  You should: 1. Clean your dentures over a sink full of water so if dropped, breakage will be prevented. 2. Rinse your dentures with cool water to remove any large food particles. 3. Use soap and water or a denture cleanser or paste to clean the dentures.  Do not use regular toothpaste as it may abrade the denture base or teeth. 4. Use a moistened denture brush to clean all surfaces (inside and outside). 5. Rinse thoroughly to remove any remaining soap or denture cleanser. 6. Use a soft bristle toothbrush to gently brush any natural teeth, gums, tongue, and palate at bedtime and before reinserting your dentures. 7. Do not sleep with your dentures in your mouth at night.  Remove your dentures and soak them overnight in a denture cup filled with water or denture solution as recommended by your dentist.  This routine will become second nature and will increase the life and comfort   of your dentures.  Please do not try to adjust these dentures yourself; you could damage them.  FOLLOW-UP You should call or make an appointment with your dentist.  Your dentist would like to see you at least once a year for a check-up and examination. 

## 2014-03-03 ENCOUNTER — Encounter (HOSPITAL_COMMUNITY): Payer: Self-pay | Admitting: Dentistry

## 2014-04-14 ENCOUNTER — Encounter (INDEPENDENT_AMBULATORY_CARE_PROVIDER_SITE_OTHER): Payer: Self-pay

## 2014-04-14 ENCOUNTER — Ambulatory Visit (HOSPITAL_COMMUNITY): Payer: Medicaid - Dental | Admitting: Dentistry

## 2014-04-14 ENCOUNTER — Encounter (HOSPITAL_COMMUNITY): Payer: Self-pay | Admitting: Dentistry

## 2014-04-14 VITALS — BP 126/83 | HR 52 | Temp 98.2°F

## 2014-04-14 DIAGNOSIS — K1370 Unspecified lesions of oral mucosa: Secondary | ICD-10-CM

## 2014-04-14 DIAGNOSIS — Z463 Encounter for fitting and adjustment of dental prosthetic device: Secondary | ICD-10-CM

## 2014-04-14 DIAGNOSIS — Z972 Presence of dental prosthetic device (complete) (partial): Secondary | ICD-10-CM

## 2014-04-14 DIAGNOSIS — K000 Anodontia: Secondary | ICD-10-CM

## 2014-04-14 DIAGNOSIS — K062 Gingival and edentulous alveolar ridge lesions associated with trauma: Secondary | ICD-10-CM

## 2014-04-14 DIAGNOSIS — K08109 Complete loss of teeth, unspecified cause, unspecified class: Secondary | ICD-10-CM

## 2014-04-14 NOTE — Progress Notes (Signed)
04/14/2014  Patient:            [name] Date of Birth:  10-02-1950 MRN:                [id number]  BP 126/83  Pulse 52  Temp(Src) 98.2 F (36.8 C) (Oral)  [name] presents for evaluation of upper and lower complete dentures.  SUBJECTIVE: The patient is complaining of denture irritation to the lower left alveolar ridge area #22. OBJECTIVE: There is some erythema involving alveolar ridge area #22. No ulcerations noted. Procedure: Pressure indicating paste was applied to the dentures. Adjustments were made as needed. [place] Occlusion was evaluated and adjustments made as needed for Centric Relation and protrusive strokes. The patient has a tendency to protrude his lower jaw. Patient was again instructed on finding the maximum intercuspation position. Patient accepts results. Patient to keep dentures out if sore spots develop. Use salt water rinses as needed to aid healing. Return to clinic as scheduled for denture adjustment.   Call if problems arise before then.  Lenn Cal, DDS

## 2014-04-14 NOTE — Patient Instructions (Signed)
Patient to keep dentures out if sore spots develop. Use salt water rinses as needed to aid healing. Return to clinic as scheduled for denture adjustment.   Call if problems arise before then.  Tierany Appleby F. Daneille Desilva, DDS  

## 2014-04-27 ENCOUNTER — Encounter: Payer: Self-pay | Admitting: *Deleted

## 2014-04-27 ENCOUNTER — Ambulatory Visit (INDEPENDENT_AMBULATORY_CARE_PROVIDER_SITE_OTHER): Payer: Medicare Other | Admitting: Cardiology

## 2014-04-27 ENCOUNTER — Encounter: Payer: Self-pay | Admitting: Cardiology

## 2014-04-27 VITALS — BP 126/80 | HR 50 | Ht 72.0 in | Wt 175.6 lb

## 2014-04-27 DIAGNOSIS — I10 Essential (primary) hypertension: Secondary | ICD-10-CM

## 2014-04-27 DIAGNOSIS — E785 Hyperlipidemia, unspecified: Secondary | ICD-10-CM

## 2014-04-27 DIAGNOSIS — I2510 Atherosclerotic heart disease of native coronary artery without angina pectoris: Secondary | ICD-10-CM

## 2014-04-27 DIAGNOSIS — R0989 Other specified symptoms and signs involving the circulatory and respiratory systems: Secondary | ICD-10-CM

## 2014-04-27 HISTORY — DX: Other specified symptoms and signs involving the circulatory and respiratory systems: R09.89

## 2014-04-27 NOTE — Assessment & Plan Note (Signed)
Patient is describing chest pain after taking metoprolol. We will discontinue this medication and he will monitor blood pressure at home. If it trends up I will add an additional medication most likely an ACE inhibitor.

## 2014-04-27 NOTE — Assessment & Plan Note (Signed)
Continue aspirin and statin. 
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
## 2014-04-27 NOTE — Patient Instructions (Signed)
Your physician wants you to follow-up in: [place] will receive a reminder letter in the mail two months in advance. If you don't receive a letter, please call our office to schedule the follow-up appointment.   STOP METOPROLOL  Your physician has requested that you have an abdominal aorta duplex. During this test, an ultrasound is used to evaluate the aorta. Allow 30 minutes for this exam. Do not eat after midnight the day before and avoid carbonated beverages

## 2014-04-27 NOTE — Assessment & Plan Note (Signed)
Continue statin. Lipids and liver monitored by primary care. 

## 2014-04-27 NOTE — Assessment & Plan Note (Signed)
Schedule abdominal ultrasound to exclude aneurysm.

## 2014-05-15 ENCOUNTER — Ambulatory Visit (HOSPITAL_COMMUNITY)
Admission: RE | Admit: 2014-05-15 | Discharge: 2014-05-15 | Disposition: A | Discharge status: Home / Self Care | Payer: Medicare Other | Source: Ambulatory Visit | Attending: Cardiovascular Disease | Admitting: Cardiovascular Disease

## 2014-05-15 DIAGNOSIS — R0989 Other specified symptoms and signs involving the circulatory and respiratory systems: Secondary | ICD-10-CM | POA: Diagnosis not present

## 2014-05-15 DIAGNOSIS — Z951 Presence of aortocoronary bypass graft: Secondary | ICD-10-CM | POA: Insufficient documentation

## 2014-05-15 NOTE — Progress Notes (Signed)
Aorta Duplex Completed. No evidence of aneurysmal dilatation. Oda Cogan, BS, RDMS, RVT

## 2014-05-19 ENCOUNTER — Ambulatory Visit (HOSPITAL_COMMUNITY): Payer: Medicaid - Dental | Admitting: Dentistry

## 2014-05-19 ENCOUNTER — Encounter (HOSPITAL_COMMUNITY): Payer: Self-pay | Admitting: Dentistry

## 2014-05-19 VITALS — BP 145/79 | HR 70 | Temp 98.3°F

## 2014-05-19 DIAGNOSIS — K08109 Complete loss of teeth, unspecified cause, unspecified class: Secondary | ICD-10-CM

## 2014-05-19 DIAGNOSIS — Z972 Presence of dental prosthetic device (complete) (partial): Secondary | ICD-10-CM

## 2014-05-19 DIAGNOSIS — Z463 Encounter for fitting and adjustment of dental prosthetic device: Secondary | ICD-10-CM

## 2014-05-19 DIAGNOSIS — K000 Anodontia: Principal | ICD-10-CM

## 2014-05-19 NOTE — Patient Instructions (Signed)
Patient to use zinc-free denture adhesive. Patient to keep dentures out if sore spots develop. Use salt water rinses as needed to aid healing. Return to clinic as scheduled for denture adjustment.   Call if problems arise before then.  Lenn Cal, DDS

## 2014-05-19 NOTE — Progress Notes (Signed)
05/19/2014  Patient:            Erik Alvarado Date of Birth:  December 25, 1950 MRN:                [id number]  BP 145/79  Pulse 70  Temp(Src) 98.3 F (36.8 C) (Oral)  [name] presents for evaluation of upper and lower complete dentures.  SUBJECTIVE: The patient is complaining of denture irritation to the upper and lower ridges and wonders if he has a "zinc allergy". OBJECTIVE: There is no sign of denture irration or erythema. Procedure: Pressure indicating paste was applied to the dentures. Adjustments were made as needed. [place] Occlusion was evaluated and adjustments made as needed for Centric Relation and protrusive strokes. The patient still has a tendency to protrude his lower jaw forward. Patient was again instructed on finding the maximum intercuspation position. Patient accepts results. Patient to use zinc-free denture adhesive. Patient to keep dentures out if sore spots develop. Use salt water rinses as needed to aid healing. Return to clinic as scheduled for denture adjustment.   Call if problems arise before then.  Lenn Cal, DDS

## 2014-07-13 ENCOUNTER — Telehealth: Payer: Self-pay | Admitting: Cardiology

## 2014-07-13 NOTE — Telephone Encounter (Signed)
ERROR

## 2014-07-13 NOTE — Telephone Encounter (Signed)
Pt thought he had a clot in his arteries diagnosed at [facility] a few yeard ago, and he wondered if we had any records of him getting a "filter" of some sort. pls advise [phone]

## 2014-07-20 NOTE — Telephone Encounter (Signed)
I spoke with patient and explained that filters are used for preventing blood clots from traveling to the lungs.  Patient has no history of DVT or PE.  He thought that he had a filter in his coronary artery.  I explained that we do place any  "filters" in coronary arteries. He voiced understanding and appreciation.

## 2014-08-20 ENCOUNTER — Encounter (HOSPITAL_COMMUNITY): Payer: Self-pay | Admitting: Cardiovascular Disease

## 2014-09-14 DIAGNOSIS — M25512 Pain in left shoulder: Secondary | ICD-10-CM | POA: Diagnosis not present

## 2014-09-14 DIAGNOSIS — M549 Dorsalgia, unspecified: Secondary | ICD-10-CM | POA: Diagnosis not present

## 2014-09-14 DIAGNOSIS — I10 Essential (primary) hypertension: Secondary | ICD-10-CM | POA: Diagnosis not present

## 2014-09-14 DIAGNOSIS — E785 Hyperlipidemia, unspecified: Secondary | ICD-10-CM | POA: Diagnosis not present

## 2014-09-14 DIAGNOSIS — G479 Sleep disorder, unspecified: Secondary | ICD-10-CM | POA: Diagnosis not present

## 2014-10-15 DIAGNOSIS — M25512 Pain in left shoulder: Secondary | ICD-10-CM | POA: Diagnosis not present

## 2014-10-15 DIAGNOSIS — I10 Essential (primary) hypertension: Secondary | ICD-10-CM | POA: Diagnosis not present

## 2014-10-15 DIAGNOSIS — M549 Dorsalgia, unspecified: Secondary | ICD-10-CM | POA: Diagnosis not present

## 2014-10-15 DIAGNOSIS — G479 Sleep disorder, unspecified: Secondary | ICD-10-CM | POA: Diagnosis not present

## 2014-11-09 DIAGNOSIS — C679 Malignant neoplasm of bladder, unspecified: Secondary | ICD-10-CM | POA: Diagnosis not present

## 2014-11-09 DIAGNOSIS — Z125 Encounter for screening for malignant neoplasm of prostate: Secondary | ICD-10-CM | POA: Diagnosis not present

## 2014-11-09 DIAGNOSIS — N401 Enlarged prostate with lower urinary tract symptoms: Secondary | ICD-10-CM | POA: Diagnosis not present

## 2014-11-12 DIAGNOSIS — E785 Hyperlipidemia, unspecified: Secondary | ICD-10-CM | POA: Diagnosis not present

## 2014-11-16 ENCOUNTER — Ambulatory Visit (HOSPITAL_COMMUNITY): Payer: Medicaid - Dental | Admitting: Dentistry

## 2014-11-16 ENCOUNTER — Encounter (HOSPITAL_COMMUNITY): Payer: Self-pay | Admitting: Dentistry

## 2014-11-16 VITALS — BP 143/84 | HR 56 | Temp 97.7°F

## 2014-11-16 DIAGNOSIS — Z463 Encounter for fitting and adjustment of dental prosthetic device: Secondary | ICD-10-CM

## 2014-11-16 DIAGNOSIS — Z972 Presence of dental prosthetic device (complete) (partial): Secondary | ICD-10-CM

## 2014-11-16 DIAGNOSIS — K000 Anodontia: Principal | ICD-10-CM

## 2014-11-16 DIAGNOSIS — K08109 Complete loss of teeth, unspecified cause, unspecified class: Secondary | ICD-10-CM
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
## 2014-11-16 NOTE — Patient Instructions (Signed)
Instructions for Denture Use and Care  Congratulations, you are on the way to oral rehabilitation!  You have just received a new set of complete or partial dentures.  These prostheses will help to improve both your appearance and chewing ability.  These instructions will help you get adjusted to your dentures as well as care for them properly.  Please read these instructions carefully and completely as soon as you get home.  If you or your caregiver have any questions please notify the [facility] at [phone].  HOW YOUR DENTURES LOOK AND FEEL Soon after you begin wearing your dentures, you may feel that your dentures are too large or even loose.  As our mouth and facial muscles become accustomed to the dentures, these feelings will go away.  You also may feel that you are salivating more than you normally do.  This feeling should go away as you get used to having the dentures in your mouth.  You may bite your cheek or your tongue; this will eventually resolve itself as you wear your dentures.  Some soreness is to be expected, but you should not hurt.  If your mouth hurts, call your dentist.  A denture adhesive may occasionally be necessary to hold your dentures in place more securely.  The dentist will let you know when one is recommended for you.  SPEAKING Wearing dentures will change the sound of your voice initially.  This will be noticed by you more than anyone else.  Bite and swallow before you speak, in order to place your dentures in position so that you may speak more clearly.  Practice speaking by reading aloud or counting from 1 to 100 very slowly and distinctly.  After some practice your mouth will become accustomed to your dentures and you will speak more clearly.  EATING Chewing will definitely be different after you receive your dentures.  With a little practice and patience you should be able to eat just about any kind of food.  Begin by eating small quantities of food  that are cut into small pieces.  Star with soft foods such as eggs, cooked vegetables, or puddings.  As you gain confidence advance  Your diet to whatever texture foods you can tolerate.  DENTURE CARE Dentures can collect plaque and calculus much the same as natural teeth can.  If not removed on a regular basis, your dentures will not look or feel clean, and you will experience denture odor.  It is very important that you remove your dentures at bedtime and clean them thoroughly.  You should: 1. Clean your dentures over a sink full of water so if dropped, breakage will be prevented. 2. Rinse your dentures with cool water to remove any large food particles. 3. Use soap and water or a denture cleanser or paste to clean the dentures.  Do not use regular toothpaste as it may abrade the denture base or teeth. 4. Use a moistened denture brush to clean all surfaces (inside and outside). 5. Rinse thoroughly to remove any remaining soap or denture cleanser. 6. Use a soft bristle toothbrush to gently brush any natural teeth, gums, tongue, and palate at bedtime and before reinserting your dentures. 7. Do not sleep with your dentures in your mouth at night.  Remove your dentures and soak them overnight in a denture cup filled with water or denture solution as recommended by your dentist.  This routine will become second nature and will increase the life and comfort   of your dentures.  Please do not try to adjust these dentures yourself; you could damage them.  FOLLOW-UP You should call or make an appointment with your dentist.  Your dentist would like to see you at least once a year for a check-up and examination. 

## 2014-12-21 DIAGNOSIS — I10 Essential (primary) hypertension: Secondary | ICD-10-CM | POA: Diagnosis not present

## 2014-12-21 DIAGNOSIS — G479 Sleep disorder, unspecified: Secondary | ICD-10-CM | POA: Diagnosis not present

## 2014-12-21 DIAGNOSIS — M549 Dorsalgia, unspecified: Secondary | ICD-10-CM | POA: Diagnosis not present

## 2014-12-21 DIAGNOSIS — M25512 Pain in left shoulder: Secondary | ICD-10-CM | POA: Diagnosis not present

## 2015-01-20 DIAGNOSIS — I10 Essential (primary) hypertension: Secondary | ICD-10-CM | POA: Diagnosis not present

## 2015-01-20 DIAGNOSIS — M549 Dorsalgia, unspecified: Secondary | ICD-10-CM | POA: Diagnosis not present

## 2015-01-20 DIAGNOSIS — M25512 Pain in left shoulder: Secondary | ICD-10-CM | POA: Diagnosis not present

## 2015-01-20 DIAGNOSIS — G479 Sleep disorder, unspecified: Secondary | ICD-10-CM | POA: Diagnosis not present

## 2015-01-27 ENCOUNTER — Telehealth: Payer: Self-pay | Admitting: Cardiology

## 2015-01-28 NOTE — Telephone Encounter (Signed)
Close encounter 
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
## 2015-05-10 DIAGNOSIS — N401 Enlarged prostate with lower urinary tract symptoms: Secondary | ICD-10-CM | POA: Diagnosis not present

## 2015-05-10 DIAGNOSIS — C679 Malignant neoplasm of bladder, unspecified: Secondary | ICD-10-CM | POA: Diagnosis not present

## 2015-05-11 ENCOUNTER — Ambulatory Visit (INDEPENDENT_AMBULATORY_CARE_PROVIDER_SITE_OTHER): Payer: Medicare Other | Admitting: Cardiology

## 2015-05-11 ENCOUNTER — Encounter: Payer: Self-pay | Admitting: Cardiology

## 2015-05-11 VITALS — BP 118/70 | HR 66 | Ht 72.0 in | Wt 192.3 lb

## 2015-05-11 DIAGNOSIS — E785 Hyperlipidemia, unspecified: Secondary | ICD-10-CM

## 2015-05-11 DIAGNOSIS — I2510 Atherosclerotic heart disease of native coronary artery without angina pectoris: Secondary | ICD-10-CM

## 2015-05-11 DIAGNOSIS — I10 Essential (primary) hypertension: Secondary | ICD-10-CM

## 2015-05-11 NOTE — Assessment & Plan Note (Signed)
Continue aspirin and statin. 

## 2015-05-11 NOTE — Patient Instructions (Signed)
Your physician wants you to follow-up in: ONE YEAR WITH DR CRENSHAW You will receive a reminder letter in the mail two months in advance. If you don't receive a letter, please call our office to schedule the follow-up appointment.  

## 2015-05-11 NOTE — Assessment & Plan Note (Signed)
Blood pressure controlled. Continue present medications. 

## 2015-05-11 NOTE — Assessment & Plan Note (Signed)
Continue statin.he did not tolerate high-dose Lipitor previously.

## 2015-05-20 DIAGNOSIS — M5137 Other intervertebral disc degeneration, lumbosacral region: Secondary | ICD-10-CM | POA: Diagnosis not present

## 2015-05-20 DIAGNOSIS — M545 Low back pain: Secondary | ICD-10-CM | POA: Diagnosis not present

## 2015-05-20 DIAGNOSIS — M4807 Spinal stenosis, lumbosacral region: Secondary | ICD-10-CM | POA: Diagnosis not present

## 2015-05-20 DIAGNOSIS — G8929 Other chronic pain: Secondary | ICD-10-CM | POA: Diagnosis not present

## 2015-05-20 DIAGNOSIS — M7502 Adhesive capsulitis of left shoulder: Secondary | ICD-10-CM | POA: Diagnosis not present

## 2015-05-20 DIAGNOSIS — M25511 Pain in right shoulder: Secondary | ICD-10-CM | POA: Diagnosis not present

## 2015-05-20 DIAGNOSIS — M4806 Spinal stenosis, lumbar region: Secondary | ICD-10-CM | POA: Diagnosis not present

## 2015-05-20 DIAGNOSIS — M25512 Pain in left shoulder: Secondary | ICD-10-CM | POA: Diagnosis not present

## 2015-05-21 ENCOUNTER — Encounter: Payer: Self-pay | Admitting: Family Medicine

## 2015-05-21 ENCOUNTER — Ambulatory Visit (INDEPENDENT_AMBULATORY_CARE_PROVIDER_SITE_OTHER): Payer: Medicare Other | Admitting: Family Medicine

## 2015-05-21 VITALS — BP 151/82 | HR 70 | Temp 97.8°F | Resp 16 | Ht 72.0 in | Wt 192.0 lb

## 2015-05-21 DIAGNOSIS — Z0000 Encounter for general adult medical examination without abnormal findings: Secondary | ICD-10-CM | POA: Diagnosis not present

## 2015-05-21 DIAGNOSIS — I10 Essential (primary) hypertension: Secondary | ICD-10-CM

## 2015-05-21 DIAGNOSIS — G894 Chronic pain syndrome: Secondary | ICD-10-CM

## 2015-05-21 DIAGNOSIS — M545 Low back pain: Secondary | ICD-10-CM

## 2015-05-21 DIAGNOSIS — Z7689 Persons encountering health services in other specified circumstances: Secondary | ICD-10-CM

## 2015-05-21 DIAGNOSIS — Z7189 Other specified counseling: Secondary | ICD-10-CM | POA: Diagnosis not present

## 2015-05-21 DIAGNOSIS — M2010 Hallux valgus (acquired), unspecified foot: Secondary | ICD-10-CM

## 2015-05-21 DIAGNOSIS — I2510 Atherosclerotic heart disease of native coronary artery without angina pectoris: Secondary | ICD-10-CM | POA: Diagnosis not present

## 2015-05-21 LAB — COMPLETE METABOLIC PANEL WITH GFR
ALT: 15 U/L (ref 9–46)
AST: 17 U/L (ref 10–35)
Albumin: 3.9 g/dL (ref 3.6–5.1)
Alkaline Phosphatase: 61 U/L (ref 40–115)
BUN: 9 mg/dL (ref 7–25)
CO2: 21 mmol/L (ref 20–31)
Calcium: 9.1 mg/dL (ref 8.6–10.3)
Chloride: 105 mmol/L (ref 98–110)
Creat: 0.85 mg/dL (ref 0.70–1.25)
GFR, Est African American: >89 mL/min (ref >=60)
GFR, Est Non African American: >89 mL/min (ref >=60)
GLUCOSE: 190 mg/dL — AB (ref 65–99)
POTASSIUM: 3.8 mmol/L (ref 3.5–5.3)
SODIUM: 137 mmol/L (ref 135–146)
TOTAL PROTEIN: 7 g/dL (ref 6.1–8.1)
Total Bilirubin: 0.4 mg/dL (ref 0.2–1.2)

## 2015-05-21 LAB — CBC WITH DIFFERENTIAL/PLATELET
Basophils Absolute: 0 10*3/uL (ref 0.0–0.1)
Basophils Relative: 0 % (ref 0–1)
Eosinophils Absolute: 0 10*3/uL (ref 0.0–0.7)
Eosinophils Relative: 0 % (ref 0–5)
HCT: 41.4 % (ref 39.0–52.0)
HEMOGLOBIN: 14.5 g/dL (ref 13.0–17.0)
LYMPHS ABS: 0.9 10*3/uL (ref 0.7–4.0)
Lymphocytes Relative: 7 % — ABNORMAL LOW (ref 12–46)
MCH: 32.1 pg (ref 26.0–34.0)
MCHC: 35 g/dL (ref 30.0–36.0)
MCV: 91.6 fL (ref 78.0–100.0)
MONOS PCT: 2 % — AB (ref 3–12)
MPV: 8.8 fL (ref 8.6–12.4)
Monocytes Absolute: 0.3 10*3/uL (ref 0.1–1.0)
NEUTROS ABS: 12.2 10*3/uL — AB (ref 1.7–7.7)
Neutrophils Relative %: 91 % — ABNORMAL HIGH (ref 43–77)
Platelets: 267 10*3/uL (ref 150–400)
RBC: 4.52 MIL/uL (ref 4.22–5.81)
RDW: 13.7 % (ref 11.5–15.5)
WBC: 13.4 10*3/uL — ABNORMAL HIGH (ref 4.0–10.5)

## 2015-05-21 LAB — LIPID PANEL
CHOL/HDL RATIO: 3.8 ratio (ref <=5.0)
Cholesterol: 190 mg/dL (ref 125–200)
HDL: 50 mg/dL (ref >=40)
LDL CALC: 124 mg/dL (ref <130)
Triglycerides: 82 mg/dL (ref <150)
VLDL: 16 mg/dL (ref <30)

## 2015-05-21 LAB — TSH: TSH: 0.312 u[IU]/mL — AB (ref 0.350–4.500)

## 2015-05-21 MED ORDER — HYDROCHLOROTHIAZIDE 12.5 MG PO CAPS: 12.5000 mg | ORAL_CAPSULE | Freq: Every day | ORAL | Status: DC

## 2015-05-21 MED ORDER — LISINOPRIL 10 MG PO TABS: 10.0000 mg | ORAL_TABLET | Freq: Every day | ORAL | Status: DC

## 2015-05-21 NOTE — Patient Instructions (Signed)
Get back on lisinopril and hctz and return in 2 weeks for BP check with nurse. Follow-up in 3 months with me Follow a low salt. Keep moving as much as pain allows. We are setting up referrals to ortho (Dr. Maxie Better), pain clinic, podiatry and pain management We will review health maintenance needs once we have received records from previous provider.
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
## 2015-05-31 ENCOUNTER — Ambulatory Visit (HOSPITAL_COMMUNITY): Payer: Medicaid - Dental | Admitting: Dentistry

## 2015-05-31 ENCOUNTER — Encounter (HOSPITAL_COMMUNITY): Payer: Self-pay | Admitting: Dentistry

## 2015-05-31 ENCOUNTER — Telehealth: Payer: Self-pay

## 2015-05-31 VITALS — BP 140/75 | HR 51 | Temp 98.2°F

## 2015-05-31 DIAGNOSIS — K08109 Complete loss of teeth, unspecified cause, unspecified class: Secondary | ICD-10-CM

## 2015-05-31 DIAGNOSIS — Z972 Presence of dental prosthetic device (complete) (partial): Secondary | ICD-10-CM

## 2015-05-31 DIAGNOSIS — K000 Anodontia: Secondary | ICD-10-CM

## 2015-05-31 DIAGNOSIS — Z463 Encounter for fitting and adjustment of dental prosthetic device: Secondary | ICD-10-CM

## 2015-05-31 NOTE — Patient Instructions (Addendum)
Keep dentures out if sore spots arise. Use salt water rinses as needed to aid healing. Return to clinic as scheduled or call if problems arise before then. Patient to call if he wishes to proceed with upper lower complete denture relines. Patient will need prior approval from Medicaid before that. Dr. Enrique Sack
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
## 2015-05-31 NOTE — Telephone Encounter (Signed)
Patient came to office to inquire about referrals to [facility](Dr. Bean) and to the pain management clinic. He has been contacted by [employer].

## 2015-05-31 NOTE — Telephone Encounter (Signed)
Spoke with [employer] patient is scheduled for 06/17/2015.   Called Pain management ([facility]) was not able to speak with referral coordinator. Re-Faxed referral to their office and asked if they call/fax [place] with an update.   05/31/2015 @2 :00pm. Called and made patient aware of appointment and of status. Thanks!

## 2015-06-04 ENCOUNTER — Other Ambulatory Visit: Payer: Self-pay

## 2015-06-04 DIAGNOSIS — M4806 Spinal stenosis, lumbar region: Secondary | ICD-10-CM | POA: Diagnosis not present

## 2015-06-04 DIAGNOSIS — M5136 Other intervertebral disc degeneration, lumbar region: Secondary | ICD-10-CM | POA: Diagnosis not present

## 2015-06-04 DIAGNOSIS — M545 Low back pain: Secondary | ICD-10-CM | POA: Diagnosis not present

## 2015-06-15 DIAGNOSIS — M47817 Spondylosis without myelopathy or radiculopathy, lumbosacral region: Secondary | ICD-10-CM | POA: Diagnosis not present

## 2015-06-17 DIAGNOSIS — M25512 Pain in left shoulder: Secondary | ICD-10-CM | POA: Diagnosis not present

## 2015-06-17 DIAGNOSIS — M7502 Adhesive capsulitis of left shoulder: Secondary | ICD-10-CM | POA: Diagnosis not present

## 2015-06-17 DIAGNOSIS — M25511 Pain in right shoulder: Secondary | ICD-10-CM | POA: Diagnosis not present

## 2015-06-22 DIAGNOSIS — M5136 Other intervertebral disc degeneration, lumbar region: Secondary | ICD-10-CM | POA: Diagnosis not present

## 2015-06-22 DIAGNOSIS — G894 Chronic pain syndrome: Secondary | ICD-10-CM | POA: Diagnosis not present

## 2015-06-29 DIAGNOSIS — M545 Low back pain: Secondary | ICD-10-CM | POA: Diagnosis not present

## 2015-06-29 DIAGNOSIS — M47816 Spondylosis without myelopathy or radiculopathy, lumbar region: Secondary | ICD-10-CM | POA: Diagnosis not present

## 2015-07-19 DIAGNOSIS — M25511 Pain in right shoulder: Secondary | ICD-10-CM | POA: Diagnosis not present

## 2015-07-22 DIAGNOSIS — M549 Dorsalgia, unspecified: Secondary | ICD-10-CM | POA: Diagnosis not present

## 2015-07-22 DIAGNOSIS — M25512 Pain in left shoulder: Secondary | ICD-10-CM | POA: Diagnosis not present

## 2015-07-22 DIAGNOSIS — M5136 Other intervertebral disc degeneration, lumbar region: Secondary | ICD-10-CM | POA: Diagnosis not present

## 2015-07-22 DIAGNOSIS — Z79899 Other long term (current) drug therapy: Secondary | ICD-10-CM | POA: Diagnosis not present

## 2015-07-22 DIAGNOSIS — F060 Psychotic disorder with hallucinations due to known physiological condition: Secondary | ICD-10-CM | POA: Diagnosis not present

## 2015-07-22 DIAGNOSIS — Z1389 Encounter for screening for other disorder: Secondary | ICD-10-CM | POA: Diagnosis not present

## 2015-08-16 DIAGNOSIS — M25511 Pain in right shoulder: Secondary | ICD-10-CM | POA: Diagnosis not present

## 2015-08-23 ENCOUNTER — Ambulatory Visit: Payer: Self-pay | Admitting: Family Medicine

## 2015-08-26 DIAGNOSIS — M25511 Pain in right shoulder: Secondary | ICD-10-CM | POA: Diagnosis not present

## 2015-09-02 ENCOUNTER — Encounter: Payer: Self-pay | Admitting: Physical Medicine & Rehabilitation

## 2015-09-09 ENCOUNTER — Encounter: Payer: Medicare Other | Attending: Physical Medicine & Rehabilitation | Admitting: Physical Medicine & Rehabilitation

## 2015-09-09 ENCOUNTER — Encounter: Payer: Self-pay | Admitting: Physical Medicine & Rehabilitation

## 2015-09-09 VITALS — BP 160/80 | HR 57 | Ht 72.0 in | Wt 190.0 lb

## 2015-09-09 DIAGNOSIS — F419 Anxiety disorder, unspecified: Secondary | ICD-10-CM | POA: Insufficient documentation

## 2015-09-09 DIAGNOSIS — M549 Dorsalgia, unspecified: Secondary | ICD-10-CM | POA: Insufficient documentation

## 2015-09-09 DIAGNOSIS — K219 Gastro-esophageal reflux disease without esophagitis: Secondary | ICD-10-CM | POA: Diagnosis not present

## 2015-09-09 DIAGNOSIS — Z87891 Personal history of nicotine dependence: Secondary | ICD-10-CM | POA: Diagnosis not present

## 2015-09-09 DIAGNOSIS — Z951 Presence of aortocoronary bypass graft: Secondary | ICD-10-CM | POA: Diagnosis not present

## 2015-09-09 DIAGNOSIS — Z8551 Personal history of malignant neoplasm of bladder: Secondary | ICD-10-CM | POA: Insufficient documentation

## 2015-09-09 DIAGNOSIS — E785 Hyperlipidemia, unspecified: Secondary | ICD-10-CM | POA: Diagnosis not present

## 2015-09-09 DIAGNOSIS — I252 Old myocardial infarction: Secondary | ICD-10-CM | POA: Diagnosis not present

## 2015-09-09 DIAGNOSIS — M542 Cervicalgia: Secondary | ICD-10-CM | POA: Diagnosis not present

## 2015-09-09 DIAGNOSIS — G894 Chronic pain syndrome: Secondary | ICD-10-CM | POA: Insufficient documentation

## 2015-09-09 DIAGNOSIS — Z8249 Family history of ischemic heart disease and other diseases of the circulatory system: Secondary | ICD-10-CM | POA: Insufficient documentation

## 2015-09-09 DIAGNOSIS — I10 Essential (primary) hypertension: Secondary | ICD-10-CM | POA: Diagnosis not present

## 2015-09-09 DIAGNOSIS — Z79899 Other long term (current) drug therapy: Secondary | ICD-10-CM | POA: Diagnosis not present

## 2015-09-09 DIAGNOSIS — G4730 Sleep apnea, unspecified: Secondary | ICD-10-CM | POA: Insufficient documentation

## 2015-09-09 DIAGNOSIS — J449 Chronic obstructive pulmonary disease, unspecified: Secondary | ICD-10-CM | POA: Diagnosis not present

## 2015-09-09 DIAGNOSIS — I2510 Atherosclerotic heart disease of native coronary artery without angina pectoris: Secondary | ICD-10-CM | POA: Insufficient documentation
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
## 2015-09-24 DIAGNOSIS — M5136 Other intervertebral disc degeneration, lumbar region: Secondary | ICD-10-CM | POA: Diagnosis not present

## 2015-09-24 DIAGNOSIS — M25512 Pain in left shoulder: Secondary | ICD-10-CM | POA: Diagnosis not present

## 2015-09-24 DIAGNOSIS — I5189 Other ill-defined heart diseases: Secondary | ICD-10-CM | POA: Diagnosis not present

## 2015-09-24 DIAGNOSIS — M549 Dorsalgia, unspecified: Secondary | ICD-10-CM | POA: Diagnosis not present

## 2015-09-29 DIAGNOSIS — M7502 Adhesive capsulitis of left shoulder: Secondary | ICD-10-CM | POA: Diagnosis not present

## 2015-09-29 DIAGNOSIS — M25512 Pain in left shoulder: Secondary | ICD-10-CM | POA: Diagnosis not present

## 2015-09-29 IMAGING — CR DG CHEST 2V
2 series · 2 of 2 positions shown · non-contrast
Comparison: 06/19/2004.

CLINICAL DATA: Chest pain and shortness of breath.

EXAM:
CHEST  2 VIEW

[w chest pa]
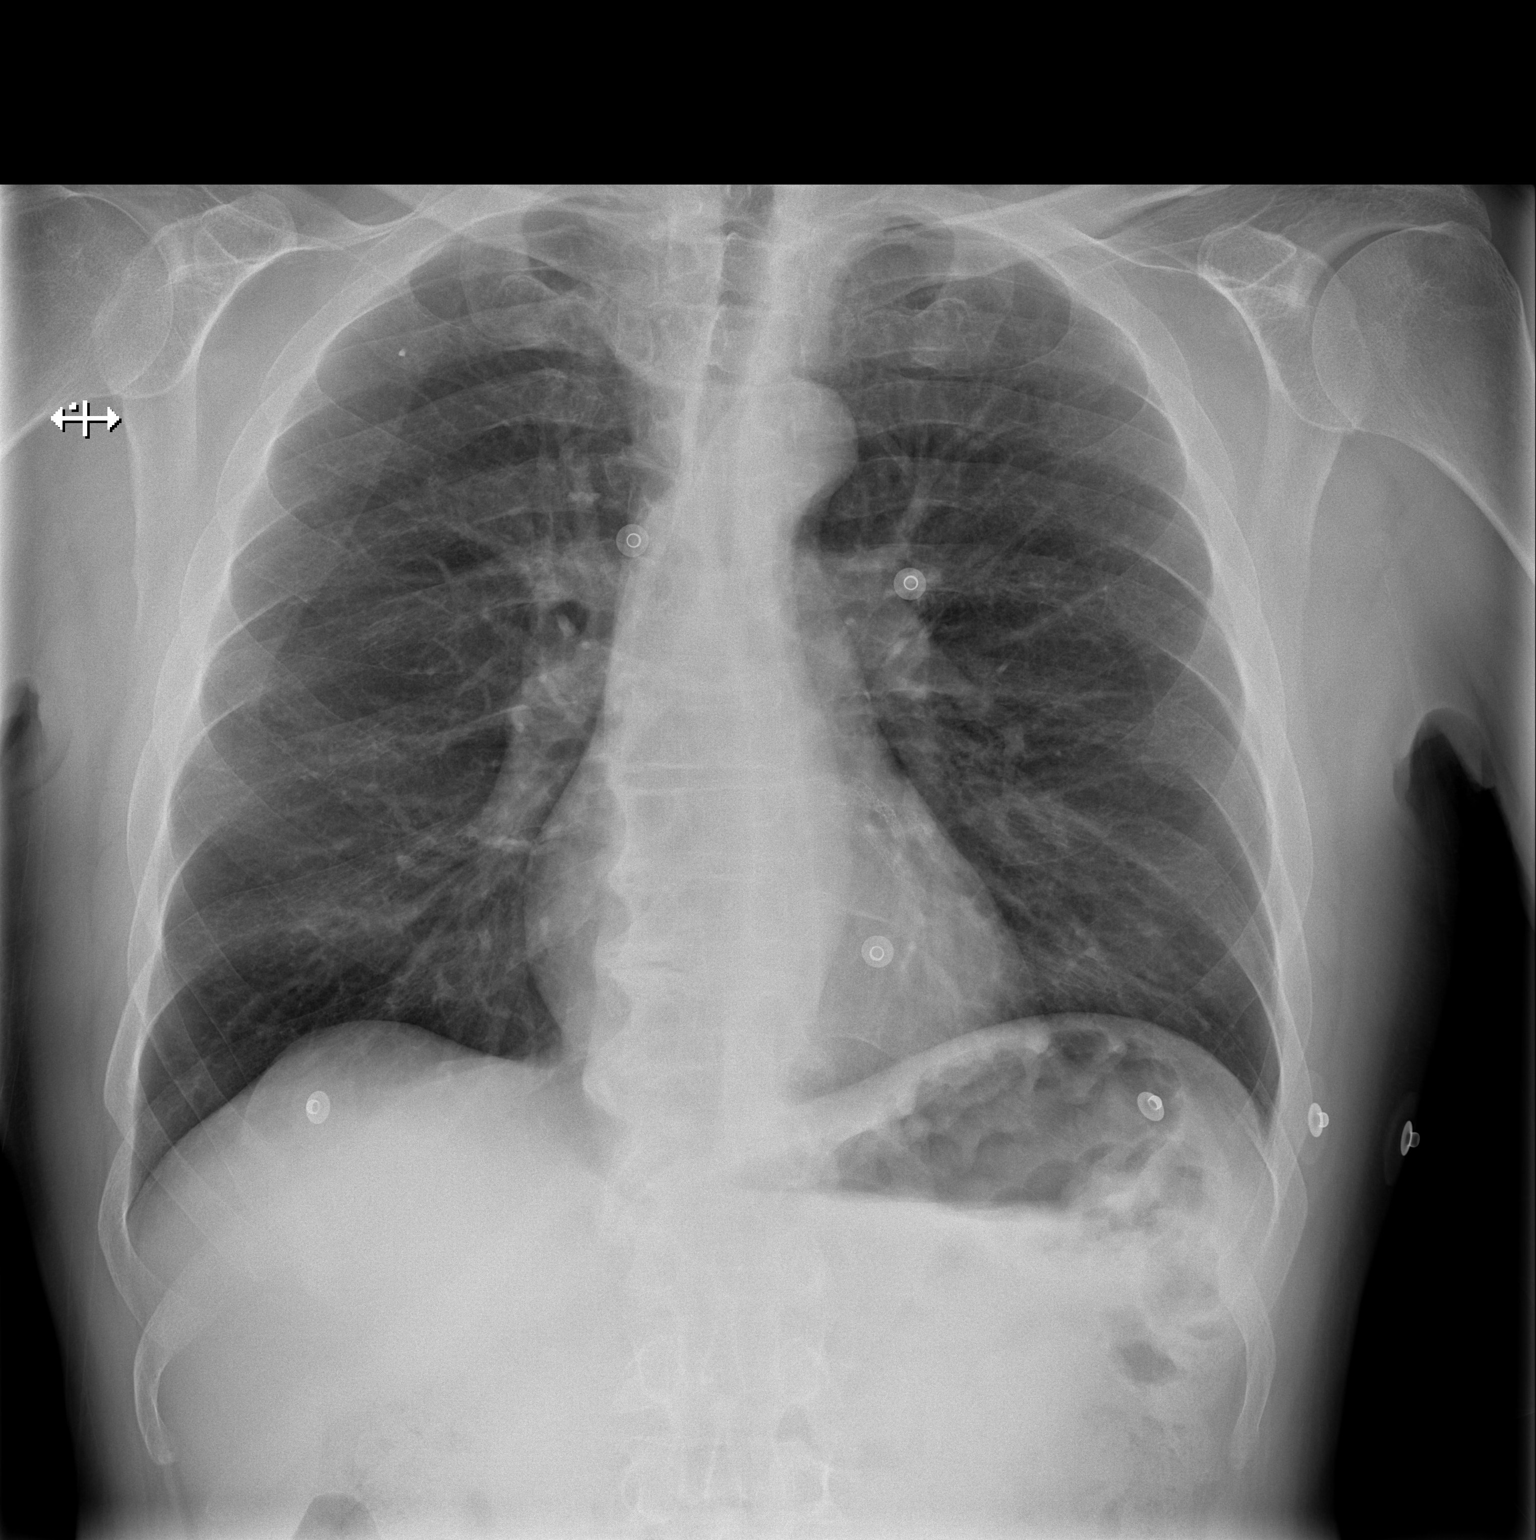

[w chest lat]
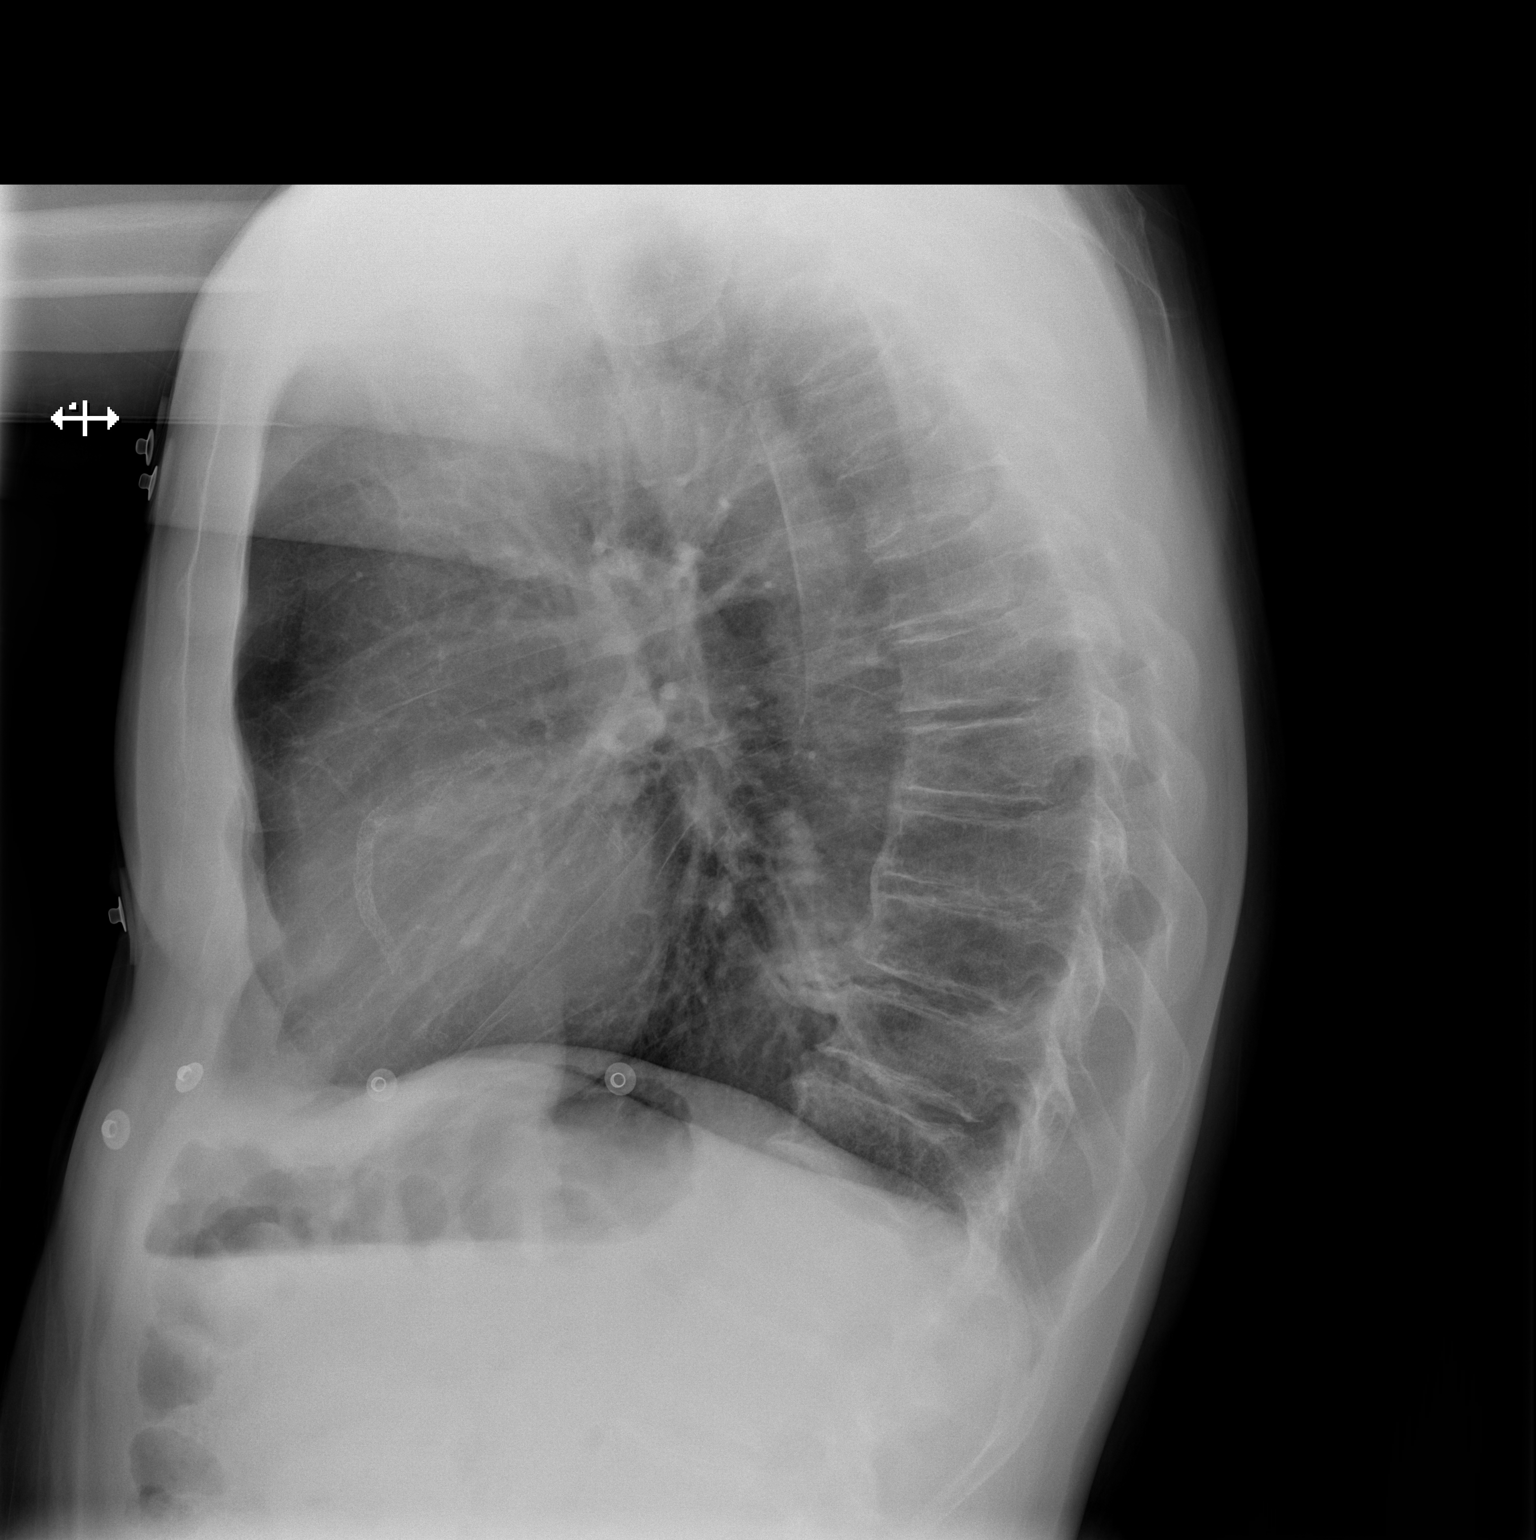

[2 of 2 positions shown; findings below may reference images not displayed]

FINDINGS: Hyperinflation consistent with emphysema. Peribronchial thickening
consistent with chronic bronchitis. Coronary stents. Normal heart
size and pulmonary vascularity. No focal consolidation or airspace
disease in the lungs. No blunting of costophrenic angles. No
pneumothorax. Degenerative changes in the spine.
IMPRESSION: Emphysematous and chronic bronchitic changes in the lungs. No
evidence of active pulmonary disease.

## 2015-10-01 ENCOUNTER — Telehealth: Payer: Self-pay | Admitting: *Deleted

## 2015-10-01 NOTE — Telephone Encounter (Signed)
Spoke with pt, letter reviewed by dr Stanford Breed and pt was referred to the surgeons office.

## 2015-10-03 IMAGING — DX DG CHEST 1V PORT
1 series · 1 of 1 positions shown · non-contrast
Comparison: 08/11/2013.

CLINICAL DATA: Prior CABG.

EXAM:
PORTABLE CHEST - 1 VIEW

[portable]
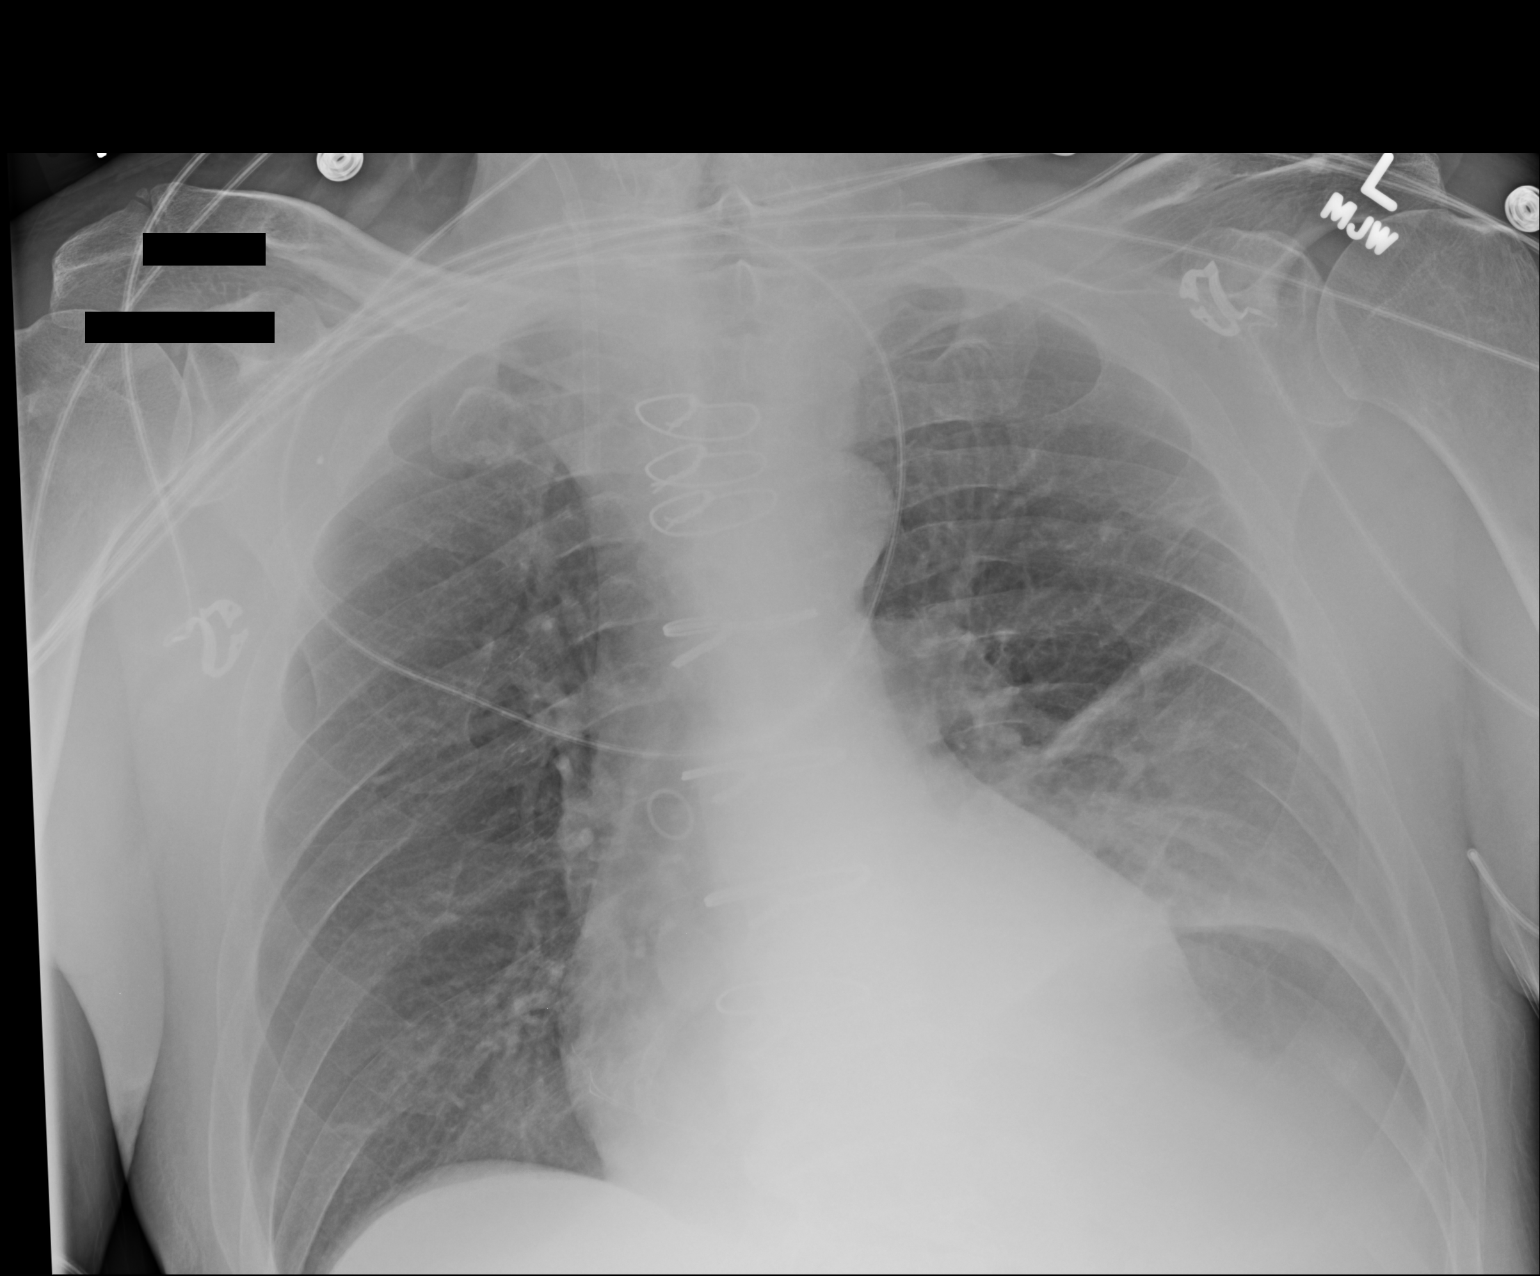

[1 of 1 positions shown; findings below may reference images not displayed]

FINDINGS: Interim removal of left chest tube and mediastinal drainage
catheter. Interim removal Swan-Ganz catheter. Right IJ sheath noted.
Left basilar atelectatic changes noted. Mild left base infiltrate
and/or pleural effusion cannot be excluded. There is no
pneumothorax. Right lung is clear. Cardiomegaly. Prior CABG.
Pulmonary vascularity is normal.
IMPRESSION: 1. Interim removal of left chest tube, mediastinal drainage
catheter, and Swan-Ganz catheter.
2. Left base progressive atelectatic changes. Small left base
infiltrate and/or effusion cannot be excluded. No pneumothorax.

## 2015-10-04 IMAGING — CR DG CHEST 2V
2 series · 2 of 2 positions shown · non-contrast
Comparison: 08/12/2013.

CLINICAL DATA: CABG.  Atelectasis .

EXAM:
CHEST  2 VIEW

[w chest pa]
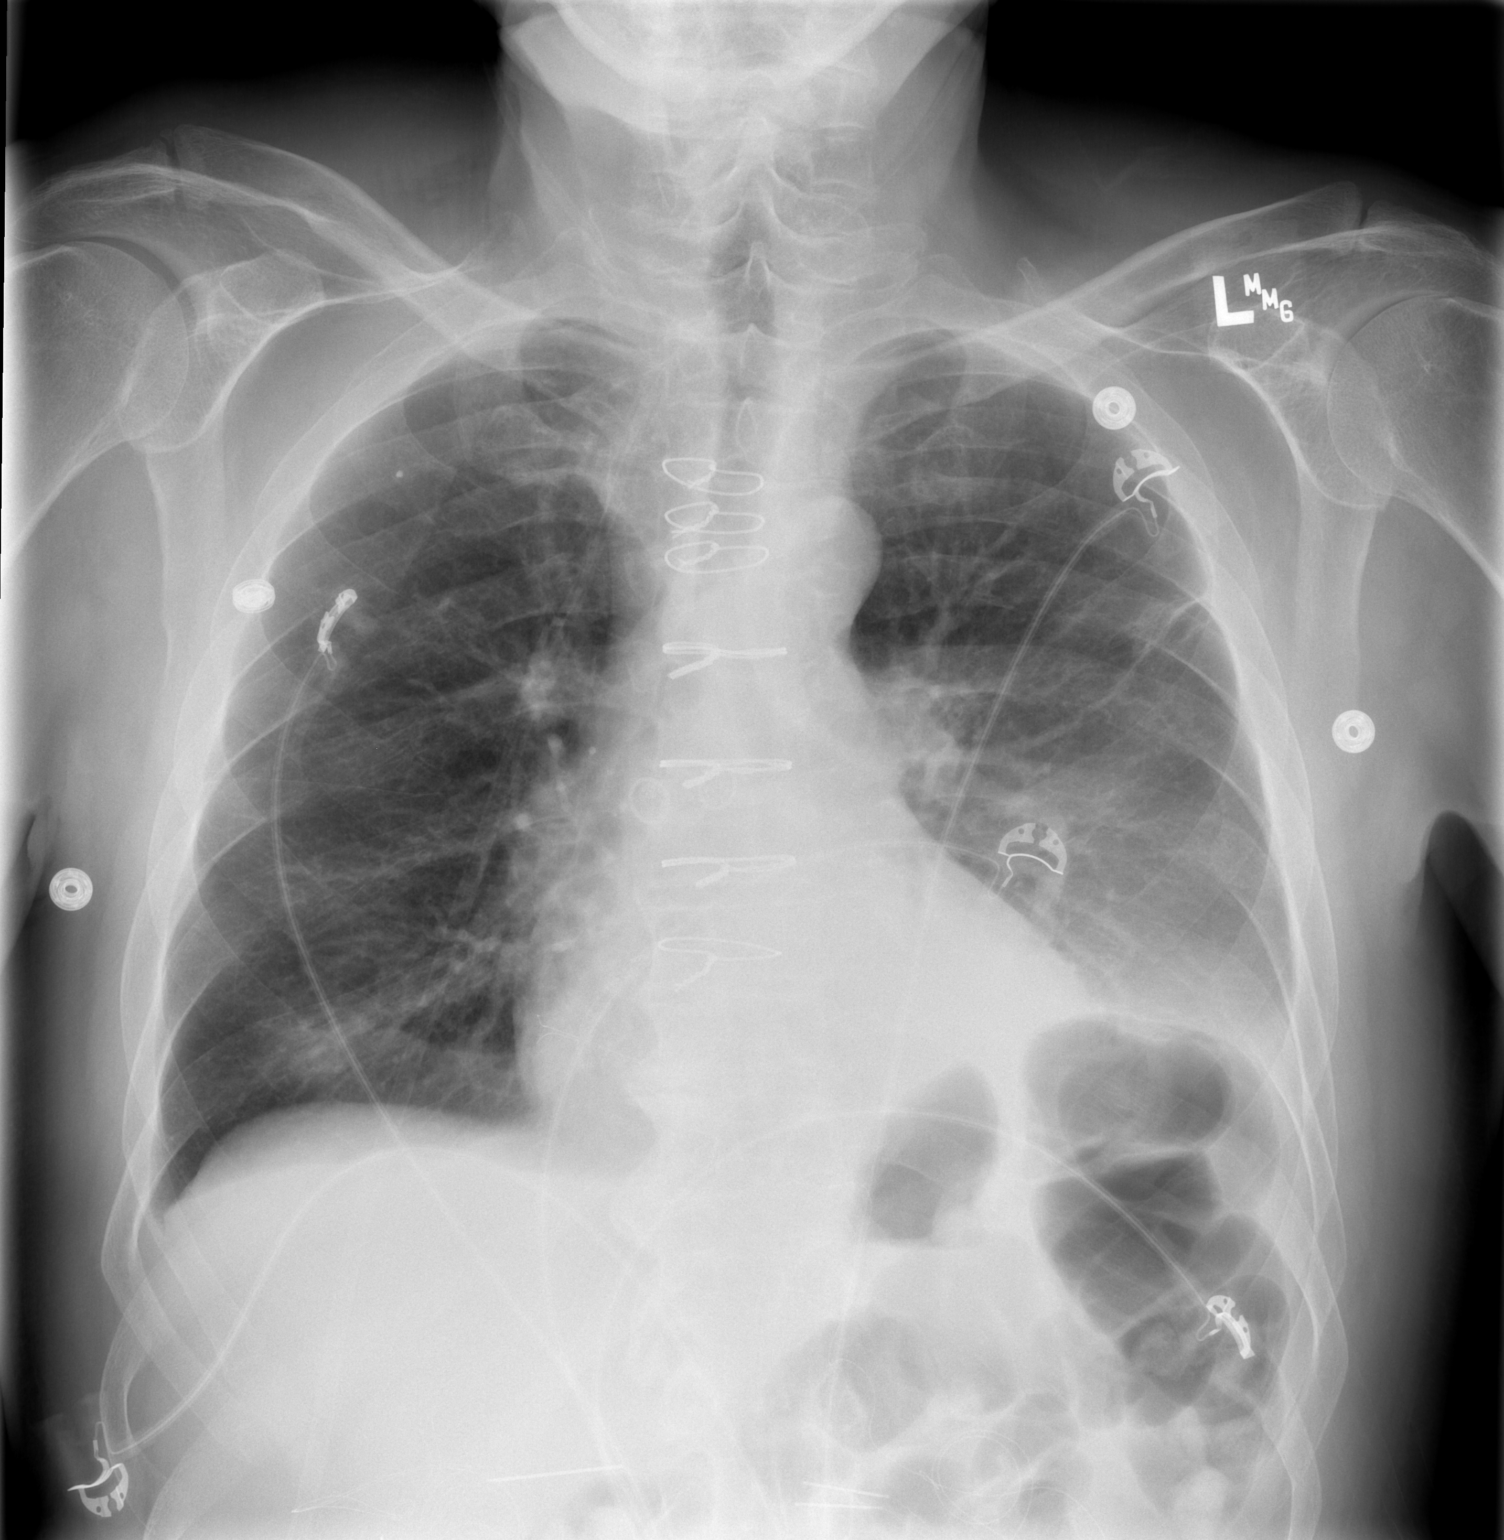

[w chest lat]
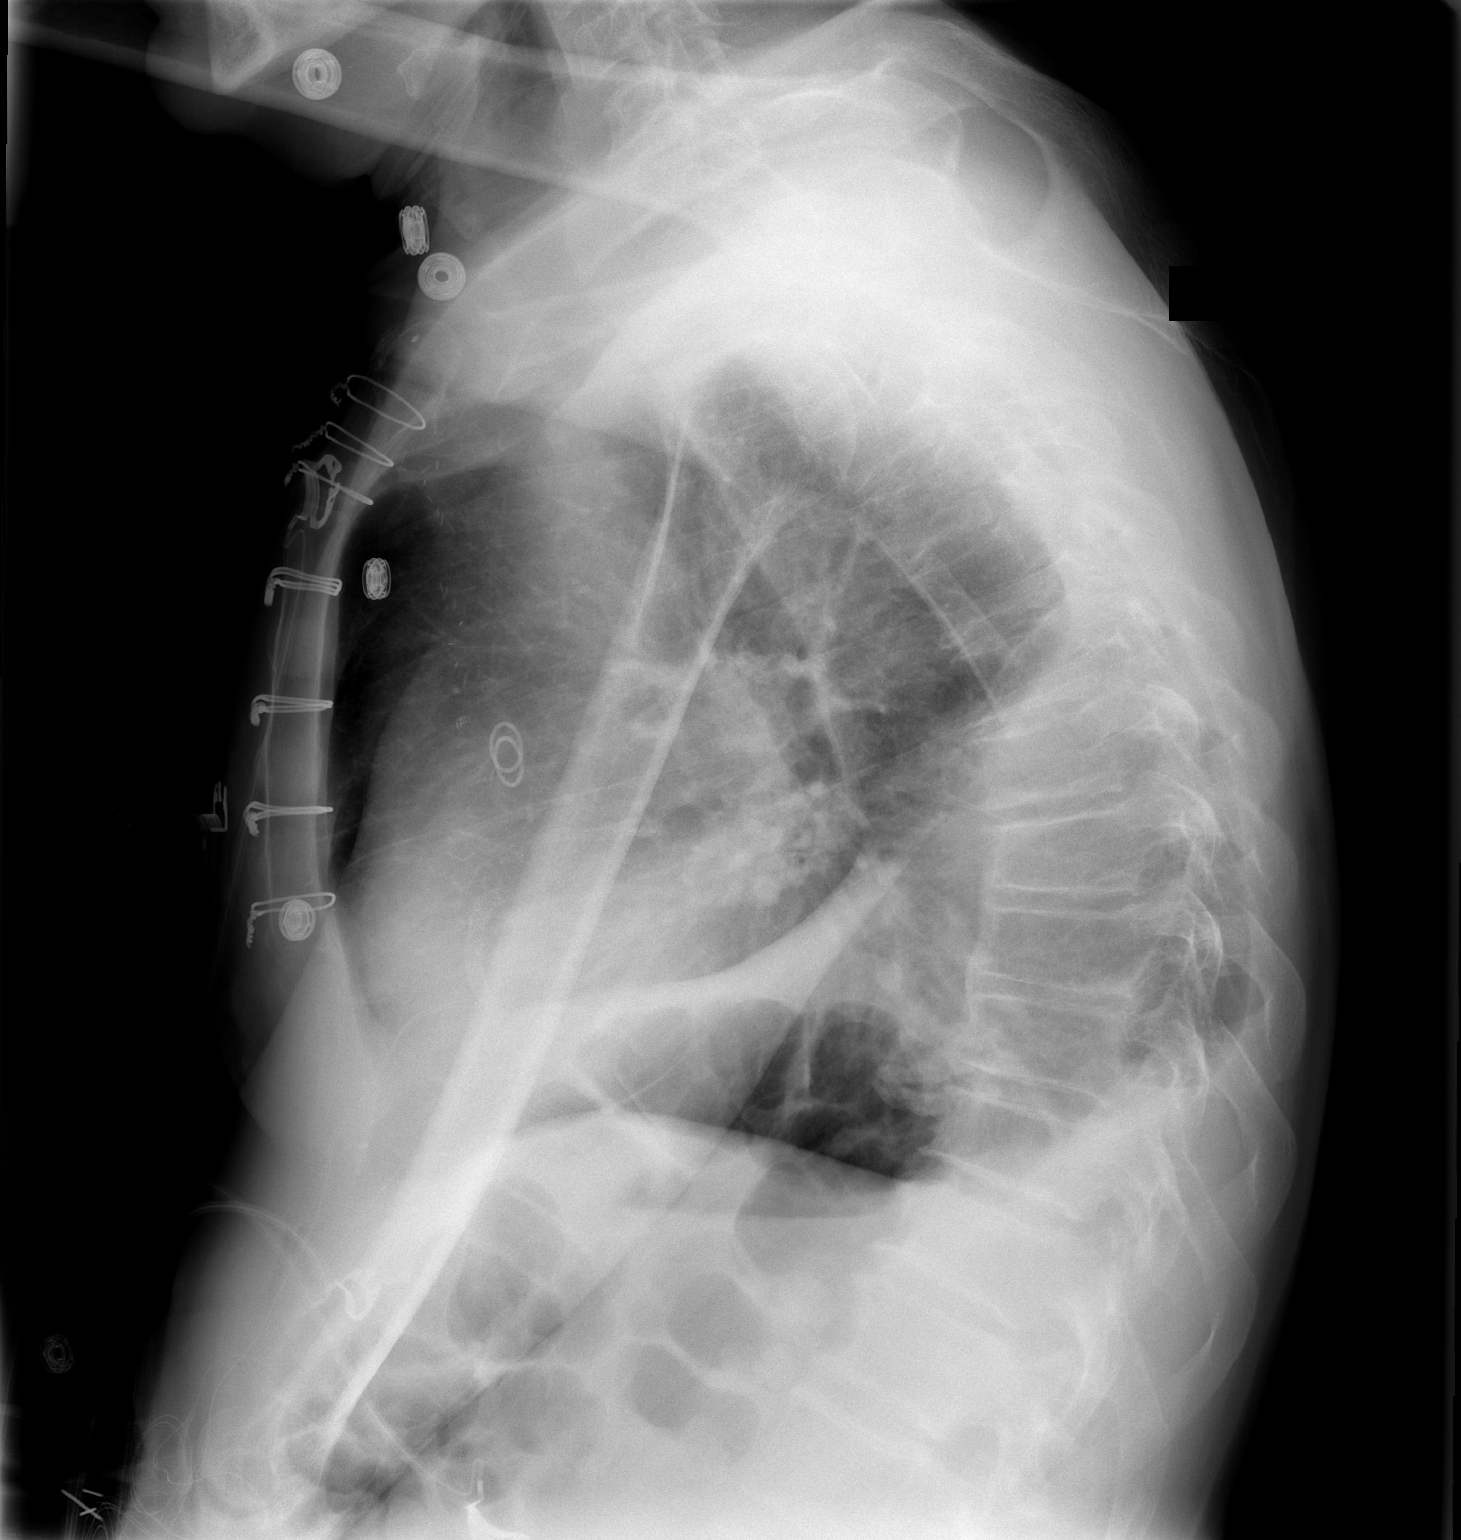

[2 of 2 positions shown; findings below may reference images not displayed]

FINDINGS: Interim removal of right IJ line. Prior CABG. Heart size normal.
Pulmonary vascularity normal. Persistent atelectasis/ infiltrate
left lung base. This is improved slightly from prior exam. Small
left pleural effusion cannot be excluded. Mild subsegmental
atelectasis right lung base. No pneumothorax.
IMPRESSION: 1. Interim removal of right IJ sheath.
2. Slight improvement of left lung base atelectasis and/or
infiltrate.

## 2015-10-07 DIAGNOSIS — M7502 Adhesive capsulitis of left shoulder: Secondary | ICD-10-CM | POA: Diagnosis not present

## 2015-10-14 DIAGNOSIS — M25511 Pain in right shoulder: Secondary | ICD-10-CM | POA: Diagnosis not present

## 2015-10-14 DIAGNOSIS — M7541 Impingement syndrome of right shoulder: Secondary | ICD-10-CM | POA: Diagnosis not present

## 2015-11-09 DIAGNOSIS — N401 Enlarged prostate with lower urinary tract symptoms: Secondary | ICD-10-CM | POA: Diagnosis not present

## 2015-11-09 DIAGNOSIS — C672 Malignant neoplasm of lateral wall of bladder: Secondary | ICD-10-CM | POA: Diagnosis not present

## 2015-11-11 DIAGNOSIS — M19011 Primary osteoarthritis, right shoulder: Secondary | ICD-10-CM | POA: Diagnosis not present

## 2015-11-11 DIAGNOSIS — M25511 Pain in right shoulder: Secondary | ICD-10-CM | POA: Diagnosis not present

## 2015-11-17 DIAGNOSIS — M47816 Spondylosis without myelopathy or radiculopathy, lumbar region: Secondary | ICD-10-CM | POA: Diagnosis not present

## 2015-11-17 DIAGNOSIS — G8929 Other chronic pain: Secondary | ICD-10-CM | POA: Diagnosis not present

## 2015-11-17 DIAGNOSIS — G894 Chronic pain syndrome: Secondary | ICD-10-CM | POA: Diagnosis not present

## 2015-11-17 DIAGNOSIS — M545 Low back pain: Secondary | ICD-10-CM | POA: Diagnosis not present

## 2015-11-25 DIAGNOSIS — C678 Malignant neoplasm of overlapping sites of bladder: Secondary | ICD-10-CM | POA: Diagnosis not present

## 2015-11-25 DIAGNOSIS — Z79899 Other long term (current) drug therapy: Secondary | ICD-10-CM | POA: Diagnosis not present

## 2015-11-25 DIAGNOSIS — I10 Essential (primary) hypertension: Secondary | ICD-10-CM | POA: Diagnosis not present

## 2015-11-25 DIAGNOSIS — G8929 Other chronic pain: Secondary | ICD-10-CM | POA: Diagnosis not present

## 2015-11-25 DIAGNOSIS — I2510 Atherosclerotic heart disease of native coronary artery without angina pectoris: Secondary | ICD-10-CM | POA: Diagnosis not present

## 2015-11-25 DIAGNOSIS — D494 Neoplasm of unspecified behavior of bladder: Secondary | ICD-10-CM | POA: Diagnosis not present

## 2015-11-25 DIAGNOSIS — R310 Gross hematuria: Secondary | ICD-10-CM | POA: Diagnosis not present

## 2015-11-25 DIAGNOSIS — M549 Dorsalgia, unspecified: Secondary | ICD-10-CM | POA: Diagnosis not present

## 2015-11-25 DIAGNOSIS — C672 Malignant neoplasm of lateral wall of bladder: Secondary | ICD-10-CM | POA: Diagnosis not present

## 2015-11-25 DIAGNOSIS — N401 Enlarged prostate with lower urinary tract symptoms: Secondary | ICD-10-CM | POA: Diagnosis not present

## 2015-11-25 DIAGNOSIS — F17210 Nicotine dependence, cigarettes, uncomplicated: Secondary | ICD-10-CM | POA: Diagnosis not present

## 2015-11-25 DIAGNOSIS — Z7982 Long term (current) use of aspirin: Secondary | ICD-10-CM | POA: Diagnosis not present

## 2015-11-25 DIAGNOSIS — J449 Chronic obstructive pulmonary disease, unspecified: Secondary | ICD-10-CM | POA: Diagnosis not present

## 2015-12-03 DIAGNOSIS — N401 Enlarged prostate with lower urinary tract symptoms: Secondary | ICD-10-CM | POA: Diagnosis not present

## 2015-12-03 DIAGNOSIS — C672 Malignant neoplasm of lateral wall of bladder: Secondary | ICD-10-CM | POA: Diagnosis not present

## 2015-12-23 DIAGNOSIS — I5189 Other ill-defined heart diseases: Secondary | ICD-10-CM | POA: Diagnosis not present

## 2015-12-23 DIAGNOSIS — M5136 Other intervertebral disc degeneration, lumbar region: Secondary | ICD-10-CM | POA: Diagnosis not present

## 2015-12-23 DIAGNOSIS — E785 Hyperlipidemia, unspecified: Secondary | ICD-10-CM | POA: Diagnosis not present

## 2015-12-23 DIAGNOSIS — M25512 Pain in left shoulder: Secondary | ICD-10-CM | POA: Diagnosis not present

## 2015-12-23 DIAGNOSIS — M549 Dorsalgia, unspecified: Secondary | ICD-10-CM | POA: Diagnosis not present

## 2016-01-12 DIAGNOSIS — G8929 Other chronic pain: Secondary | ICD-10-CM | POA: Diagnosis not present

## 2016-01-12 DIAGNOSIS — M47816 Spondylosis without myelopathy or radiculopathy, lumbar region: Secondary | ICD-10-CM | POA: Diagnosis not present

## 2016-01-12 DIAGNOSIS — M545 Low back pain: Secondary | ICD-10-CM | POA: Diagnosis not present

## 2016-01-12 DIAGNOSIS — M25511 Pain in right shoulder: Secondary | ICD-10-CM | POA: Diagnosis not present

## 2016-01-23 ENCOUNTER — Encounter (HOSPITAL_COMMUNITY): Payer: Self-pay | Admitting: *Deleted

## 2016-01-23 ENCOUNTER — Emergency Department (HOSPITAL_COMMUNITY): Payer: Medicare Other

## 2016-01-23 ENCOUNTER — Emergency Department (HOSPITAL_COMMUNITY)
Admission: EM | Admit: 2016-01-23 | Discharge: 2016-01-23 | Disposition: A | Discharge status: Home / Self Care | Payer: Medicare Other | Attending: Emergency Medicine | Admitting: Emergency Medicine

## 2016-01-23 DIAGNOSIS — Z862 Personal history of diseases of the blood and blood-forming organs and certain disorders involving the immune mechanism: Secondary | ICD-10-CM | POA: Insufficient documentation

## 2016-01-23 DIAGNOSIS — I2510 Atherosclerotic heart disease of native coronary artery without angina pectoris: Secondary | ICD-10-CM | POA: Diagnosis not present

## 2016-01-23 DIAGNOSIS — Z79899 Other long term (current) drug therapy: Secondary | ICD-10-CM | POA: Diagnosis not present

## 2016-01-23 DIAGNOSIS — Z9889 Other specified postprocedural states: Secondary | ICD-10-CM | POA: Insufficient documentation

## 2016-01-23 DIAGNOSIS — R339 Retention of urine, unspecified: Secondary | ICD-10-CM | POA: Insufficient documentation

## 2016-01-23 DIAGNOSIS — Z8551 Personal history of malignant neoplasm of bladder: Secondary | ICD-10-CM | POA: Diagnosis not present

## 2016-01-23 DIAGNOSIS — I252 Old myocardial infarction: Secondary | ICD-10-CM | POA: Insufficient documentation

## 2016-01-23 DIAGNOSIS — R39198 Other difficulties with micturition: Secondary | ICD-10-CM | POA: Diagnosis present

## 2016-01-23 DIAGNOSIS — E785 Hyperlipidemia, unspecified: Secondary | ICD-10-CM | POA: Insufficient documentation

## 2016-01-23 DIAGNOSIS — G4730 Sleep apnea, unspecified: Secondary | ICD-10-CM | POA: Diagnosis not present

## 2016-01-23 DIAGNOSIS — Z87891 Personal history of nicotine dependence: Secondary | ICD-10-CM | POA: Insufficient documentation

## 2016-01-23 DIAGNOSIS — I10 Essential (primary) hypertension: Secondary | ICD-10-CM | POA: Insufficient documentation

## 2016-01-23 DIAGNOSIS — R109 Unspecified abdominal pain: Secondary | ICD-10-CM | POA: Insufficient documentation

## 2016-01-23 DIAGNOSIS — Z9861 Coronary angioplasty status: Secondary | ICD-10-CM | POA: Diagnosis not present

## 2016-01-23 DIAGNOSIS — M47892 Other spondylosis, cervical region: Secondary | ICD-10-CM | POA: Diagnosis not present

## 2016-01-23 DIAGNOSIS — J449 Chronic obstructive pulmonary disease, unspecified: Secondary | ICD-10-CM | POA: Diagnosis not present

## 2016-01-23 DIAGNOSIS — Z7951 Long term (current) use of inhaled steroids: Secondary | ICD-10-CM | POA: Diagnosis not present

## 2016-01-23 DIAGNOSIS — Z8659 Personal history of other mental and behavioral disorders: Secondary | ICD-10-CM | POA: Insufficient documentation

## 2016-01-23 DIAGNOSIS — R0602 Shortness of breath: Secondary | ICD-10-CM | POA: Diagnosis not present

## 2016-01-23 DIAGNOSIS — Z8719 Personal history of other diseases of the digestive system: Secondary | ICD-10-CM | POA: Diagnosis not present

## 2016-01-23 DIAGNOSIS — Z951 Presence of aortocoronary bypass graft: Secondary | ICD-10-CM | POA: Insufficient documentation

## 2016-01-23 LAB — BASIC METABOLIC PANEL
Anion gap: 8 (ref 5–15)
BUN: 7 mg/dL (ref 6–20)
CO2: 24 mmol/L (ref 22–32)
Calcium: 9.2 mg/dL (ref 8.9–10.3)
Chloride: 105 mmol/L (ref 101–111)
Creatinine, Ser: 1.01 mg/dL (ref 0.61–1.24)
GFR calc Af Amer: >60 mL/min (ref >60)
GFR calc non Af Amer: >60 mL/min (ref >60)
Glucose, Bld: 106 mg/dL — ABNORMAL HIGH (ref 65–99)
Potassium: 3.8 mmol/L (ref 3.5–5.1)
Sodium: 137 mmol/L (ref 135–145)

## 2016-01-23 LAB — CBC
HCT: 46.2 % (ref 39.0–52.0)
Hemoglobin: 15.4 g/dL (ref 13.0–17.0)
MCH: 31.6 pg (ref 26.0–34.0)
MCHC: 33.3 g/dL (ref 30.0–36.0)
MCV: 94.9 fL (ref 78.0–100.0)
Platelets: 242 10*3/uL (ref 150–400)
RBC: 4.87 MIL/uL (ref 4.22–5.81)
RDW: 13 % (ref 11.5–15.5)
WBC: 5.7 10*3/uL (ref 4.0–10.5)

## 2016-01-23 LAB — URINALYSIS, ROUTINE W REFLEX MICROSCOPIC
Bilirubin Urine: NEGATIVE
Glucose, UA: NEGATIVE mg/dL
Hgb urine dipstick: NEGATIVE
Ketones, ur: NEGATIVE mg/dL
Leukocytes, UA: NEGATIVE
Nitrite: NEGATIVE
Protein, ur: NEGATIVE mg/dL
Specific Gravity, Urine: 1.008 (ref 1.005–1.030)
pH: 5 (ref 5.0–8.0)

## 2016-01-23 LAB — I-STAT TROPONIN, ED: Troponin i, poc: 0 ng/mL (ref 0.00–0.08)

## 2016-01-23 MED ORDER — TAMSULOSIN HCL 0.4 MG PO CAPS: 0.4000 mg | ORAL_CAPSULE | Freq: Every day | ORAL | Status: DC

## 2016-01-23 NOTE — ED Notes (Signed)
Pt voided 75cc. Post-void bladder scan showed 200cc remaining in bladder.
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
## 2016-01-23 NOTE — ED Notes (Signed)
Pt states he has not urinated since Friday.  Hx of bladder surgery April to remove bladder ca.  Pt also c/o sob and chest pain, feeling as if he has fluid on his lungs (no hx of chf).  Pt is taking 10 mg opana for chronic pain.

## 2016-01-23 NOTE — Discharge Instructions (Signed)
Please read and follow all provided instructions.  Your diagnoses today include:  1. Urinary retention    Tests performed today include:  Vital signs. See below for your results today.   Medications prescribed:   Take as prescribed   Home care instructions:  Follow any educational materials contained in this packet.  Follow-up instructions: Please follow-up with Urology for further evaluation of symptoms and treatment. Call and make an appointment   Return instructions:   Please return to the Emergency Department if you do not get better, if you get worse, or new symptoms OR  - Fever (temperature greater than 101.64F)  - Bleeding that does not stop with holding pressure to the area    -Severe pain (please note that you may be more sore the day after your accident)  - Chest Pain  - Difficulty breathing  - Severe nausea or vomiting  - Inability to tolerate food and liquids  - Passing out  - Skin becoming red around your wounds  - Change in mental status (confusion or lethargy)  - New numbness or weakness     Please return if you have any other emergent concerns.  Additional Information:  Your vital signs today were: BP 135/81 mmHg   Pulse 54   Temp(Src) 98.4 F (36.9 C) (Oral)   Resp 18   Ht 6' (1.829 m)   SpO2 98% If your blood pressure (BP) was elevated above 135/85 this visit, please have this repeated by your doctor within one month. ---------------

## 2016-01-23 NOTE — ED Notes (Signed)
Pt verbalized understanding of d/c instructions, prescriptions, and follow-up care. No further questions/concerns, VSS, ambulatory w/ steady gait (refused wheelchair) 

## 2016-02-01 DIAGNOSIS — M47816 Spondylosis without myelopathy or radiculopathy, lumbar region: Secondary | ICD-10-CM | POA: Diagnosis not present

## 2016-02-22 DIAGNOSIS — M47816 Spondylosis without myelopathy or radiculopathy, lumbar region: Secondary | ICD-10-CM | POA: Diagnosis not present

## 2016-02-22 DIAGNOSIS — M4806 Spinal stenosis, lumbar region: Secondary | ICD-10-CM | POA: Diagnosis not present

## 2016-02-22 DIAGNOSIS — G894 Chronic pain syndrome: Secondary | ICD-10-CM | POA: Diagnosis not present

## 2016-02-22 DIAGNOSIS — M545 Low back pain: Secondary | ICD-10-CM | POA: Diagnosis not present

## 2016-03-06 DIAGNOSIS — C672 Malignant neoplasm of lateral wall of bladder: Secondary | ICD-10-CM | POA: Diagnosis not present

## 2016-03-06 DIAGNOSIS — N401 Enlarged prostate with lower urinary tract symptoms: Secondary | ICD-10-CM | POA: Diagnosis not present

## 2016-03-09 DIAGNOSIS — A932 Colorado tick fever: Secondary | ICD-10-CM | POA: Diagnosis not present

## 2016-03-23 DIAGNOSIS — M5136 Other intervertebral disc degeneration, lumbar region: Secondary | ICD-10-CM | POA: Diagnosis not present

## 2016-03-23 DIAGNOSIS — I10 Essential (primary) hypertension: Secondary | ICD-10-CM | POA: Diagnosis not present

## 2016-03-23 DIAGNOSIS — E559 Vitamin D deficiency, unspecified: Secondary | ICD-10-CM | POA: Diagnosis not present

## 2016-03-23 DIAGNOSIS — R5383 Other fatigue: Secondary | ICD-10-CM | POA: Diagnosis not present

## 2016-03-23 DIAGNOSIS — E785 Hyperlipidemia, unspecified: Secondary | ICD-10-CM | POA: Diagnosis not present

## 2016-03-23 DIAGNOSIS — M549 Dorsalgia, unspecified: Secondary | ICD-10-CM | POA: Diagnosis not present

## 2016-03-23 DIAGNOSIS — I5189 Other ill-defined heart diseases: Secondary | ICD-10-CM | POA: Diagnosis not present

## 2016-04-03 DIAGNOSIS — N401 Enlarged prostate with lower urinary tract symptoms: Secondary | ICD-10-CM | POA: Diagnosis not present

## 2016-04-03 DIAGNOSIS — C672 Malignant neoplasm of lateral wall of bladder: Secondary | ICD-10-CM | POA: Diagnosis not present

## 2016-04-03 DIAGNOSIS — Z125 Encounter for screening for malignant neoplasm of prostate: Secondary | ICD-10-CM | POA: Diagnosis not present

## 2016-04-24 DIAGNOSIS — M549 Dorsalgia, unspecified: Secondary | ICD-10-CM | POA: Diagnosis not present

## 2016-04-24 DIAGNOSIS — I10 Essential (primary) hypertension: Secondary | ICD-10-CM | POA: Diagnosis not present

## 2016-04-24 DIAGNOSIS — M5136 Other intervertebral disc degeneration, lumbar region: Secondary | ICD-10-CM | POA: Diagnosis not present

## 2016-04-24 DIAGNOSIS — I5189 Other ill-defined heart diseases: Secondary | ICD-10-CM | POA: Diagnosis not present

## 2016-05-16 DIAGNOSIS — M5136 Other intervertebral disc degeneration, lumbar region: Secondary | ICD-10-CM | POA: Diagnosis not present

## 2016-05-16 DIAGNOSIS — M4806 Spinal stenosis, lumbar region: Secondary | ICD-10-CM | POA: Diagnosis not present

## 2016-05-16 DIAGNOSIS — M5106 Intervertebral disc disorders with myelopathy, lumbar region: Secondary | ICD-10-CM | POA: Diagnosis not present

## 2016-05-17 DIAGNOSIS — M25512 Pain in left shoulder: Secondary | ICD-10-CM | POA: Diagnosis not present

## 2016-05-22 DIAGNOSIS — M5136 Other intervertebral disc degeneration, lumbar region: Secondary | ICD-10-CM | POA: Diagnosis not present

## 2016-05-22 DIAGNOSIS — E785 Hyperlipidemia, unspecified: Secondary | ICD-10-CM | POA: Diagnosis not present

## 2016-05-22 DIAGNOSIS — M549 Dorsalgia, unspecified: Secondary | ICD-10-CM | POA: Diagnosis not present

## 2016-05-22 DIAGNOSIS — I10 Essential (primary) hypertension: Secondary | ICD-10-CM | POA: Diagnosis not present

## 2016-05-24 DIAGNOSIS — S30861A Insect bite (nonvenomous) of abdominal wall, initial encounter: Secondary | ICD-10-CM | POA: Diagnosis not present

## 2016-05-25 DIAGNOSIS — M545 Low back pain: Secondary | ICD-10-CM | POA: Diagnosis not present

## 2016-05-30 ENCOUNTER — Encounter: Payer: Self-pay | Admitting: Cardiology

## 2016-06-01 DIAGNOSIS — M5106 Intervertebral disc disorders with myelopathy, lumbar region: Secondary | ICD-10-CM | POA: Diagnosis not present

## 2016-06-01 DIAGNOSIS — M5136 Other intervertebral disc degeneration, lumbar region: Secondary | ICD-10-CM | POA: Diagnosis not present

## 2016-06-01 DIAGNOSIS — M4806 Spinal stenosis, lumbar region: Secondary | ICD-10-CM | POA: Diagnosis not present

## 2016-06-01 DIAGNOSIS — M545 Low back pain: Secondary | ICD-10-CM | POA: Diagnosis not present

## 2016-06-05 ENCOUNTER — Encounter (HOSPITAL_COMMUNITY): Payer: Self-pay | Admitting: Dentistry
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
## 2016-06-09 ENCOUNTER — Observation Stay (HOSPITAL_COMMUNITY)
Admission: EM | Admit: 2016-06-09 | Discharge: 2016-06-14 | Disposition: A | Discharge status: Home / Self Care | Payer: Medicare Other | Attending: Internal Medicine | Admitting: Internal Medicine

## 2016-06-09 ENCOUNTER — Emergency Department (HOSPITAL_COMMUNITY): Payer: Medicare Other

## 2016-06-09 ENCOUNTER — Encounter (HOSPITAL_COMMUNITY): Payer: Self-pay | Admitting: Emergency Medicine

## 2016-06-09 DIAGNOSIS — R55 Syncope and collapse: Secondary | ICD-10-CM | POA: Diagnosis not present

## 2016-06-09 DIAGNOSIS — I2510 Atherosclerotic heart disease of native coronary artery without angina pectoris: Secondary | ICD-10-CM | POA: Diagnosis not present

## 2016-06-09 DIAGNOSIS — N62 Hypertrophy of breast: Secondary | ICD-10-CM | POA: Insufficient documentation

## 2016-06-09 DIAGNOSIS — G4730 Sleep apnea, unspecified: Secondary | ICD-10-CM | POA: Insufficient documentation

## 2016-06-09 DIAGNOSIS — I252 Old myocardial infarction: Secondary | ICD-10-CM | POA: Diagnosis not present

## 2016-06-09 DIAGNOSIS — I4510 Unspecified right bundle-branch block: Secondary | ICD-10-CM

## 2016-06-09 DIAGNOSIS — Z955 Presence of coronary angioplasty implant and graft: Secondary | ICD-10-CM | POA: Diagnosis not present

## 2016-06-09 DIAGNOSIS — Z880 Allergy status to penicillin: Secondary | ICD-10-CM | POA: Insufficient documentation

## 2016-06-09 DIAGNOSIS — R42 Dizziness and giddiness: Secondary | ICD-10-CM

## 2016-06-09 DIAGNOSIS — F419 Anxiety disorder, unspecified: Secondary | ICD-10-CM | POA: Diagnosis not present

## 2016-06-09 DIAGNOSIS — Z9889 Other specified postprocedural states: Secondary | ICD-10-CM | POA: Insufficient documentation

## 2016-06-09 DIAGNOSIS — J309 Allergic rhinitis, unspecified: Secondary | ICD-10-CM | POA: Diagnosis not present

## 2016-06-09 DIAGNOSIS — Z951 Presence of aortocoronary bypass graft: Secondary | ICD-10-CM | POA: Diagnosis not present

## 2016-06-09 DIAGNOSIS — R0600 Dyspnea, unspecified: Secondary | ICD-10-CM

## 2016-06-09 DIAGNOSIS — Z9221 Personal history of antineoplastic chemotherapy: Secondary | ICD-10-CM | POA: Diagnosis not present

## 2016-06-09 DIAGNOSIS — R0602 Shortness of breath: Secondary | ICD-10-CM | POA: Diagnosis not present

## 2016-06-09 DIAGNOSIS — K8020 Calculus of gallbladder without cholecystitis without obstruction: Secondary | ICD-10-CM | POA: Insufficient documentation

## 2016-06-09 DIAGNOSIS — Z91048 Other nonmedicinal substance allergy status: Secondary | ICD-10-CM | POA: Diagnosis not present

## 2016-06-09 DIAGNOSIS — Z8551 Personal history of malignant neoplasm of bladder: Secondary | ICD-10-CM | POA: Diagnosis not present

## 2016-06-09 DIAGNOSIS — J449 Chronic obstructive pulmonary disease, unspecified: Secondary | ICD-10-CM | POA: Insufficient documentation

## 2016-06-09 DIAGNOSIS — Z79899 Other long term (current) drug therapy: Secondary | ICD-10-CM | POA: Insufficient documentation

## 2016-06-09 DIAGNOSIS — E041 Nontoxic single thyroid nodule: Secondary | ICD-10-CM | POA: Insufficient documentation

## 2016-06-09 DIAGNOSIS — R0609 Other forms of dyspnea: Secondary | ICD-10-CM | POA: Diagnosis not present

## 2016-06-09 DIAGNOSIS — Z9049 Acquired absence of other specified parts of digestive tract: Secondary | ICD-10-CM | POA: Insufficient documentation

## 2016-06-09 DIAGNOSIS — I10 Essential (primary) hypertension: Secondary | ICD-10-CM | POA: Diagnosis present

## 2016-06-09 DIAGNOSIS — Z87891 Personal history of nicotine dependence: Secondary | ICD-10-CM | POA: Diagnosis not present

## 2016-06-09 DIAGNOSIS — M1990 Unspecified osteoarthritis, unspecified site: Secondary | ICD-10-CM | POA: Diagnosis not present

## 2016-06-09 DIAGNOSIS — Z7982 Long term (current) use of aspirin: Secondary | ICD-10-CM | POA: Diagnosis not present

## 2016-06-09 DIAGNOSIS — Z79891 Long term (current) use of opiate analgesic: Secondary | ICD-10-CM | POA: Insufficient documentation

## 2016-06-09 DIAGNOSIS — K219 Gastro-esophageal reflux disease without esophagitis: Secondary | ICD-10-CM | POA: Insufficient documentation

## 2016-06-09 DIAGNOSIS — Z8249 Family history of ischemic heart disease and other diseases of the circulatory system: Secondary | ICD-10-CM | POA: Insufficient documentation

## 2016-06-09 DIAGNOSIS — E785 Hyperlipidemia, unspecified: Secondary | ICD-10-CM | POA: Diagnosis not present

## 2016-06-09 DIAGNOSIS — I517 Cardiomegaly: Secondary | ICD-10-CM | POA: Insufficient documentation

## 2016-06-09 DIAGNOSIS — G8929 Other chronic pain: Secondary | ICD-10-CM | POA: Diagnosis not present

## 2016-06-09 DIAGNOSIS — M25519 Pain in unspecified shoulder: Secondary | ICD-10-CM | POA: Insufficient documentation

## 2016-06-09 DIAGNOSIS — R0789 Other chest pain: Secondary | ICD-10-CM | POA: Insufficient documentation

## 2016-06-09 DIAGNOSIS — K449 Diaphragmatic hernia without obstruction or gangrene: Secondary | ICD-10-CM | POA: Insufficient documentation

## 2016-06-09 DIAGNOSIS — I700 Atherosclerosis of aorta: Secondary | ICD-10-CM | POA: Insufficient documentation

## 2016-06-09 DIAGNOSIS — G629 Polyneuropathy, unspecified: Secondary | ICD-10-CM | POA: Insufficient documentation

## 2016-06-09 HISTORY — DX: Unspecified right bundle-branch block: I45.10

## 2016-06-09 HISTORY — DX: Dizziness and giddiness: R42

## 2016-06-09 HISTORY — DX: Other chronic pain: G89.29

## 2016-06-09 HISTORY — DX: Personal history of nicotine dependence: Z87.891

## 2016-06-09 HISTORY — DX: Other forms of dyspnea: R06.09

## 2016-06-09 LAB — BRAIN NATRIURETIC PEPTIDE: B Natriuretic Peptide: 44.2 pg/mL (ref 0.0–100.0)

## 2016-06-09 LAB — COMPREHENSIVE METABOLIC PANEL
ALBUMIN: 3.8 g/dL (ref 3.5–5.0)
ALK PHOS: 49 U/L (ref 38–126)
ALT: 21 U/L (ref 17–63)
ANION GAP: 5 (ref 5–15)
AST: 20 U/L (ref 15–41)
BUN: 19 mg/dL (ref 6–20)
CALCIUM: 8.9 mg/dL (ref 8.9–10.3)
CO2: 22 mmol/L (ref 22–32)
CREATININE: 1.02 mg/dL (ref 0.61–1.24)
Chloride: 108 mmol/L (ref 101–111)
GFR calc Af Amer: >60 mL/min (ref >60)
GFR calc non Af Amer: >60 mL/min (ref >60)
GLUCOSE: 116 mg/dL — AB (ref 65–99)
Potassium: 3.8 mmol/L (ref 3.5–5.1)
SODIUM: 135 mmol/L (ref 135–145)
Total Bilirubin: 1.3 mg/dL — ABNORMAL HIGH (ref 0.3–1.2)
Total Protein: 6.9 g/dL (ref 6.5–8.1)

## 2016-06-09 LAB — I-STAT TROPONIN, ED: Troponin i, poc: 0 ng/mL (ref 0.00–0.08)

## 2016-06-09 LAB — CBC WITH DIFFERENTIAL/PLATELET
BASOS PCT: 0 %
Basophils Absolute: 0 10*3/uL (ref 0.0–0.1)
Eosinophils Absolute: 0.1 10*3/uL (ref 0.0–0.7)
Eosinophils Relative: 1 %
HEMATOCRIT: 43.4 % (ref 39.0–52.0)
Hemoglobin: 14.9 g/dL (ref 13.0–17.0)
Lymphocytes Relative: 22 %
Lymphs Abs: 2.2 10*3/uL (ref 0.7–4.0)
MCH: 31.8 pg (ref 26.0–34.0)
MCHC: 34.3 g/dL (ref 30.0–36.0)
MCV: 92.7 fL (ref 78.0–100.0)
MONO ABS: 0.9 10*3/uL (ref 0.1–1.0)
MONOS PCT: 9 %
NEUTROS ABS: 6.7 10*3/uL (ref 1.7–7.7)
Neutrophils Relative %: 68 %
Platelets: 245 10*3/uL (ref 150–400)
RBC: 4.68 MIL/uL (ref 4.22–5.81)
RDW: 12.9 % (ref 11.5–15.5)
WBC: 9.9 10*3/uL (ref 4.0–10.5)

## 2016-06-09 LAB — URINALYSIS, ROUTINE W REFLEX MICROSCOPIC
BILIRUBIN URINE: NEGATIVE
Glucose, UA: NEGATIVE mg/dL
HGB URINE DIPSTICK: NEGATIVE
Ketones, ur: NEGATIVE mg/dL
Leukocytes, UA: NEGATIVE
Nitrite: NEGATIVE
PH: 5.5 (ref 5.0–8.0)
Protein, ur: NEGATIVE mg/dL
SPECIFIC GRAVITY, URINE: 1.015 (ref 1.005–1.030)

## 2016-06-09 LAB — TSH: TSH: 2.255 u[IU]/mL (ref 0.350–4.500)

## 2016-06-09 LAB — TROPONIN I

## 2016-06-09 LAB — CBG MONITORING, ED: Glucose-Capillary: 108 mg/dL — ABNORMAL HIGH (ref 65–99)

## 2016-06-09 MED ORDER — ASPIRIN 81 MG PO CHEW
324.0000 mg | CHEWABLE_TABLET | Freq: Once | ORAL | Status: AC
  Administered 2016-06-09: 324 mg via ORAL
  Filled 2016-06-09: qty 4

## 2016-06-09 MED ORDER — LISINOPRIL 10 MG PO TABS
10.0000 mg | ORAL_TABLET | Freq: Every day | ORAL | Status: DC
  Administered 2016-06-09: 10 mg via ORAL
  Filled 2016-06-09 (×2): qty 1

## 2016-06-09 MED ORDER — ONDANSETRON HCL 4 MG/2ML IJ SOLN: 4.0000 mg | Freq: Four times a day (QID) | INTRAMUSCULAR | Status: DC | PRN

## 2016-06-09 MED ORDER — SODIUM CHLORIDE 0.9% FLUSH: 3.0000 mL | INTRAVENOUS | Status: DC | PRN

## 2016-06-09 MED ORDER — ASPIRIN EC 81 MG PO TBEC
81.0000 mg | DELAYED_RELEASE_TABLET | Freq: Every day | ORAL | Status: DC
  Administered 2016-06-10 – 2016-06-14 (×5): 81 mg via ORAL
  Filled 2016-06-09 (×5): qty 1

## 2016-06-09 MED ORDER — INFLUENZA VAC SPLIT QUAD 0.5 ML IM SUSY
0.5000 mL | PREFILLED_SYRINGE | INTRAMUSCULAR | Status: AC
  Administered 2016-06-10: 0.5 mL via INTRAMUSCULAR
  Filled 2016-06-09: qty 0.5

## 2016-06-09 MED ORDER — OXYCODONE HCL 5 MG PO TABS
5.0000 mg | ORAL_TABLET | Freq: Three times a day (TID) | ORAL | Status: DC
Start: 2016-06-09 — End: 2016-06-14
  Administered 2016-06-09 – 2016-06-14 (×16): 5 mg via ORAL
  Filled 2016-06-09 (×16): qty 1

## 2016-06-09 MED ORDER — OMEGA-3-ACID ETHYL ESTERS 1 G PO CAPS
1.0000 g | ORAL_CAPSULE | Freq: Every day | ORAL | Status: DC
  Administered 2016-06-10 – 2016-06-14 (×5): 1 g via ORAL
  Filled 2016-06-09 (×5): qty 1

## 2016-06-09 MED ORDER — HEPARIN SODIUM (PORCINE) 5000 UNIT/ML IJ SOLN
5000.0000 [IU] | Freq: Three times a day (TID) | INTRAMUSCULAR | Status: DC
  Administered 2016-06-09 – 2016-06-14 (×8): 5000 [IU] via SUBCUTANEOUS
  Filled 2016-06-09 (×8): qty 1

## 2016-06-09 MED ORDER — NITROGLYCERIN 0.4 MG SL SUBL: 0.4000 mg | SUBLINGUAL_TABLET | SUBLINGUAL | Status: DC | PRN

## 2016-06-09 MED ORDER — DOCUSATE SODIUM 100 MG PO CAPS
100.0000 mg | ORAL_CAPSULE | Freq: Two times a day (BID) | ORAL | Status: DC
  Administered 2016-06-09 – 2016-06-14 (×10): 100 mg via ORAL
  Filled 2016-06-09 (×10): qty 1

## 2016-06-09 MED ORDER — ACETAMINOPHEN 325 MG PO TABS: 650.0000 mg | ORAL_TABLET | ORAL | Status: DC | PRN

## 2016-06-09 MED ORDER — SODIUM CHLORIDE 0.9% FLUSH
3.0000 mL | Freq: Two times a day (BID) | INTRAVENOUS | Status: DC
  Administered 2016-06-09 – 2016-06-14 (×9): 3 mL via INTRAVENOUS

## 2016-06-09 MED ORDER — SODIUM CHLORIDE 0.9 % IV SOLN: 250.0000 mL | INTRAVENOUS | Status: DC | PRN

## 2016-06-09 MED ORDER — HYDROCHLOROTHIAZIDE 12.5 MG PO CAPS
12.5000 mg | ORAL_CAPSULE | Freq: Every day | ORAL | Status: DC
  Filled 2016-06-09 (×2): qty 1

## 2016-06-09 MED ORDER — TAMSULOSIN HCL 0.4 MG PO CAPS
0.4000 mg | ORAL_CAPSULE | Freq: Every day | ORAL | Status: DC
  Administered 2016-06-10 – 2016-06-13 (×4): 0.4 mg via ORAL
  Filled 2016-06-09 (×4): qty 1
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
## 2016-06-09 NOTE — ED Notes (Signed)
Pt denies chest pain, SOB still on exertion as pt stands to use the bathroom.
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
## 2016-06-09 NOTE — ED Notes (Signed)
CBG 108. 

## 2016-06-09 NOTE — ED Triage Notes (Signed)
Patient states started having intermittent dizziness, shortness of breath with exertion.   Patient states no chest pain, "but feels like it did when I had heart attack".   Patient states some nausea and diaphoresis.   Patient has extensive cardiac history.

## 2016-06-09 NOTE — ED Notes (Signed)
RN attempted report; RN to call back

## 2016-06-10 ENCOUNTER — Observation Stay (HOSPITAL_COMMUNITY): Payer: Medicare Other

## 2016-06-10 ENCOUNTER — Observation Stay (HOSPITAL_BASED_OUTPATIENT_CLINIC_OR_DEPARTMENT_OTHER): Payer: Medicare Other

## 2016-06-10 DIAGNOSIS — R0602 Shortness of breath: Secondary | ICD-10-CM

## 2016-06-10 DIAGNOSIS — R079 Chest pain, unspecified: Secondary | ICD-10-CM | POA: Diagnosis not present

## 2016-06-10 DIAGNOSIS — R0609 Other forms of dyspnea: Secondary | ICD-10-CM | POA: Diagnosis not present

## 2016-06-10 LAB — TROPONIN I: Troponin I: <0.03 ng/mL (ref <0.03)

## 2016-06-10 LAB — BASIC METABOLIC PANEL
Anion gap: 9 (ref 5–15)
BUN: 18 mg/dL (ref 6–20)
CALCIUM: 9.3 mg/dL (ref 8.9–10.3)
CHLORIDE: 101 mmol/L (ref 101–111)
CO2: 24 mmol/L (ref 22–32)
CREATININE: 1.31 mg/dL — AB (ref 0.61–1.24)
GFR calc Af Amer: >60 mL/min (ref >60)
GFR calc non Af Amer: 55 mL/min — ABNORMAL LOW (ref >60)
Glucose, Bld: 141 mg/dL — ABNORMAL HIGH (ref 65–99)
Potassium: 4.4 mmol/L (ref 3.5–5.1)
Sodium: 134 mmol/L — ABNORMAL LOW (ref 135–145)

## 2016-06-10 LAB — CBC
HCT: 49.1 % (ref 39.0–52.0)
Hemoglobin: 16.3 g/dL (ref 13.0–17.0)
MCH: 31.6 pg (ref 26.0–34.0)
MCHC: 33.2 g/dL (ref 30.0–36.0)
MCV: 95.2 fL (ref 78.0–100.0)
PLATELETS: 218 10*3/uL (ref 150–400)
RBC: 5.16 MIL/uL (ref 4.22–5.81)
RDW: 12.7 % (ref 11.5–15.5)
WBC: 12.4 10*3/uL — ABNORMAL HIGH (ref 4.0–10.5)

## 2016-06-10 LAB — NM MYOCAR MULTI W/SPECT W/WALL MOTION / EF
Peak HR: 118 {beats}/min
Rest HR: 63 {beats}/min

## 2016-06-10 MED ORDER — GI COCKTAIL ~~LOC~~
30.0000 mL | Freq: Once | ORAL | Status: AC
  Administered 2016-06-10: 30 mL via ORAL
  Filled 2016-06-10: qty 30

## 2016-06-10 MED ORDER — ALPRAZOLAM 0.5 MG PO TABS
0.5000 mg | ORAL_TABLET | Freq: Once | ORAL | Status: AC
  Administered 2016-06-10: 0.5 mg via ORAL

## 2016-06-10 MED ORDER — TECHNETIUM TC 99M TETROFOSMIN IV KIT
30.0000 | PACK | Freq: Once | INTRAVENOUS | Status: AC | PRN
  Administered 2016-06-10: 30 via INTRAVENOUS

## 2016-06-10 MED ORDER — TECHNETIUM TC 99M TETROFOSMIN IV KIT
10.0000 | PACK | Freq: Once | INTRAVENOUS | Status: AC | PRN
  Administered 2016-06-10: 10 via INTRAVENOUS

## 2016-06-10 MED ORDER — SODIUM CHLORIDE 0.9 % IV BOLUS (SEPSIS)
500.0000 mL | Freq: Once | INTRAVENOUS | Status: AC
  Administered 2016-06-10: 500 mL via INTRAVENOUS

## 2016-06-10 MED ORDER — REGADENOSON 0.4 MG/5ML IV SOLN
INTRAVENOUS | Status: AC
  Administered 2016-06-10: 11:00:00
  Filled 2016-06-10: qty 5

## 2016-06-10 NOTE — Progress Notes (Signed)
When getting pt ready for ambulation to check O2 as ordered, pt complains of feeling dizzy when he sat up on the side of the bed. His BP was 74/46 with HR of 104, O2 95% on RA. Pt unable to ambulate d/t dizziness. Orthostatic VS was obtained. Dr. Eula Fried was called with the result and order received 500 ml NS bolus over 2 hrs.     06/10/16 1837  Orthostatic Lying   BP- Lying 96/86  Pulse- Lying 82  Orthostatic Sitting  BP- Sitting (!) 84/50  Pulse- Sitting 87  Orthostatic Standing at 0 minutes  BP- Standing at 0 minutes (!) 63/43  Pulse- Standing at 0 minutes 111  Orthostatic Standing at 3 minutes  BP- Standing at 3 minutes (!) 83/49  Pulse- Standing at 3 minutes 105  Oxygen Therapy  SpO2 96 %  O2 Device Room Air

## 2016-06-10 NOTE — Progress Notes (Signed)
Patient refused CPAP tonight. There Isn't a machine in the room at this time. RN aware. Explained to Patient that if they changed their mind, to just have the RN call Respiratory and we would come set them up. 

## 2016-06-10 NOTE — Significant Event (Signed)
Rapid Response Event Note  Overview: Time Called: 0820 Arrival Time: 0832 Event Type: Unknown  Initial Focused Assessment: Called to see patient for sudden onset of dizzyiness, lightheadedness, nausea, and felt like he was going to pass out. This happened at the point of injection preparing for nuclear medicine study.  Upon arrive al patient is alert and oriented, pale, slightly diaphoretic. VS obtained and stable, however HR was 52, but regular. Good pulses.  States this has happened upon multiple occasions, usually associated with entry into blood vessel (ie, cardiac cath, previous Cardiolite injection for study, etc)/  Reviewed chart. Inpatient RN called for any pertinent information from last night's report.   No pain reported associated with event.   Interventions: Applied Oxygen at 2L per [place]. VS monitoring. PA called by [facility] staff; spoke with PA on phone and she is coming to check patient to clear for proceeding with test.  After approx. 20 minutes of monitoring states he feels better, but not back to baseline. HR still 55, BP stable at 140/80.  Color good. No further diaphoresis noted (occurance was mild)  Plan of Care (if not transferred): Patient says he would like to proceed with the test.  RN present; will assist as needed.  Event Summary:  LEft to [facility] staff at [facility]      at          [facility]

## 2016-06-10 NOTE — Progress Notes (Signed)
Patient Name: Erik Alvarado Date of Encounter: 06/10/2016     Principal Problem:   DOE (dyspnea on exertion) Active Problems:   S/P CABG x 4   Coronary atherosclerosis of native coronary artery   Hyperlipidemia   Essential hypertension   Dizziness   Chronic pain   Dyspnea on exertion    SUBJECTIVE  Seen in nuc med for lexiscan myoview. Test was prolonged due to a vagal event after initial cardiolite injection. He became very anxious but improved with xanax. Felt very nauseated with lexiscan. No CP    CURRENT MEDS . aspirin EC  81 mg Oral Daily  . docusate sodium  100 mg Oral BID  . heparin  5,000 Units Subcutaneous Q8H  . hydrochlorothiazide  12.5 mg Oral Daily  . Influenza vac split quadrivalent PF  0.5 mL Intramuscular Tomorrow-1000  . lisinopril  10 mg Oral Daily  . omega-3 acid ethyl esters  1 g Oral Daily  . oxyCODONE  5 mg Oral Q8H  . sodium chloride flush  3 mL Intravenous Q12H  . tamsulosin  0.4 mg Oral QPC breakfast    OBJECTIVE  Vitals:   06/10/16 0601 06/10/16 0842 06/10/16 0845 06/10/16 0905  BP: (!) 95/49 140/90 140/80 132/80  Pulse: 67 (!) 52 (!) 52   Resp: 20 16    Temp: 97.7 F (36.5 C)     TempSrc: Oral     SpO2: 99% 100%  (!) 0%  Weight: 184 lb 8 oz (83.7 kg)     Height:        Intake/Output Summary (Last 24 hours) at 06/10/16 0955 Last data filed at 06/10/16 0600  Gross per 24 hour  Intake              240 ml  Output              750 ml  Net             -510 ml   Filed Weights   06/09/16 1106 06/09/16 2000 06/10/16 0601  Weight: 192 lb (87.1 kg) 184 lb 11.2 oz (83.8 kg) 184 lb 8 oz (83.7 kg)    PHYSICAL EXAM  General: Pleasant, NAD. Neuro: Alert and oriented X 3. Moves all extremities spontaneously. Psych: Normal affect. HEENT:  Normal  Neck: Supple without bruits or JVD. Lungs:  Resp regular and unlabored, CTA. Heart: RRR no s3, s4, or murmurs. Abdomen: Soft, non-tender, non-distended, BS + x 4.  Extremities: No  clubbing, cyanosis or edema. DP/PT/Radials 2+ and equal bilaterally.  Accessory Clinical Findings  CBC  Recent Labs  06/09/16 1126  WBC 9.9  NEUTROABS 6.7  HGB 14.9  HCT 43.4  MCV 92.7  PLT 99991111   Basic Metabolic Panel  Recent Labs  06/09/16 1126  NA 135  K 3.8  CL 108  CO2 22  GLUCOSE 116*  BUN 19  CREATININE 1.02  CALCIUM 8.9   Liver Function Tests  Recent Labs  06/09/16 1126  AST 20  ALT 21  ALKPHOS 49  BILITOT 1.3*  PROT 6.9  ALBUMIN 3.8   No results for input(s): LIPASE, AMYLASE in the last 72 hours. Cardiac Enzymes  Recent Labs  06/09/16 2035 06/10/16 0245  TROPONINI <0.03 <0.03   BNP Invalid input(s): POCBNP D-Dimer No results for input(s): DDIMER in the last 72 hours. Hemoglobin A1C No results for input(s): HGBA1C in the last 72 hours. Fasting Lipid Panel No results for input(s): CHOL, HDL, LDLCALC, TRIG, CHOLHDL, LDLDIRECT  in the last 72 hours. Thyroid Function Tests  Recent Labs  06/09/16 2035  TSH 2.255    TELE  NSR  Radiology/Studies  Dg Chest 2 View  Result Date: 06/09/2016 CLINICAL DATA:  Shortness of breath and dizziness EXAM: CHEST  2 VIEW COMPARISON:  Jan 23, 2016 FINDINGS: Lungs are clear. Heart size and pulmonary vascularity are normal. Patient is status post coronary artery bypass grafting. No adenopathy. A small metallic foreign body overlies the right upper chest. There is anterior wedging of a lower thoracic vertebral body, stable. There is no evident adenopathy. IMPRESSION: No edema or consolidation. Electronically Signed   By: Erik Alvarado M.D.   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  Dizziness: dizziness occurred when he was normotensive. Has occurred for several hours, also lasted all day long one day. Not clearly tied to BP or heart rhythm issue but admitted anyway. May need carotid duplex as OP.   Dyspnea: it's possible this could represent an anginal equivalent. If he rules out and nuc is normal, plan is to set up for outpatient PFTs and pulm referral. Underwent nuclear stress test this AM. He did have a vagal reaction related to the cardiolite. He was very anxious about proceeding with test. He was given some xanax with successful completion of test. Awaiting nuclear images.   CAD: continue ASA,. Not on statin due to possible contribution towards bladder CA per his report. Not currently on BB but with dyspnea and dizziness, would not start during this admission.  Hyperlipidemia: as above. F/u OP for Repatha pending.  HTN: currently well controlled   Chronic pain: continue home regimen of oxycodone 5mg  TID.   Signed, Erik Form PA-C  Pager [phone]  History and all data above reviewed.  Patient examined.  I agree with the findings as above.  Felt very light headed and unwell during the stress test.  No acute complaints at present.  The patient exam reveals COR:RRR  ,  Lungs: Clear  ,  Abd: Positive bowel sounds, no rebound no guarding, Ext No edema  .  All available labs, radiology testing, previous records reviewed. Agree with documented assessment and plan. Chest pain:  Awaiting stress test results.  However, he does not think that we should believe the results of a stress test as it has been wrong in the past.  He needs to ambulate and we need to record his HR, BP and oxygen sats.  Likely not home today.    Erik Alvarado  11:50  AM  06/10/2016

## 2016-06-11 ENCOUNTER — Observation Stay (HOSPITAL_COMMUNITY): Payer: Medicare Other

## 2016-06-11 ENCOUNTER — Encounter (HOSPITAL_COMMUNITY): Payer: Self-pay | Admitting: Radiology

## 2016-06-11 DIAGNOSIS — R0609 Other forms of dyspnea: Secondary | ICD-10-CM | POA: Diagnosis not present

## 2016-06-11 DIAGNOSIS — R42 Dizziness and giddiness: Secondary | ICD-10-CM | POA: Diagnosis not present

## 2016-06-11 DIAGNOSIS — Z91048 Other nonmedicinal substance allergy status: Secondary | ICD-10-CM | POA: Diagnosis not present

## 2016-06-11 DIAGNOSIS — Z9889 Other specified postprocedural states: Secondary | ICD-10-CM | POA: Diagnosis not present

## 2016-06-11 DIAGNOSIS — Z79899 Other long term (current) drug therapy: Secondary | ICD-10-CM | POA: Diagnosis not present

## 2016-06-11 DIAGNOSIS — G629 Polyneuropathy, unspecified: Secondary | ICD-10-CM | POA: Diagnosis not present

## 2016-06-11 DIAGNOSIS — J309 Allergic rhinitis, unspecified: Secondary | ICD-10-CM | POA: Diagnosis not present

## 2016-06-11 DIAGNOSIS — I4510 Unspecified right bundle-branch block: Secondary | ICD-10-CM | POA: Diagnosis present

## 2016-06-11 DIAGNOSIS — Z7982 Long term (current) use of aspirin: Secondary | ICD-10-CM | POA: Diagnosis not present

## 2016-06-11 DIAGNOSIS — I10 Essential (primary) hypertension: Secondary | ICD-10-CM | POA: Diagnosis not present

## 2016-06-11 DIAGNOSIS — M1990 Unspecified osteoarthritis, unspecified site: Secondary | ICD-10-CM | POA: Diagnosis not present

## 2016-06-11 DIAGNOSIS — R55 Syncope and collapse: Secondary | ICD-10-CM | POA: Diagnosis present

## 2016-06-11 DIAGNOSIS — Z8551 Personal history of malignant neoplasm of bladder: Secondary | ICD-10-CM | POA: Diagnosis not present

## 2016-06-11 DIAGNOSIS — Z87891 Personal history of nicotine dependence: Secondary | ICD-10-CM | POA: Diagnosis not present

## 2016-06-11 DIAGNOSIS — G8929 Other chronic pain: Secondary | ICD-10-CM | POA: Diagnosis not present

## 2016-06-11 DIAGNOSIS — J449 Chronic obstructive pulmonary disease, unspecified: Secondary | ICD-10-CM | POA: Diagnosis not present

## 2016-06-11 DIAGNOSIS — Z79891 Long term (current) use of opiate analgesic: Secondary | ICD-10-CM | POA: Diagnosis not present

## 2016-06-11 DIAGNOSIS — Z955 Presence of coronary angioplasty implant and graft: Secondary | ICD-10-CM | POA: Diagnosis not present

## 2016-06-11 DIAGNOSIS — Z9221 Personal history of antineoplastic chemotherapy: Secondary | ICD-10-CM | POA: Diagnosis not present

## 2016-06-11 DIAGNOSIS — K219 Gastro-esophageal reflux disease without esophagitis: Secondary | ICD-10-CM | POA: Diagnosis not present

## 2016-06-11 DIAGNOSIS — I2510 Atherosclerotic heart disease of native coronary artery without angina pectoris: Secondary | ICD-10-CM | POA: Diagnosis not present

## 2016-06-11 DIAGNOSIS — E785 Hyperlipidemia, unspecified: Secondary | ICD-10-CM | POA: Diagnosis not present

## 2016-06-11 DIAGNOSIS — R0602 Shortness of breath: Secondary | ICD-10-CM | POA: Diagnosis not present

## 2016-06-11 DIAGNOSIS — I252 Old myocardial infarction: Secondary | ICD-10-CM | POA: Diagnosis not present

## 2016-06-11 DIAGNOSIS — F419 Anxiety disorder, unspecified: Secondary | ICD-10-CM | POA: Diagnosis not present

## 2016-06-11 DIAGNOSIS — Z9049 Acquired absence of other specified parts of digestive tract: Secondary | ICD-10-CM | POA: Diagnosis not present

## 2016-06-11 DIAGNOSIS — Z951 Presence of aortocoronary bypass graft: Secondary | ICD-10-CM | POA: Diagnosis not present

## 2016-06-11 MED ORDER — IOPAMIDOL (ISOVUE-370) INJECTION 76%
INTRAVENOUS | Status: AC
  Administered 2016-06-11: 100 mL
  Filled 2016-06-11: qty 100

## 2016-06-11 NOTE — Progress Notes (Signed)
Patient Name: Erik Alvarado Date of Encounter: 06/11/2016  Hospital Problem List     Principal Problem:   DOE (dyspnea on exertion) Active Problems:   S/P CABG x 4   Coronary atherosclerosis of native coronary artery   Hyperlipidemia   Essential hypertension   Dizziness   Chronic pain   Dyspnea on exertion    Patient Profile     Mr. 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  Subjective   Significant hypotension with orthostatic drop.  Very dizzy on walking yesterday.  Give IV fluid bolus.    Inpatient Medications    . aspirin EC  81 mg Oral Daily  . docusate sodium  100 mg Oral BID  . heparin  5,000 Units Subcutaneous Q8H  . hydrochlorothiazide  12.5 mg Oral Daily  . lisinopril  10 mg Oral Daily  . omega-3 acid ethyl esters  1 g Oral Daily  . oxyCODONE  5 mg Oral Q8H  . sodium chloride flush  3 mL Intravenous Q12H  . tamsulosin  0.4 mg Oral QPC breakfast    Vital Signs    Vitals:   06/10/16 1837 06/10/16 1944 06/11/16 0546 06/11/16 0900  BP:  (!) 81/53 (!) 97/58 105/60  Pulse:  85 (!) 58 64  Resp:  16 18 18   Temp:  98.1 F (36.7 C) 98 F (36.7 C)   TempSrc:  Oral Oral   SpO2: 96% 99% 99% 99%  Weight:   187 lb 8 oz (85 kg)   Height:        Intake/Output Summary (Last 24 hours) at 06/11/16 1111 Last data filed at 06/11/16 0900  Gross per 24 hour  Intake             1540 ml  Output             1000 ml  Net              540 ml   Filed Weights   06/09/16  2000 06/10/16 0601 06/11/16 0546  Weight: 184 lb 11.2 oz (83.8 kg) 184 lb 8 oz (83.7 kg) 187 lb 8 oz (85 kg)    Physical Exam    GEN: Well nourished, well developed, in  no acute distress.  Neck: Supple, no JVD, carotid bruits, or masses. Cardiac: RRR, no rubs, or gallops. No clubbing, cyanosis,  edema.  Radials/DP/PT 2+ and equal bilaterally.  Respiratory:  Respirations  regular and unlabored, clear to auscultation bilaterally. GI: Soft, nontender, nondistended, BS + x 4. Neuro:  Strength and sensation are intact.   Labs    CBC  Recent Labs  06/09/16 1126 06/10/16 1341  WBC 9.9 12.4*  NEUTROABS 6.7  --   HGB 14.9 16.3  HCT 43.4 49.1  MCV 92.7 95.2  PLT 245 [id number]   Basic Metabolic Panel  Recent Labs  06/09/16 1126 06/10/16 1341  NA 135 134*  K 3.8 4.4  CL 108 101  CO2 22 24  GLUCOSE 116* 141*  BUN  19 18  CREATININE 1.02 1.31*  CALCIUM 8.9 9.3   Liver Function Tests  Recent Labs  06/09/16 1126  AST 20  ALT 21  ALKPHOS 49  BILITOT 1.3*  PROT 6.9  ALBUMIN 3.8   No results for input(s): LIPASE, AMYLASE in the last 72 hours. Cardiac Enzymes  Recent Labs  06/09/16 2035 06/10/16 0245 06/10/16 1341  TROPONINI <0.03 <0.03 <0.03   BNP Invalid input(s): POCBNP D-Dimer No results for input(s): DDIMER in the last 72 hours. Hemoglobin A1C No results for input(s): HGBA1C in the last 72 hours. Fasting Lipid Panel No results for input(s): CHOL, HDL, LDLCALC, TRIG, CHOLHDL, LDLDIRECT in the last 72 hours. Thyroid Function Tests  Recent Labs  06/09/16 2035  TSH 2.255    Telemetry    Sinus, Sinus Tach  Stress Test     IMPRESSION: 1. There is a large fixed perfusion defect in the inferior wall and adjoining walls without LV dilatation.  2. Normal left ventricular wall motion.  3. Left ventricular ejection fraction 67%   Radiology    Dg Chest 2 View  Result Date: 06/09/2016 CLINICAL DATA:  Shortness of breath and dizziness EXAM: CHEST   2 VIEW COMPARISON:  Jan 23, 2016 FINDINGS: Lungs are clear. Heart size and pulmonary vascularity are normal. Patient is status post coronary artery bypass grafting. No adenopathy. A small metallic foreign body overlies the right upper chest. There is anterior wedging of a lower thoracic vertebral body, stable. There is no evident adenopathy. IMPRESSION: No edema or consolidation. Electronically Signed   By: Lowella Grip III M.D.   On: 06/09/2016 13:26   Nm Myocar Multi W/spect W/wall Motion / Ef  Result Date: 06/10/2016 CLINICAL DATA:  Chest pain EXAM: MYOCARDIAL IMAGING WITH SPECT (REST AND PHARMACOLOGIC-STRESS) GATED LEFT VENTRICULAR WALL MOTION STUDY LEFT VENTRICULAR EJECTION FRACTION TECHNIQUE: Standard myocardial SPECT imaging was performed after resting intravenous injection of 10 mCi Tc-27m tetrofosmin. Subsequently, intravenous infusion of Lexiscan was performed under the supervision of the Cardiology staff. At peak effect of the drug, 30 mCi Tc-57m tetrofosmin was injected intravenously and standard myocardial SPECT imaging was performed. Quantitative gated imaging was also performed to evaluate left ventricular wall motion, and estimate left ventricular ejection fraction. COMPARISON:  None. FINDINGS: Perfusion: There is a large fixed perfusion defect involving the entire inferior wall, the adjacent septum, in the adjacent lateral wall. There is probably a small area of stress-induced perfusion defect at the lateral extent of the fixed defect. Wall Motion: Normal left ventricular wall motion. No left ventricular dilation. Left Ventricular Ejection Fraction: 67 % End diastolic volume 60 ml End systolic volume 20 ml IMPRESSION: 1. There is a large fixed perfusion defect in the inferior wall and adjoining walls without LV dilatation. 2. Normal left ventricular wall motion. 3. Left ventricular ejection fraction 67% 4. Non invasive risk stratification*: Low risk. Please note that there is a large fixed  perfusion defect in the inferior wall but there is no LV dilatation. *2012 Appropriate Use Criteria for Coronary Revascularization Focused Update: J Am Coll Cardiol. [id number]. http://content.[email or web address].aspx?articleid=1201161 Electronically Signed   By: Marybelle Killings M.D.   On: 06/10/2016 12:57    Assessment & Plan    DIZZINESS:  Secondary to orthostasis.  His BP is usually high.  He has had very limited mobility recently secondary to back pain.  He has dyspnea.  I am going to check a CT to rule out pulmonary embolism.    CAD:  His stress test was normal.  However, he reports false negative tests in the past.  Low threshold for invasive testing if he remains symptomatic without other cause for his dyspnea.   HTN:  Holding meds as above.    CHRONIC PAIN:  Continue current therapy.   DYSLIPIDEMIA:   Continue current therapy.   Signed, Minus Breeding, MD  06/11/2016, 11:11 AM

## 2016-06-11 NOTE — Progress Notes (Signed)
Patient refuses CPAP for the night. RT instructed patient to notify respiratory if he changed his mind. RT will continue to monitor.

## 2016-06-12 ENCOUNTER — Ambulatory Visit: Payer: Medicare Other | Admitting: Cardiology

## 2016-06-12 DIAGNOSIS — I2510 Atherosclerotic heart disease of native coronary artery without angina pectoris: Secondary | ICD-10-CM | POA: Diagnosis not present

## 2016-06-12 DIAGNOSIS — R0609 Other forms of dyspnea: Secondary | ICD-10-CM | POA: Diagnosis not present

## 2016-06-12 DIAGNOSIS — Z951 Presence of aortocoronary bypass graft: Secondary | ICD-10-CM | POA: Diagnosis not present

## 2016-06-12 DIAGNOSIS — R42 Dizziness and giddiness: Secondary | ICD-10-CM | POA: Diagnosis not present

## 2016-06-12 DIAGNOSIS — E785 Hyperlipidemia, unspecified: Secondary | ICD-10-CM | POA: Diagnosis not present

## 2016-06-12 DIAGNOSIS — I10 Essential (primary) hypertension: Secondary | ICD-10-CM | POA: Diagnosis not present

## 2016-06-12 MED ORDER — EZETIMIBE 10 MG PO TABS
10.0000 mg | ORAL_TABLET | Freq: Every day | ORAL | Status: DC
  Administered 2016-06-12 – 2016-06-14 (×3): 10 mg via ORAL
  Filled 2016-06-12 (×3): qty 1

## 2016-06-12 MED ORDER — CARVEDILOL 3.125 MG PO TABS
1.5600 mg | ORAL_TABLET | Freq: Two times a day (BID) | ORAL | Status: DC
  Administered 2016-06-12 – 2016-06-13 (×2): 1.56 mg via ORAL
  Filled 2016-06-12 (×2): qty 1

## 2016-06-12 NOTE — Progress Notes (Signed)
Patient Profile     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  Subjective:  No chest pain; dizzy when stands  Objective:   Vital Signs : Vitals:   06/11/16 1344 06/11/16 1543 06/11/16 2035 06/12/16 0610  BP: 112/72 125/62 139/72 126/70  Pulse: 86  80 (!) 59  Resp: '18  18 18  '$ Temp: 98.5 F (36.9 C)  98.4 F (36.9 C) 98.5 F (36.9 C)  TempSrc: Oral  Oral Oral  SpO2: 98%  98% 97%  Weight:      Height:        Intake/Output from previous day:  Intake/Output Summary (Last 24 hours) at 06/12/16 1045 Last data filed at 06/12/16 1002  Gross per 24 hour  Intake             1200 ml  Output             2826 ml  Net            -1626 ml    I/O since admission: -1796  Wt Readings from Last 3 Encounters:  06/11/16 187 lb 8 oz (85 kg)  09/09/15 190 lb (86.2 kg)  05/21/15 192 lb (87.1 kg)    Medications: . aspirin EC  81 mg Oral Daily  . docusate sodium  100 mg Oral BID  . heparin  5,000 Units Subcutaneous Q8H  . omega-3 acid ethyl esters  1 g Oral Daily  . oxyCODONE  5 mg Oral Q8H  . sodium chloride flush  3 mL Intravenous Q12H  . tamsulosin  0.4 mg Oral QPC breakfast       Physical Exam:  Orthosatic BP  125/62 P 82 supine; 125/69 P 79 sitting; 93/56 P 108 standing General appearance: alert, cooperative and no distress Neck: no adenopathy, no carotid bruit, no JVD, supple, symmetrical, trachea midline and thyroid not enlarged, symmetric, no  tenderness/mass/nodules Lungs: no rales Heart: regular rate and rhythm and 1/6 systolic murmur Abdomen: soft, non-tender; bowel sounds normal; no masses,  no organomegaly Extremities: no edema, redness or tenderness in the calves or thighs Skin: Skin color, texture, turgor normal. No rashes or lesions Neurologic: Grossly normal    Rate: 70  Rhythm: normal sinus rhythm  ECG (independently read by me): NST aty 81, Inferior Q,  RBBB with repolarization  Lab Results:   Recent Labs  06/09/16 1126 06/10/16 1341  NA 135 134*  K 3.8 4.4  CL 108 101  CO2 22 24  GLUCOSE 116* 141*  BUN 19 18  CREATININE 1.02 1.31*  CALCIUM 8.9 9.3    Hepatic Function Latest Ref Rng & Units 06/09/2016 05/21/2015 09/10/2013  Total Protein 6.5 - 8.1 g/dL 6.9 7.0 7.4  Albumin 3.5 - 5.0 g/dL 3.8 3.9 3.8  AST 15 - 41 U/L '20 17 14  '$ ALT 17 - 63 U/L '21 15 16  '$ Alk Phosphatase 38 - 126 U/L 49 61 84  Total Bilirubin 0.3 - 1.2 mg/dL 1.3(H) 0.4 1.0  Bilirubin, Direct 0.0 - 0.3  mg/dL - - 0.1     Recent Labs  06/09/16 1126 06/10/16 1341  WBC 9.9 12.4*  NEUTROABS 6.7  --   HGB 14.9 16.3  HCT 43.4 49.1  MCV 92.7 95.2  PLT 245 218     Recent Labs  06/09/16 2035 06/10/16 0245 06/10/16 1341  TROPONINI <0.03 <0.03 <0.03    Lab Results  Component Value Date   TSH 2.255 06/09/2016   No results for input(s): HGBA1C in the last 72 hours.   Recent Labs  06/09/16 1126  PROT 6.9  ALBUMIN 3.8  AST 20  ALT 21  ALKPHOS 49  BILITOT 1.3*   No results for input(s): INR in the last 72 hours. BNP (last 3 results)  Recent Labs  06/09/16 1125  BNP 44.2    ProBNP (last 3 results) No results for input(s): PROBNP in the last 8760 hours.   Lipid Panel     Component Value Date/Time   CHOL 190 05/21/2015 1145   TRIG 82 05/21/2015 1145   HDL 50 05/21/2015 1145   CHOLHDL 3.8 05/21/2015 1145   VLDL 16 05/21/2015 1145   LDLCALC 124 05/21/2015 1145      Imaging:  Ct Angio Chest Pe W Or Wo  Contrast  Result Date: 06/11/2016 CLINICAL DATA:  Shortness of breath.  Evaluate pulmonary embolism. EXAM: CT ANGIOGRAPHY CHEST WITH CONTRAST TECHNIQUE: Multidetector CT imaging of the chest was performed using the standard protocol during bolus administration of intravenous contrast. Multiplanar CT image reconstructions and MIPs were obtained to evaluate the vascular anatomy. CONTRAST:  100 cc Isovue 370 COMPARISON:  Nuclear medicine cardiac stress test - 06/10/2016; chest radiograph - 06/09/2016 FINDINGS: Cardiovascular: There is adequate opacification of the pulmonary artery with the main pulmonary artery measuring 287 Hounsfield units. There are no discrete filling defects within the pulmonary arterial tree to suggest presence of pulmonary embolism. Normal caliber the main pulmonary artery. Borderline cardiomegaly. Post coronary artery stent placement. Post median sternotomy. Atherosclerotic plaque within native coronary arteries. No pericardial effusion. Normal caliber of the thoracic aorta. Conventional configuration of the aortic arch. Mediastinum/Nodes: No mediastinal, hilar or axillary lymphadenopathy. Lungs/Pleura: Minimal ground-glass atelectasis about the bilateral major fissures. No discrete focal airspace opacities. No pleural effusion or pneumothorax. The central pulmonary airways appear widely patent. Mild biapical centrilobular emphysematous change. Upper Abdomen: Limited early arterial phase of the upper abdomen demonstrates a small hiatal hernia. Note is made of a punctate (approximately 0.6 cm) stone within otherwise normal-appearing gallbladder. Musculoskeletal: No acute or aggressive osseous abnormalities. Stigmata of DISH within the caudal aspect of the thoracic spine. Mild (under 25%) compression deformity involving the T3 vertebral body without associated acute fracture line. Note is made of an approximately 0.5 cm hypo attenuating nodule within the left lobe of the thyroid. Regional soft  tissues appear otherwise normal. Mild bilateral gynecomastia. Review of the MIP images confirms the above findings. IMPRESSION: 1. No acute cardiopulmonary disease - specifically, no evidence of pulmonary embolism. 2. Post median sternotomy, CABG and coronary stent placement. 3. Mild biapical centrilobular emphysematous change. Emphysema. (ICD10-J43.9) 4.  Aortic Atherosclerosis (ICD10-170.0) Electronically Signed   By: Sandi Mariscal M.D.   On: 06/11/2016 12:40   Nm Myocar Multi W/spect W/wall Motion / Ef  Result Date: 06/10/2016 CLINICAL DATA:  Chest pain EXAM: MYOCARDIAL IMAGING WITH SPECT (REST AND PHARMACOLOGIC-STRESS) GATED LEFT VENTRICULAR WALL MOTION STUDY LEFT VENTRICULAR EJECTION FRACTION TECHNIQUE: Standard myocardial SPECT imaging was performed after resting intravenous injection of 10 mCi Tc-43mtetrofosmin. Subsequently, intravenous  infusion of Lexiscan was performed under the supervision of the Cardiology staff. At peak effect of the drug, 30 mCi Tc-34mtetrofosmin was injected intravenously and standard myocardial SPECT imaging was performed. Quantitative gated imaging was also performed to evaluate left ventricular wall motion, and estimate left ventricular ejection fraction. COMPARISON:  None. FINDINGS: Perfusion: There is a large fixed perfusion defect involving the entire inferior wall, the adjacent septum, in the adjacent lateral wall. There is probably a small area of stress-induced perfusion defect at the lateral extent of the fixed defect. Wall Motion: Normal left ventricular wall motion. No left ventricular dilation. Left Ventricular Ejection Fraction: 67 % End diastolic volume 60 ml End systolic volume 20 ml IMPRESSION: 1. There is a large fixed perfusion defect in the inferior wall and adjoining walls without LV dilatation. 2. Normal left ventricular wall motion. 3. Left ventricular ejection fraction 67% 4. Non invasive risk stratification*: Low risk. Please note that there is a large  fixed perfusion defect in the inferior wall but there is no LV dilatation. *2012 Appropriate Use Criteria for Coronary Revascularization Focused Update: J Am Coll Cardiol. 21937;90(2):409-735 http://content.[email or web address]?articleid=[email or web address] Electronically Signed   By: AMarybelle KillingsM.D.   On: 06/10/2016 12:57      Assessment/Plan:   Principal Problem:   DOE (dyspnea on exertion) Active Problems:   S/P CABG x 4   Coronary atherosclerosis of native coronary artery   Hyperlipidemia   Essential hypertension   Dizziness   Chronic pain   Dyspnea on exertion  1. CAD s/p prior PCI's and Rad RX; s/p CABG in 2014;  Pt reports a history of possible vasospasm.  Ideally if BP allows consider amlodipine 2.5 mg and very low dose carvedilol.   2. Hyperlipidemia;  Reports concern that statin made his bladder cancer worse??  Will add zetia 10 mg;  Discussed possible re-trial of statin; undergoing eval for PCSK9 inhibition  3.Orthostatic hypotension:  Will continue to monitor;  Will add support stockings at 20 - 30 mmHg pressure; Not on anti-anginal RX.  Consider midodrine if orthostasis continues.  Will also try adding very los dose carvedilol at 1/2 of 3.125 initially to see if able to tolerate.   4. Chronic pain on opiates; try to decrease if tolerates  5. Sleep Apnea;  Need to re-institute cpap and obtain download from his home machine to see if pressure adjustment is necessary  Time spent: 35 minutes  TTroy Sine MD, FCambridge Behavorial Hospital10/10/2015, 10:45 AM

## 2016-06-12 NOTE — Care Management Obs Status (Signed)
[facility] NOTIFICATION   Patient Details  Name: Erik Alvarado MRN: [id number] Date of Birth: 12-22-1950   Medicare Observation Status Notification Given:  Yes    Royston Bake, RN 06/12/2016, 2:43 PM

## 2016-06-12 NOTE — Progress Notes (Signed)
ABN given to patient. Mindi Slicker [facility] [phone]

## 2016-06-13 DIAGNOSIS — M1990 Unspecified osteoarthritis, unspecified site: Secondary | ICD-10-CM | POA: Diagnosis not present

## 2016-06-13 DIAGNOSIS — I252 Old myocardial infarction: Secondary | ICD-10-CM | POA: Diagnosis not present

## 2016-06-13 DIAGNOSIS — Z87891 Personal history of nicotine dependence: Secondary | ICD-10-CM | POA: Diagnosis not present

## 2016-06-13 DIAGNOSIS — K219 Gastro-esophageal reflux disease without esophagitis: Secondary | ICD-10-CM | POA: Diagnosis not present

## 2016-06-13 DIAGNOSIS — R0609 Other forms of dyspnea: Secondary | ICD-10-CM | POA: Diagnosis not present

## 2016-06-13 DIAGNOSIS — G8929 Other chronic pain: Secondary | ICD-10-CM | POA: Diagnosis not present

## 2016-06-13 DIAGNOSIS — R42 Dizziness and giddiness: Secondary | ICD-10-CM | POA: Diagnosis not present

## 2016-06-13 DIAGNOSIS — I10 Essential (primary) hypertension: Secondary | ICD-10-CM | POA: Diagnosis not present

## 2016-06-13 DIAGNOSIS — Z951 Presence of aortocoronary bypass graft: Secondary | ICD-10-CM | POA: Diagnosis not present

## 2016-06-13 DIAGNOSIS — E785 Hyperlipidemia, unspecified: Secondary | ICD-10-CM | POA: Diagnosis not present

## 2016-06-13 DIAGNOSIS — I2510 Atherosclerotic heart disease of native coronary artery without angina pectoris: Secondary | ICD-10-CM | POA: Diagnosis not present

## 2016-06-13 DIAGNOSIS — F419 Anxiety disorder, unspecified: Secondary | ICD-10-CM | POA: Diagnosis not present

## 2016-06-13 MED ORDER — ALPRAZOLAM 0.25 MG PO TABS
0.2500 mg | ORAL_TABLET | Freq: Two times a day (BID) | ORAL | Status: DC | PRN
  Administered 2016-06-13: 0.25 mg via ORAL
  Filled 2016-06-13: qty 1

## 2016-06-13 MED ORDER — INFLUENZA VAC SPLIT QUAD 0.5 ML IM SUSY: 0.5000 mL | PREFILLED_SYRINGE | INTRAMUSCULAR | Status: DC

## 2016-06-13 MED ORDER — CARVEDILOL 3.125 MG PO TABS
3.1250 mg | ORAL_TABLET | Freq: Two times a day (BID) | ORAL | Status: DC
  Administered 2016-06-13: 3.125 mg via ORAL
  Filled 2016-06-13: qty 1

## 2016-06-13 NOTE — Progress Notes (Signed)
Pt c/o of chest heaviness after receiving increased dose of coreg, during shift report.  Pt states, "beta blockers do this to me".  VS WNL.  PA paged.
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
## 2016-06-13 NOTE — Progress Notes (Addendum)
Called to patient's bedside. Patient has chest pressure and heaviness. This started shortly after he took his  Coreg 3.125mg  . He says "this is how all beta blockers make me feel". EKG done, no changes when compared to previous EKG's, also with recent Myoview with no ischemia.   Reviewed chart, Coreg was started at low dose and increased today. Patient says that reduced dose of Coreg 1.56mg  did not make him feel badly. Patient's BP and HR stable. Likely that he will need to remain on reduced dose of 1.56mg .   Also notes that since wearing TED hose he has been able to walk around and dose not feel dizzy when he stands up.

## 2016-06-14 DIAGNOSIS — Z951 Presence of aortocoronary bypass graft: Secondary | ICD-10-CM | POA: Diagnosis not present

## 2016-06-14 DIAGNOSIS — I10 Essential (primary) hypertension: Secondary | ICD-10-CM | POA: Diagnosis not present

## 2016-06-14 DIAGNOSIS — R42 Dizziness and giddiness: Secondary | ICD-10-CM | POA: Diagnosis not present

## 2016-06-14 DIAGNOSIS — R0609 Other forms of dyspnea: Secondary | ICD-10-CM | POA: Diagnosis not present

## 2016-06-14 DIAGNOSIS — I2510 Atherosclerotic heart disease of native coronary artery without angina pectoris: Secondary | ICD-10-CM | POA: Diagnosis not present

## 2016-06-14 DIAGNOSIS — E785 Hyperlipidemia, unspecified: Secondary | ICD-10-CM | POA: Diagnosis not present

## 2016-06-14 LAB — BASIC METABOLIC PANEL
Anion gap: 6 (ref 5–15)
BUN: 16 mg/dL (ref 6–20)
CALCIUM: 8.7 mg/dL — AB (ref 8.9–10.3)
CHLORIDE: 105 mmol/L (ref 101–111)
CO2: 24 mmol/L (ref 22–32)
CREATININE: 0.98 mg/dL (ref 0.61–1.24)
GFR calc Af Amer: >60 mL/min (ref >60)
GFR calc non Af Amer: >60 mL/min (ref >60)
GLUCOSE: 104 mg/dL — AB (ref 65–99)
Potassium: 4.5 mmol/L (ref 3.5–5.1)
Sodium: 135 mmol/L (ref 135–145)

## 2016-06-14 MED ORDER — NITROGLYCERIN 0.4 MG SL SUBL: 0.4000 mg | SUBLINGUAL_TABLET | SUBLINGUAL | 12 refills | Status: DC | PRN

## 2016-06-14 MED ORDER — EZETIMIBE 10 MG PO TABS
10.0000 mg | ORAL_TABLET | Freq: Every day | ORAL | 6 refills | Status: DC
Start: 2016-06-15 — End: 2017-05-05

## 2016-06-14 MED ORDER — CARVEDILOL 3.125 MG PO TABS: 1.5600 mg | ORAL_TABLET | Freq: Two times a day (BID) | ORAL | Status: DC

## 2016-06-14 MED ORDER — CARVEDILOL 3.125 MG PO TABS: 1.5600 mg | ORAL_TABLET | Freq: Two times a day (BID) | ORAL | 6 refills | Status: DC

## 2016-06-14 MED ORDER — TAMSULOSIN HCL 0.4 MG PO CAPS: 0.4000 mg | ORAL_CAPSULE | Freq: Every day | ORAL | Status: DC
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
## 2016-06-14 NOTE — Progress Notes (Signed)
Pt has orders to be discharged. Discharge instructions given and pt has no additional questions at this time. Medication regimen reviewed and pt educated. Pt verbalized understanding and has no additional questions. Telemetry box removed. IV removed and site in good condition. Pt stable and waiting for transportation.   Sten Dematteo RN 

## 2016-06-14 NOTE — Discharge Summary (Signed)
Discharge Summary    Patient ID: Erik Alvarado,  MRN: [id number], DOB/AGE: 1950-11-08 65 y.o.  Admit date: 06/09/2016 Discharge date: 06/14/2016  Primary Care Provider: Pcp Not In System Primary Cardiologist: Dr. Stanford Breed  Discharge Diagnoses    Principal Problem:   DOE (dyspnea on exertion) Active Problems:   S/P CABG x 4   Coronary atherosclerosis of native coronary artery   Hyperlipidemia   Essential hypertension   Dizziness   Chronic pain   Dyspnea on exertion   Allergies Allergies  Allergen Reactions  . Aloe Rash  . Amoxicillin Rash  . Cephalexin Other (See Comments)    "couldn't sleep". Recently completed therapy on 09/16/13 without rash or itching.    Diagnostic Studies/Procedures    MYOCARDIAL IMAGING WITH SPECT (REST AND PHARMACOLOGIC-STRESS) 1. There is a large fixed perfusion defect in the inferior wall and adjoining walls without LV dilatation.  2. Normal left ventricular wall motion.  3. Left ventricular ejection fraction 67%  4. Non invasive risk stratification*: Low risk. Please note that there is a large fixed perfusion defect in the inferior wall but there is no LV dilatation.   History of Present Illness     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  He reports back at time of teeth extraction at [facility] there was some damage done to his rotator cuffs while under anesthesia and since that time he's had chronic pain followed by pain  management. As a result he has been unable to have an updated sleep study due to his frequent awakenings from shoulder pain. About 5 months ago he switched clinics from [place] to [facility] and his opiate regimen was reduced to 5mg  TID. Ever since that time's he's had worsening of his chronic pain which he says has led to him being much more sedentary than he's ever been. Over the last few weeks he's noticed increasing dyspnea on exertion. The last week he's had on/off dizziness as if his BP was low. It could last several hours a time, even all day long. Yesterday evening in the kitchen he became so SOB and dizzy with just simple tasks that he thought he was going to pass out. This morning he felt dizzy so he went to the pharmacy to check his BP and it was 118/70s. He also felt SOB with even minimal activity. He decided not to take his BP medication but came to the ER for further eval. He says since his BP's come up further here in the ER his dizziness feels better. He says the SOB/dizziness combo today "felt like I was having a heart attack" except that he never had chest pain this time around, which was what his prior angina felt like. Labs unremarkable except glucose 116 - BNP wnl, trop neg x1, UA wnl. CXR NAD. No LEE, orthopnea, LE erythema, syncope, bleeding. Not tachypneic, tachycardic or hypoxic. No recent long travel, surgery or bedrest.   Hospital Course     Consultants: None  Dizziness: dizziness  occurred when he was normotensive. Secondary to orthostasis.  His BP is usually high and did not felt good candidate for midodrine. Symptoms improved on 20 - 30 mm Hg pressure support stockings. Ambulated well. Orthostatic vital normal.   Dyspnea: it's possible this could represent an anginal equivalent. His stress test was normal.  However, he reports false negative tests in the past. No PE on CTA of chest. May consider outpatient pulmonary eval if ongoing problem.   CAD: Continue ASA,.  Reports concern that statin made his bladder cancer worse?? Added zetia 10 mg. Tolerated well. He tolerated low dose coreg to 1.56mg  BID however had usual chest pressure episode  with increased to coreg to 3.125. So will continue very low dose of 1.56 mg bid.  If spasm a concern as mentioned previously he may benefit from amlodipine 2.5 mg, but not started due to orthostasis prior to today.  If BP remains stable this can be tried subsequently.  Hyperlipidemia: as above. F/u OP for Repatha pending.  HTN: currently well controlled   Chronic pain: continue home regimen of oxycodone 5mg  TID.    The patient has been seen by Dr. Claiborne Billings  today and deemed ready for discharge home. All follow-up appointments have been scheduled. Discharge medications are listed below.    Discharge Vitals Blood pressure (!) 145/81, pulse 81, temperature 98.6 F (37 C), temperature source Oral, resp. rate 20, height 5\' 9"  (1.753 m), weight 186 lb 14.4 oz (84.8 kg), SpO2 99 %.  Filed Weights   06/11/16 0546 06/13/16 0533 06/14/16 0609  Weight: 187 lb 8 oz (85 kg) 185 lb (83.9 kg) 186 lb 14.4 oz (84.8 kg)    Labs & Radiologic Studies     CBC No results for input(s): WBC, NEUTROABS, HGB, HCT, MCV, PLT in the last 72 hours. Basic Metabolic Panel  Recent Labs  06/14/16 0449  NA 135  K 4.5  CL 105  CO2 24  GLUCOSE 104*  BUN 16  CREATININE 0.98  CALCIUM 8.7*   Liver Function Tests No results for input(s): AST, ALT, ALKPHOS, BILITOT, PROT, ALBUMIN in the last 72 hours. No results for input(s): LIPASE, AMYLASE in the last 72 hours. Cardiac Enzymes No results for input(s): CKTOTAL, CKMB, CKMBINDEX, TROPONINI in the last 72 hours. BNP Invalid input(s): POCBNP D-Dimer No results for input(s): DDIMER in the last 72 hours. Hemoglobin A1C No results for input(s): HGBA1C in the last 72 hours. Fasting Lipid Panel No results for input(s): CHOL, HDL, LDLCALC, TRIG, CHOLHDL, LDLDIRECT in the last 72  hours. Thyroid Function Tests No results for input(s): TSH, T4TOTAL, T3FREE, THYROIDAB in the last 72 hours.  Invalid input(s): FREET3  Dg Chest 2 View  Result Date: 06/09/2016 CLINICAL DATA:  Shortness of breath and dizziness EXAM: CHEST  2 VIEW COMPARISON:  Jan 23, 2016 FINDINGS: Lungs are clear. Heart size and pulmonary vascularity are normal. Patient is status post coronary artery bypass grafting. No adenopathy. A small metallic foreign body overlies the right upper chest. There is anterior wedging of a lower thoracic vertebral body, stable. There is no evident adenopathy. IMPRESSION: No edema or consolidation. Electronically Signed   By: Lowella Grip III M.D.   On: 06/09/2016 13:26   Ct Angio Chest Pe W Or Wo Contrast  Result Date: 06/11/2016 CLINICAL DATA:  Shortness of breath.  Evaluate pulmonary embolism. EXAM: CT ANGIOGRAPHY CHEST WITH CONTRAST TECHNIQUE: Multidetector CT imaging of the chest was performed using the standard protocol during bolus administration of intravenous contrast.  Multiplanar CT image reconstructions and MIPs were obtained to evaluate the vascular anatomy. CONTRAST:  100 cc Isovue 370 COMPARISON:  Nuclear medicine cardiac stress test - 06/10/2016; chest radiograph - 06/09/2016 FINDINGS: Cardiovascular: There is adequate opacification of the pulmonary artery with the main pulmonary artery measuring 287 Hounsfield units. There are no discrete filling defects within the pulmonary arterial tree to suggest presence of pulmonary embolism. Normal caliber the main pulmonary artery. Borderline cardiomegaly. Post coronary artery stent placement. Post median sternotomy. Atherosclerotic plaque within native coronary arteries. No pericardial effusion. Normal caliber of the thoracic aorta. Conventional configuration of the aortic arch. Mediastinum/Nodes: No mediastinal, hilar or axillary lymphadenopathy. Lungs/Pleura: Minimal ground-glass atelectasis about the bilateral major  fissures. No discrete focal airspace opacities. No pleural effusion or pneumothorax. The central pulmonary airways appear widely patent. Mild biapical centrilobular emphysematous change. Upper Abdomen: Limited early arterial phase of the upper abdomen demonstrates a small hiatal hernia. Note is made of a punctate (approximately 0.6 cm) stone within otherwise normal-appearing gallbladder. Musculoskeletal: No acute or aggressive osseous abnormalities. Stigmata of DISH within the caudal aspect of the thoracic spine. Mild (under 25%) compression deformity involving the T3 vertebral body without associated acute fracture line. Note is made of an approximately 0.5 cm hypo attenuating nodule within the left lobe of the thyroid. Regional soft tissues appear otherwise normal. Mild bilateral gynecomastia. Review of the MIP images confirms the above findings. IMPRESSION: 1. No acute cardiopulmonary disease - specifically, no evidence of pulmonary embolism. 2. Post median sternotomy, CABG and coronary stent placement. 3. Mild biapical centrilobular emphysematous change. Emphysema. (ICD10-J43.9) 4.  Aortic Atherosclerosis (ICD10-170.0) Electronically Signed   By: Sandi Mariscal M.D.   On: 06/11/2016 12:40   Nm Myocar Multi W/spect W/wall Motion / Ef  Result Date: 06/10/2016 CLINICAL DATA:  Chest pain EXAM: MYOCARDIAL IMAGING WITH SPECT (REST AND PHARMACOLOGIC-STRESS) GATED LEFT VENTRICULAR WALL MOTION STUDY LEFT VENTRICULAR EJECTION FRACTION TECHNIQUE: Standard myocardial SPECT imaging was performed after resting intravenous injection of 10 mCi Tc-60m tetrofosmin. Subsequently, intravenous infusion of Lexiscan was performed under the supervision of the Cardiology staff. At peak effect of the drug, 30 mCi Tc-69m tetrofosmin was injected intravenously and standard myocardial SPECT imaging was performed. Quantitative gated imaging was also performed to evaluate left ventricular wall motion, and estimate left ventricular ejection  fraction. COMPARISON:  None. FINDINGS: Perfusion: There is a large fixed perfusion defect involving the entire inferior wall, the adjacent septum, in the adjacent lateral wall. There is probably a small area of stress-induced perfusion defect at the lateral extent of the fixed defect. Wall Motion: Normal left ventricular wall motion. No left ventricular dilation. Left Ventricular Ejection Fraction: 67 % End diastolic volume 60 ml End systolic volume 20 ml IMPRESSION: 1. There is a large fixed perfusion defect in the inferior wall and adjoining walls without LV dilatation. 2. Normal left ventricular wall motion. 3. Left ventricular ejection fraction 67% 4. Non invasive risk stratification*: Low risk. Please note that there is a large fixed perfusion defect in the inferior wall but there is no LV dilatation. *2012 Appropriate Use Criteria for Coronary Revascularization Focused Update: J Am Coll Cardiol. N6492421. http://content.airportbarriers.com.aspx?articleid=1201161 Electronically Signed   By: Marybelle Killings M.D.   On: 06/10/2016 12:57    Disposition   Pt is being discharged home today in good condition.  Follow-up Plans & Appointments    Follow-up Information    [place], [place]. Go on 06/21/2016.   Specialties:  Cardiology, Radiology Why:  @9 :30 post  hospital  Contact information: [address] [place] [address] [phone]          Discharge Instructions    Diet - low sodium heart healthy    Complete by:  As directed    Increase activity slowly    Complete by:  As directed       Discharge Medications   Current Discharge Medication List    START taking these medications   Details  carvedilol (COREG) 3.125 MG tablet Take 0.5 tablets (1.56 mg total) by mouth 2 (two) times daily with a meal. Qty: 30 tablet, Refills: 6    ezetimibe (ZETIA) 10 MG tablet Take 1 tablet (10 mg total) by mouth daily. Qty: 30 tablet, Refills: 6    nitroGLYCERIN  (NITROSTAT) 0.4 MG SL tablet Place 1 tablet (0.4 mg total) under the tongue every 5 (five) minutes x 3 doses as needed for chest pain. Qty: 25 tablet, Refills: 12      CONTINUE these medications which have NOT CHANGED   Details  aspirin EC 81 MG tablet Take 81 mg by mouth daily.    docusate sodium (COLACE) 100 MG capsule Take 100 mg by mouth 2 (two) times daily. Equate Stool softener brand as needed  Patient last dose approximately 09/11/2013.    Omega-3 Fatty Acids (FISH OIL PO) Take 1 capsule by mouth daily.    oxyCODONE (OXY IR/ROXICODONE) 5 MG immediate release tablet Take 15 mg by mouth daily.  Refills: 0    tamsulosin (FLOMAX) 0.4 MG CAPS capsule Take 1 capsule (0.4 mg total) by mouth daily after breakfast. Qty: 30 capsule, Refills: 0    Vitamin D, Ergocalciferol, (DRISDOL) 50000 units CAPS capsule Take 1 capsule by mouth once a week. Refills: 0      STOP taking these medications     hydrochlorothiazide (MICROZIDE) 12.5 MG capsule      lisinopril (PRINIVIL,ZESTRIL) 10 MG tablet           Outstanding Labs/Studies   None  Duration of Discharge Encounter   Greater than 30 minutes including physician time.  Signed, Shantanu Strauch PA-C 06/14/2016, 3:38 PM

## 2016-06-19 ENCOUNTER — Encounter (HOSPITAL_COMMUNITY): Payer: Self-pay | Admitting: Dentistry

## 2016-06-19 ENCOUNTER — Ambulatory Visit (HOSPITAL_COMMUNITY): Payer: Medicaid - Dental | Admitting: Dentistry

## 2016-06-19 VITALS — BP 150/74 | HR 53 | Temp 98.5°F

## 2016-06-19 DIAGNOSIS — K08109 Complete loss of teeth, unspecified cause, unspecified class: Secondary | ICD-10-CM

## 2016-06-19 DIAGNOSIS — Z463 Encounter for fitting and adjustment of dental prosthetic device: Secondary | ICD-10-CM | POA: Diagnosis not present

## 2016-06-19 DIAGNOSIS — K0820 Unspecified atrophy of edentulous alveolar ridge: Secondary | ICD-10-CM

## 2016-06-19 DIAGNOSIS — Z972 Presence of dental prosthetic device (complete) (partial): Secondary | ICD-10-CM

## 2016-06-19 DIAGNOSIS — K000 Anodontia: Secondary | ICD-10-CM

## 2016-06-19 NOTE — Patient Instructions (Signed)
Plan:  1.  Patient to keep dentures out if sore spots arise. Patient to use salt water rinses as needed to aid healing. 2.   Return to clinic in 12 months for evaluation of dentures. Patient to call problems arise before then.     Lenn Cal, DDS
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
## 2016-06-21 ENCOUNTER — Telehealth: Payer: Self-pay | Admitting: Cardiology

## 2016-06-21 ENCOUNTER — Ambulatory Visit (INDEPENDENT_AMBULATORY_CARE_PROVIDER_SITE_OTHER): Payer: Medicare Other | Admitting: Cardiology

## 2016-06-21 ENCOUNTER — Encounter: Payer: Self-pay | Admitting: Cardiology

## 2016-06-21 VITALS — BP 150/84 | HR 68 | Ht 71.0 in | Wt 189.2 lb

## 2016-06-21 DIAGNOSIS — M48061 Spinal stenosis, lumbar region without neurogenic claudication: Secondary | ICD-10-CM | POA: Diagnosis not present

## 2016-06-21 DIAGNOSIS — Z951 Presence of aortocoronary bypass graft: Secondary | ICD-10-CM

## 2016-06-21 DIAGNOSIS — G8929 Other chronic pain: Secondary | ICD-10-CM

## 2016-06-21 DIAGNOSIS — R0609 Other forms of dyspnea: Secondary | ICD-10-CM

## 2016-06-21 DIAGNOSIS — I4510 Unspecified right bundle-branch block: Secondary | ICD-10-CM

## 2016-06-21 DIAGNOSIS — I10 Essential (primary) hypertension: Secondary | ICD-10-CM

## 2016-06-21 DIAGNOSIS — R0600 Dyspnea, unspecified: Secondary | ICD-10-CM

## 2016-06-21 DIAGNOSIS — I25810 Atherosclerosis of coronary artery bypass graft(s) without angina pectoris: Secondary | ICD-10-CM

## 2016-06-21 MED ORDER — AMLODIPINE BESYLATE 2.5 MG PO TABS: 2.5000 mg | ORAL_TABLET | Freq: Every day | ORAL | 6 refills | Status: DC

## 2016-06-21 NOTE — Progress Notes (Signed)
06/21/2016 Erik Alvarado   1951/03/31  [id number]  Primary Physician Pcp Not In System Primary Cardiologist: Dr Stanford Breed  HPI:   65 y/o Caucasian male originally from [place] with history of CAD (s/p multiple PCI's in [place] dating back to 1996 w/ ISR in RCA req B radiation @ one point, 2005, 2010, eventual CABG 2014 with LIMA-LAD, seq SVG-OM1-OM2, SVG-PDA). Other medical problems include RBBB, HTN, hyperlipidemia (not on statins due to possibility of them contributing to bladder CA?, awaiting Repatha approval), tobacco abuse, COPD,  bladder CA s/p resection and chemotherapy, anxiety, OSA, GERD, and chronic pain-followed at a pain clinic. He was recently admitted to the hospital with dizziness and new onset DOE. His B/P was low and his medications were adjusted. He ruled out for an MI. Myoview was low risk. He is in the office today for follow up. He continues to complain of recent onset DOE. He says he was fine a few weeks ago then suddenly noted fairly severe DOE "just walking up steps". A CTA during his recent hospitalization was negative for PE. The pt tells me he has always had negative stress test, frequently closely followed by cath and PCI. He denies chest pain or tightness but feels "something is definitely wrong".      Current Outpatient Prescriptions  Medication Sig Dispense Refill  . aspirin EC 81 MG tablet Take 81 mg by mouth daily.    . carvedilol (COREG) 3.125 MG tablet Take 0.5 tablets (1.56 mg total) by mouth 2 (two) times daily with a meal. 30 tablet 6  . docusate sodium (COLACE) 100 MG capsule Take 100 mg by mouth 2 (two) times daily. Equate Stool softener brand as needed  Patient last dose approximately 09/11/2013.    Marland Kitchen ezetimibe (ZETIA) 10 MG tablet Take 1 tablet (10 mg total) by mouth daily. 30 tablet 6  . nitroGLYCERIN (NITROSTAT) 0.4 MG SL tablet Place 1 tablet (0.4 mg total) under the tongue every 5 (five) minutes x 3 doses as needed for chest pain. 25 tablet 12  .  Omega-3 Fatty Acids (FISH OIL PO) Take 1 capsule by mouth daily.    Marland Kitchen oxyCODONE (OXY IR/ROXICODONE) 5 MG immediate release tablet Take 5 mg by mouth 3 (three) times daily.   0  . tamsulosin (FLOMAX) 0.4 MG CAPS capsule Take 1 capsule (0.4 mg total) by mouth daily after breakfast. 30 capsule 0  . amLODipine (NORVASC) 2.5 MG tablet Take 1 tablet (2.5 mg total) by mouth daily. 30 tablet 6   No current facility-administered medications for this visit.     Allergies  Allergen Reactions  . Aloe Rash  . Amoxicillin Rash    Has patient had a PCN reaction causing immediate rash, facial/tongue/throat swelling, SOB or lightheadedness with hypotension: Yes Has patient had a PCN reaction causing severe rash involving mucus membranes or skin necrosis: No Has patient had a PCN reaction that required hospitalization No Has patient had a PCN reaction occurring within the last 10 years: No If all of the above answers are "NO", then may proceed with Cephalosporin use.   . Cephalexin Other (See Comments)    "couldn't sleep". Recently completed therapy on 09/16/13 without rash or itching.    Social History   Social History  . Marital status: Single    Spouse name: N/A  . Number of children: N/A  . Years of education: N/A   Occupational History  . Not on file.   Social History Main Topics  . Smoking  status: Former Smoker    Packs/day: 1.00    Types: Cigarettes    Quit date: 08/07/2013  . Smokeless tobacco: Never Used  . Alcohol use No     Comment: patient states that he hasn't had a beer in 2014  . Drug use: No  . Sexual activity: Yes   Other Topics Concern  . Not on file   Social History Narrative   05/31/15:   Lives in [place]. Patient is divorced. Retired/disabled. Does not routinely exercise.    Family Hx: Father died at 44 of CAD  Review of Systems: General: negative for chills, fever, night sweats or weight changes.  Cardiovascular: negative for chest pain, edema, orthopnea,  palpitations, paroxysmal nocturnal dyspnea or shortness of breath Dermatological: negative for rash Respiratory: negative for cough or wheezing Urologic: negative for hematuria Abdominal: negative for nausea, vomiting, diarrhea, bright red blood per rectum, melena, or hematemesis Neurologic: negative for visual changes, syncope, or dizziness All other systems reviewed and are otherwise negative except as noted above.    Blood pressure (!) 150/84, pulse 68, height 5\' 11"  (1.803 m), weight 189 lb 3.2 oz (85.8 kg).  General appearance: alert, cooperative and no distress Neck: no carotid bruit and no JVD Lungs: clear to auscultation bilaterally Heart: regular rate and rhythm Extremities: no edema Pulses: 2+ and symmetric faint distal pulses, no Rt FA bruit noted Skin: Skin color, texture, turgor normal. No rashes or lesions Neurologic: Grossly normal  EKG NSR, RBBB  ASSESSMENT AND PLAN:   Dyspnea on exertion The pt relates a history of recent exertional dyspnea. R/O anginal equivalent.  S/P CABG x 4 History of CABG x 15 Jul 2013 after he had multiple PCI's. Recent Myoview low risk but the pt relates a history of prior false negative Myoviews preceding PCIs.   Right bundle branch block Chronic  Chronic pain Followed at a pain clinic, trying to wean down on his pain medications   PLAN  I discussed Mr Fullilove's case with Dr Stanford Breed. Given his extensive history of CAD and past false negative Myoview studies we feel its best to proceed with diagnostic cath to r/o progression of his coronary disease as a cause of his recent onset DOE. I should note his BNP on 9/29 was 44 and his EF 67% at [facility]. He did state he was trying to wean down his pain medications and asked if that may causing his symptoms. The patient understands that risks included but are not limited to stroke (1 in 1000), death (1 in 56), kidney failure [usually temporary] (1 in 500), bleeding (1 in 200), allergic  reaction [possibly serious] (1 in 200).  The patient understands and agrees to proceed.    Kerin Ransom PA-C 06/21/2016 1:37 PM

## 2016-06-21 NOTE — Telephone Encounter (Signed)
I called pt to question his oxycodone dosing. His Med Rec says he takes immediate release oxycodone 5 mg- 3 tablets once daily. I was concerned that narcotic withdrawal could be causing his symptoms. He confirms this dose and says he has been on this dose for at least 4 months. He is followed by Dr Nelva Bush. Since there has been no recent change in his pain Rx, will proceed with diagnostic cath as planned to try and explain his [place] PA-C 06/21/2016 1:59 PM

## 2016-06-21 NOTE — Assessment & Plan Note (Signed)
The pt relates a history of recent exertional dyspnea. R/O anginal equivalent.

## 2016-06-21 NOTE — Patient Instructions (Signed)
Medications:  START Amlodipine 2.5 mg daily   Follow-Up:  We will call you with follow-up plans after talking with Dr. Stanford Breed.

## 2016-06-21 NOTE — Assessment & Plan Note (Signed)
History of CABG x 15 Jul 2013 after he had multiple PCI's. Recent Myoview low risk but the pt relates a history of prior false negative Myoviews preceding PCIs.

## 2016-06-21 NOTE — Assessment & Plan Note (Signed)
Followed at a pain clinic, trying to wean down on his pain medications

## 2016-06-21 NOTE — Telephone Encounter (Signed)
Made in error

## 2016-06-21 NOTE — Assessment & Plan Note (Signed)
Chronic. 

## 2016-06-22 ENCOUNTER — Telehealth: Payer: Self-pay | Admitting: Cardiology

## 2016-06-22 NOTE — Telephone Encounter (Signed)
New message      Pt is calling to cancel cath scheduled for tomorrow.  He want to wait because he is feeling good and does not think it is necessary at this time.

## 2016-06-22 NOTE — Telephone Encounter (Signed)
Left msg for patient w recommendations.   Msg sent to scheduling to cancel cath.

## 2016-06-22 NOTE — Telephone Encounter (Signed)
I called Erik Alvarado. This is a patient of Dr. Stanford Breed w a CAD history. He has undergone cath w PCI in past.  Saw Luke in [place] for symptoms yesterday. I see that Dr. Stanford Breed was consulted for recommendations.   Patient reports that [facility] prescribed amlodipine yesterday and he feels this has helped (attributing his symptoms to vasospasms). Pt has been w/o symptoms x24 hrs. He would like to postpone/cancel cath procedure set up for tomorrow. Pt notes "I was the one pushing for doing a cath". Aware I will notify Dr. Stanford Breed of patient's request and see if there are concerns w cancelling procedure.

## 2016-06-22 NOTE — Telephone Encounter (Signed)
OK. Patient should contact us with any change in symptoms; fu with me 6-8 weeks Kirk Ruths

## 2016-06-23 ENCOUNTER — Encounter (HOSPITAL_COMMUNITY): Admission: RE | Payer: Self-pay | Source: Ambulatory Visit

## 2016-06-23 ENCOUNTER — Ambulatory Visit (HOSPITAL_COMMUNITY)
Admission: RE | Admit: 2016-06-23 | Payer: Medicare Other | Source: Ambulatory Visit | Admitting: Interventional Cardiology

## 2016-06-23 SURGERY — LEFT HEART CATH AND CORS/GRAFTS ANGIOGRAPHY
Anesthesia: LOCAL

## 2016-06-24 DIAGNOSIS — M549 Dorsalgia, unspecified: Secondary | ICD-10-CM | POA: Diagnosis not present

## 2016-06-24 DIAGNOSIS — I10 Essential (primary) hypertension: Secondary | ICD-10-CM | POA: Diagnosis not present

## 2016-06-24 DIAGNOSIS — M5136 Other intervertebral disc degeneration, lumbar region: Secondary | ICD-10-CM | POA: Diagnosis not present

## 2016-06-24 DIAGNOSIS — E785 Hyperlipidemia, unspecified: Secondary | ICD-10-CM | POA: Diagnosis not present

## 2016-07-07 DIAGNOSIS — K219 Gastro-esophageal reflux disease without esophagitis: Secondary | ICD-10-CM | POA: Diagnosis not present

## 2016-07-07 DIAGNOSIS — I214 Non-ST elevation (NSTEMI) myocardial infarction: Secondary | ICD-10-CM | POA: Diagnosis not present

## 2016-07-07 DIAGNOSIS — G8929 Other chronic pain: Secondary | ICD-10-CM | POA: Diagnosis not present

## 2016-07-07 DIAGNOSIS — I2510 Atherosclerotic heart disease of native coronary artery without angina pectoris: Secondary | ICD-10-CM | POA: Diagnosis not present

## 2016-07-07 DIAGNOSIS — Z9109 Other allergy status, other than to drugs and biological substances: Secondary | ICD-10-CM | POA: Diagnosis not present

## 2016-07-07 DIAGNOSIS — L270 Generalized skin eruption due to drugs and medicaments taken internally: Secondary | ICD-10-CM | POA: Diagnosis not present

## 2016-07-07 DIAGNOSIS — Z5329 Procedure and treatment not carried out because of patient's decision for other reasons: Secondary | ICD-10-CM | POA: Diagnosis not present

## 2016-07-07 DIAGNOSIS — R072 Precordial pain: Secondary | ICD-10-CM | POA: Diagnosis not present

## 2016-07-07 DIAGNOSIS — Z9221 Personal history of antineoplastic chemotherapy: Secondary | ICD-10-CM | POA: Diagnosis not present

## 2016-07-07 DIAGNOSIS — Z955 Presence of coronary angioplasty implant and graft: Secondary | ICD-10-CM | POA: Diagnosis not present

## 2016-07-07 DIAGNOSIS — T82898A Other specified complication of vascular prosthetic devices, implants and grafts, initial encounter: Secondary | ICD-10-CM | POA: Diagnosis not present

## 2016-07-07 DIAGNOSIS — Z906 Acquired absence of other parts of urinary tract: Secondary | ICD-10-CM | POA: Diagnosis not present

## 2016-07-07 DIAGNOSIS — Z8551 Personal history of malignant neoplasm of bladder: Secondary | ICD-10-CM | POA: Diagnosis not present

## 2016-07-07 DIAGNOSIS — T474X5A Adverse effect of other laxatives, initial encounter: Secondary | ICD-10-CM | POA: Diagnosis not present

## 2016-07-07 DIAGNOSIS — Z7982 Long term (current) use of aspirin: Secondary | ICD-10-CM | POA: Diagnosis not present

## 2016-07-07 DIAGNOSIS — I25719 Atherosclerosis of autologous vein coronary artery bypass graft(s) with unspecified angina pectoris: Secondary | ICD-10-CM | POA: Diagnosis not present

## 2016-07-07 DIAGNOSIS — E785 Hyperlipidemia, unspecified: Secondary | ICD-10-CM | POA: Diagnosis not present

## 2016-07-07 DIAGNOSIS — J449 Chronic obstructive pulmonary disease, unspecified: Secondary | ICD-10-CM | POA: Diagnosis not present

## 2016-07-07 DIAGNOSIS — Z951 Presence of aortocoronary bypass graft: Secondary | ICD-10-CM | POA: Diagnosis not present

## 2016-07-07 DIAGNOSIS — T82855A Stenosis of coronary artery stent, initial encounter: Secondary | ICD-10-CM | POA: Diagnosis not present

## 2016-07-07 DIAGNOSIS — Z888 Allergy status to other drugs, medicaments and biological substances status: Secondary | ICD-10-CM | POA: Diagnosis not present

## 2016-07-07 DIAGNOSIS — I10 Essential (primary) hypertension: Secondary | ICD-10-CM | POA: Diagnosis not present

## 2016-07-07 DIAGNOSIS — Z79899 Other long term (current) drug therapy: Secondary | ICD-10-CM | POA: Diagnosis not present

## 2016-07-07 DIAGNOSIS — I4510 Unspecified right bundle-branch block: Secondary | ICD-10-CM | POA: Diagnosis not present

## 2016-07-08 DIAGNOSIS — Z79899 Other long term (current) drug therapy: Secondary | ICD-10-CM | POA: Diagnosis not present

## 2016-07-08 DIAGNOSIS — Z888 Allergy status to other drugs, medicaments and biological substances status: Secondary | ICD-10-CM | POA: Diagnosis not present

## 2016-07-08 DIAGNOSIS — K219 Gastro-esophageal reflux disease without esophagitis: Secondary | ICD-10-CM | POA: Diagnosis not present

## 2016-07-08 DIAGNOSIS — G4733 Obstructive sleep apnea (adult) (pediatric): Secondary | ICD-10-CM | POA: Diagnosis not present

## 2016-07-08 DIAGNOSIS — Z9221 Personal history of antineoplastic chemotherapy: Secondary | ICD-10-CM | POA: Diagnosis not present

## 2016-07-08 DIAGNOSIS — G894 Chronic pain syndrome: Secondary | ICD-10-CM | POA: Diagnosis not present

## 2016-07-08 DIAGNOSIS — T82855A Stenosis of coronary artery stent, initial encounter: Secondary | ICD-10-CM | POA: Diagnosis not present

## 2016-07-08 DIAGNOSIS — Z906 Acquired absence of other parts of urinary tract: Secondary | ICD-10-CM | POA: Diagnosis not present

## 2016-07-08 DIAGNOSIS — T82898A Other specified complication of vascular prosthetic devices, implants and grafts, initial encounter: Secondary | ICD-10-CM | POA: Diagnosis not present

## 2016-07-08 DIAGNOSIS — G8929 Other chronic pain: Secondary | ICD-10-CM | POA: Diagnosis not present

## 2016-07-08 DIAGNOSIS — Z720 Tobacco use: Secondary | ICD-10-CM | POA: Diagnosis not present

## 2016-07-08 DIAGNOSIS — J449 Chronic obstructive pulmonary disease, unspecified: Secondary | ICD-10-CM | POA: Diagnosis not present

## 2016-07-08 DIAGNOSIS — Z9109 Other allergy status, other than to drugs and biological substances: Secondary | ICD-10-CM | POA: Diagnosis not present

## 2016-07-08 DIAGNOSIS — I25719 Atherosclerosis of autologous vein coronary artery bypass graft(s) with unspecified angina pectoris: Secondary | ICD-10-CM | POA: Diagnosis not present

## 2016-07-08 DIAGNOSIS — I10 Essential (primary) hypertension: Secondary | ICD-10-CM | POA: Diagnosis not present

## 2016-07-08 DIAGNOSIS — Z8551 Personal history of malignant neoplasm of bladder: Secondary | ICD-10-CM | POA: Diagnosis not present

## 2016-07-08 DIAGNOSIS — Z5329 Procedure and treatment not carried out because of patient's decision for other reasons: Secondary | ICD-10-CM | POA: Diagnosis not present

## 2016-07-08 DIAGNOSIS — I25810 Atherosclerosis of coronary artery bypass graft(s) without angina pectoris: Secondary | ICD-10-CM | POA: Diagnosis not present

## 2016-07-08 DIAGNOSIS — Z951 Presence of aortocoronary bypass graft: Secondary | ICD-10-CM | POA: Diagnosis not present

## 2016-07-08 DIAGNOSIS — L270 Generalized skin eruption due to drugs and medicaments taken internally: Secondary | ICD-10-CM | POA: Diagnosis not present

## 2016-07-08 DIAGNOSIS — Z7982 Long term (current) use of aspirin: Secondary | ICD-10-CM | POA: Diagnosis not present

## 2016-07-08 DIAGNOSIS — I4510 Unspecified right bundle-branch block: Secondary | ICD-10-CM | POA: Diagnosis not present

## 2016-07-08 DIAGNOSIS — T474X5A Adverse effect of other laxatives, initial encounter: Secondary | ICD-10-CM | POA: Diagnosis not present

## 2016-07-08 DIAGNOSIS — R072 Precordial pain: Secondary | ICD-10-CM | POA: Diagnosis not present

## 2016-07-08 DIAGNOSIS — I214 Non-ST elevation (NSTEMI) myocardial infarction: Secondary | ICD-10-CM | POA: Diagnosis not present

## 2016-07-08 DIAGNOSIS — Z955 Presence of coronary angioplasty implant and graft: Secondary | ICD-10-CM | POA: Diagnosis not present

## 2016-07-08 DIAGNOSIS — E785 Hyperlipidemia, unspecified: Secondary | ICD-10-CM | POA: Diagnosis not present

## 2016-07-08 DIAGNOSIS — C679 Malignant neoplasm of bladder, unspecified: Secondary | ICD-10-CM | POA: Diagnosis not present

## 2016-07-09 DIAGNOSIS — T82898A Other specified complication of vascular prosthetic devices, implants and grafts, initial encounter: Secondary | ICD-10-CM | POA: Diagnosis not present

## 2016-07-09 DIAGNOSIS — Z720 Tobacco use: Secondary | ICD-10-CM | POA: Diagnosis not present

## 2016-07-09 DIAGNOSIS — I214 Non-ST elevation (NSTEMI) myocardial infarction: Secondary | ICD-10-CM | POA: Diagnosis not present

## 2016-07-09 DIAGNOSIS — G894 Chronic pain syndrome: Secondary | ICD-10-CM | POA: Diagnosis not present

## 2016-07-09 DIAGNOSIS — T82855A Stenosis of coronary artery stent, initial encounter: Secondary | ICD-10-CM | POA: Diagnosis not present

## 2016-07-09 DIAGNOSIS — Z8551 Personal history of malignant neoplasm of bladder: Secondary | ICD-10-CM | POA: Diagnosis not present

## 2016-07-09 DIAGNOSIS — R001 Bradycardia, unspecified: Secondary | ICD-10-CM | POA: Diagnosis not present

## 2016-07-09 DIAGNOSIS — J449 Chronic obstructive pulmonary disease, unspecified: Secondary | ICD-10-CM | POA: Diagnosis not present

## 2016-07-09 DIAGNOSIS — Z955 Presence of coronary angioplasty implant and graft: Secondary | ICD-10-CM | POA: Diagnosis not present

## 2016-07-09 DIAGNOSIS — I4510 Unspecified right bundle-branch block: Secondary | ICD-10-CM | POA: Diagnosis not present

## 2016-07-09 DIAGNOSIS — C679 Malignant neoplasm of bladder, unspecified: Secondary | ICD-10-CM | POA: Diagnosis not present

## 2016-07-09 DIAGNOSIS — K219 Gastro-esophageal reflux disease without esophagitis: Secondary | ICD-10-CM | POA: Diagnosis not present

## 2016-07-09 DIAGNOSIS — G4733 Obstructive sleep apnea (adult) (pediatric): Secondary | ICD-10-CM | POA: Diagnosis not present

## 2016-07-09 DIAGNOSIS — Z951 Presence of aortocoronary bypass graft: Secondary | ICD-10-CM | POA: Diagnosis not present

## 2016-07-09 DIAGNOSIS — I25719 Atherosclerosis of autologous vein coronary artery bypass graft(s) with unspecified angina pectoris: Secondary | ICD-10-CM | POA: Diagnosis not present

## 2016-07-09 DIAGNOSIS — I25810 Atherosclerosis of coronary artery bypass graft(s) without angina pectoris: Secondary | ICD-10-CM | POA: Diagnosis not present

## 2016-07-09 DIAGNOSIS — Z9889 Other specified postprocedural states: Secondary | ICD-10-CM | POA: Diagnosis not present

## 2016-07-09 DIAGNOSIS — Z9221 Personal history of antineoplastic chemotherapy: Secondary | ICD-10-CM | POA: Diagnosis not present

## 2016-07-09 DIAGNOSIS — E785 Hyperlipidemia, unspecified: Secondary | ICD-10-CM | POA: Diagnosis not present

## 2016-07-09 DIAGNOSIS — I10 Essential (primary) hypertension: Secondary | ICD-10-CM | POA: Diagnosis not present

## 2016-07-10 DIAGNOSIS — J449 Chronic obstructive pulmonary disease, unspecified: Secondary | ICD-10-CM | POA: Diagnosis not present

## 2016-07-10 DIAGNOSIS — T82898A Other specified complication of vascular prosthetic devices, implants and grafts, initial encounter: Secondary | ICD-10-CM | POA: Diagnosis not present

## 2016-07-10 DIAGNOSIS — I10 Essential (primary) hypertension: Secondary | ICD-10-CM | POA: Diagnosis not present

## 2016-07-10 DIAGNOSIS — I2582 Chronic total occlusion of coronary artery: Secondary | ICD-10-CM | POA: Diagnosis not present

## 2016-07-10 DIAGNOSIS — I214 Non-ST elevation (NSTEMI) myocardial infarction: Secondary | ICD-10-CM | POA: Diagnosis not present

## 2016-07-10 DIAGNOSIS — I25119 Atherosclerotic heart disease of native coronary artery with unspecified angina pectoris: Secondary | ICD-10-CM | POA: Diagnosis not present

## 2016-07-10 DIAGNOSIS — I25810 Atherosclerosis of coronary artery bypass graft(s) without angina pectoris: Secondary | ICD-10-CM | POA: Diagnosis not present

## 2016-07-10 DIAGNOSIS — T82855A Stenosis of coronary artery stent, initial encounter: Secondary | ICD-10-CM | POA: Diagnosis not present

## 2016-07-10 DIAGNOSIS — Z955 Presence of coronary angioplasty implant and graft: Secondary | ICD-10-CM | POA: Diagnosis not present

## 2016-07-10 DIAGNOSIS — I25719 Atherosclerosis of autologous vein coronary artery bypass graft(s) with unspecified angina pectoris: Secondary | ICD-10-CM | POA: Diagnosis not present

## 2016-07-10 DIAGNOSIS — Z951 Presence of aortocoronary bypass graft: Secondary | ICD-10-CM | POA: Diagnosis not present

## 2016-07-11 DIAGNOSIS — I10 Essential (primary) hypertension: Secondary | ICD-10-CM | POA: Diagnosis not present

## 2016-07-11 DIAGNOSIS — Z8551 Personal history of malignant neoplasm of bladder: Secondary | ICD-10-CM | POA: Diagnosis not present

## 2016-07-11 DIAGNOSIS — I2510 Atherosclerotic heart disease of native coronary artery without angina pectoris: Secondary | ICD-10-CM | POA: Diagnosis not present

## 2016-07-11 DIAGNOSIS — T82898A Other specified complication of vascular prosthetic devices, implants and grafts, initial encounter: Secondary | ICD-10-CM | POA: Diagnosis not present

## 2016-07-11 DIAGNOSIS — I25719 Atherosclerosis of autologous vein coronary artery bypass graft(s) with unspecified angina pectoris: Secondary | ICD-10-CM | POA: Diagnosis not present

## 2016-07-11 DIAGNOSIS — K219 Gastro-esophageal reflux disease without esophagitis: Secondary | ICD-10-CM | POA: Diagnosis not present

## 2016-07-11 DIAGNOSIS — I214 Non-ST elevation (NSTEMI) myocardial infarction: Secondary | ICD-10-CM | POA: Diagnosis not present

## 2016-07-11 DIAGNOSIS — J449 Chronic obstructive pulmonary disease, unspecified: Secondary | ICD-10-CM | POA: Diagnosis not present

## 2016-07-11 DIAGNOSIS — E785 Hyperlipidemia, unspecified: Secondary | ICD-10-CM | POA: Diagnosis not present

## 2016-07-11 DIAGNOSIS — Z9221 Personal history of antineoplastic chemotherapy: Secondary | ICD-10-CM | POA: Diagnosis not present

## 2016-07-11 DIAGNOSIS — G4733 Obstructive sleep apnea (adult) (pediatric): Secondary | ICD-10-CM | POA: Diagnosis not present

## 2016-07-11 DIAGNOSIS — T82855A Stenosis of coronary artery stent, initial encounter: Secondary | ICD-10-CM | POA: Diagnosis not present

## 2016-07-11 DIAGNOSIS — Z720 Tobacco use: Secondary | ICD-10-CM | POA: Diagnosis not present

## 2016-07-11 DIAGNOSIS — G894 Chronic pain syndrome: Secondary | ICD-10-CM | POA: Diagnosis not present

## 2016-07-11 DIAGNOSIS — Z955 Presence of coronary angioplasty implant and graft: Secondary | ICD-10-CM | POA: Diagnosis not present

## 2016-07-19 DIAGNOSIS — Z7982 Long term (current) use of aspirin: Secondary | ICD-10-CM | POA: Diagnosis not present

## 2016-07-19 DIAGNOSIS — I4510 Unspecified right bundle-branch block: Secondary | ICD-10-CM | POA: Diagnosis not present

## 2016-07-19 DIAGNOSIS — Z951 Presence of aortocoronary bypass graft: Secondary | ICD-10-CM | POA: Diagnosis not present

## 2016-07-19 DIAGNOSIS — Z955 Presence of coronary angioplasty implant and graft: Secondary | ICD-10-CM | POA: Diagnosis not present

## 2016-07-19 DIAGNOSIS — I2510 Atherosclerotic heart disease of native coronary artery without angina pectoris: Secondary | ICD-10-CM | POA: Diagnosis not present

## 2016-07-19 DIAGNOSIS — I10 Essential (primary) hypertension: Secondary | ICD-10-CM | POA: Diagnosis not present

## 2016-07-19 DIAGNOSIS — Z5189 Encounter for other specified aftercare: Secondary | ICD-10-CM | POA: Diagnosis not present

## 2016-07-19 DIAGNOSIS — I252 Old myocardial infarction: Secondary | ICD-10-CM | POA: Diagnosis not present

## 2016-07-19 DIAGNOSIS — Z7901 Long term (current) use of anticoagulants: Secondary | ICD-10-CM | POA: Diagnosis not present

## 2016-07-24 DIAGNOSIS — M48061 Spinal stenosis, lumbar region without neurogenic claudication: Secondary | ICD-10-CM | POA: Diagnosis not present

## 2016-07-31 ENCOUNTER — Telehealth (HOSPITAL_COMMUNITY): Payer: Self-pay | Admitting: Cardiac Rehabilitation

## 2016-07-31 NOTE — Telephone Encounter (Signed)
pc to pt to discuss enrolling in cardiac rehab. Awaiting medicaid order from Dr. Erline Levine.  Form refaxed to the office.  Pt given information about program and expresses interest.  Will return call to pt once form received.

## 2016-09-07 DIAGNOSIS — Z91048 Other nonmedicinal substance allergy status: Secondary | ICD-10-CM | POA: Diagnosis not present

## 2016-09-07 DIAGNOSIS — Z881 Allergy status to other antibiotic agents status: Secondary | ICD-10-CM | POA: Diagnosis not present

## 2016-09-07 DIAGNOSIS — Z951 Presence of aortocoronary bypass graft: Secondary | ICD-10-CM | POA: Diagnosis not present

## 2016-09-07 DIAGNOSIS — Z87891 Personal history of nicotine dependence: Secondary | ICD-10-CM | POA: Diagnosis not present

## 2016-09-07 DIAGNOSIS — Z880 Allergy status to penicillin: Secondary | ICD-10-CM | POA: Diagnosis not present

## 2016-09-07 DIAGNOSIS — Z888 Allergy status to other drugs, medicaments and biological substances status: Secondary | ICD-10-CM | POA: Diagnosis not present

## 2016-09-07 DIAGNOSIS — I259 Chronic ischemic heart disease, unspecified: Secondary | ICD-10-CM | POA: Diagnosis not present

## 2016-09-07 DIAGNOSIS — I2510 Atherosclerotic heart disease of native coronary artery without angina pectoris: Secondary | ICD-10-CM | POA: Diagnosis not present

## 2016-09-12 ENCOUNTER — Telehealth (HOSPITAL_COMMUNITY): Payer: Self-pay | Admitting: Cardiac Rehabilitation

## 2016-09-12 ENCOUNTER — Inpatient Hospital Stay (HOSPITAL_COMMUNITY): Admission: RE | Admit: 2016-09-12 | Payer: Self-pay | Source: Ambulatory Visit

## 2016-09-12 NOTE — Telephone Encounter (Signed)
pc to pt to confirm cardiac rehab orientation.  Pt cancelled appointment and would like to call back to reschedule.

## 2016-09-13 DIAGNOSIS — E7800 Pure hypercholesterolemia, unspecified: Secondary | ICD-10-CM | POA: Diagnosis not present

## 2016-09-13 DIAGNOSIS — I10 Essential (primary) hypertension: Secondary | ICD-10-CM | POA: Diagnosis not present

## 2016-09-13 DIAGNOSIS — Z880 Allergy status to penicillin: Secondary | ICD-10-CM | POA: Diagnosis not present

## 2016-09-13 DIAGNOSIS — Z7982 Long term (current) use of aspirin: Secondary | ICD-10-CM | POA: Diagnosis not present

## 2016-09-13 DIAGNOSIS — J449 Chronic obstructive pulmonary disease, unspecified: Secondary | ICD-10-CM | POA: Diagnosis not present

## 2016-09-13 DIAGNOSIS — Z8551 Personal history of malignant neoplasm of bladder: Secondary | ICD-10-CM | POA: Diagnosis not present

## 2016-09-13 DIAGNOSIS — Z888 Allergy status to other drugs, medicaments and biological substances status: Secondary | ICD-10-CM | POA: Diagnosis not present

## 2016-09-13 DIAGNOSIS — I252 Old myocardial infarction: Secondary | ICD-10-CM | POA: Diagnosis not present

## 2016-09-13 DIAGNOSIS — Z79891 Long term (current) use of opiate analgesic: Secondary | ICD-10-CM | POA: Diagnosis not present

## 2016-09-13 DIAGNOSIS — Z881 Allergy status to other antibiotic agents status: Secondary | ICD-10-CM | POA: Diagnosis not present

## 2016-09-13 DIAGNOSIS — Z87891 Personal history of nicotine dependence: Secondary | ICD-10-CM | POA: Diagnosis not present

## 2016-09-13 DIAGNOSIS — Z91018 Allergy to other foods: Secondary | ICD-10-CM | POA: Diagnosis not present

## 2016-09-13 DIAGNOSIS — I2510 Atherosclerotic heart disease of native coronary artery without angina pectoris: Secondary | ICD-10-CM | POA: Diagnosis not present

## 2016-09-18 ENCOUNTER — Ambulatory Visit (HOSPITAL_COMMUNITY): Payer: Self-pay

## 2016-09-20 ENCOUNTER — Ambulatory Visit (HOSPITAL_COMMUNITY): Payer: Self-pay

## 2016-09-22 ENCOUNTER — Ambulatory Visit (HOSPITAL_COMMUNITY): Payer: Self-pay

## 2016-09-25 ENCOUNTER — Ambulatory Visit (HOSPITAL_COMMUNITY): Payer: Self-pay

## 2016-09-27 ENCOUNTER — Ambulatory Visit (HOSPITAL_COMMUNITY): Payer: Self-pay

## 2016-09-29 ENCOUNTER — Ambulatory Visit (HOSPITAL_COMMUNITY): Payer: Self-pay

## 2016-10-02 ENCOUNTER — Ambulatory Visit (HOSPITAL_COMMUNITY): Payer: Self-pay

## 2016-10-02 DIAGNOSIS — G894 Chronic pain syndrome: Secondary | ICD-10-CM | POA: Diagnosis not present

## 2016-10-02 DIAGNOSIS — M48061 Spinal stenosis, lumbar region without neurogenic claudication: Secondary | ICD-10-CM | POA: Diagnosis not present

## 2016-10-04 ENCOUNTER — Ambulatory Visit (HOSPITAL_COMMUNITY): Payer: Self-pay

## 2016-10-06 ENCOUNTER — Ambulatory Visit (HOSPITAL_COMMUNITY): Payer: Self-pay

## 2016-10-09 ENCOUNTER — Ambulatory Visit (HOSPITAL_COMMUNITY): Payer: Self-pay

## 2016-10-11 ENCOUNTER — Ambulatory Visit (HOSPITAL_COMMUNITY): Payer: Self-pay

## 2016-10-13 ENCOUNTER — Ambulatory Visit (HOSPITAL_COMMUNITY): Payer: Self-pay

## 2016-10-16 ENCOUNTER — Ambulatory Visit (HOSPITAL_COMMUNITY): Payer: Self-pay

## 2016-10-17 DIAGNOSIS — M21611 Bunion of right foot: Secondary | ICD-10-CM | POA: Diagnosis not present

## 2016-10-17 DIAGNOSIS — M2011 Hallux valgus (acquired), right foot: Secondary | ICD-10-CM | POA: Diagnosis not present

## 2016-10-18 ENCOUNTER — Ambulatory Visit (HOSPITAL_COMMUNITY): Payer: Self-pay

## 2016-10-19 DIAGNOSIS — Z955 Presence of coronary angioplasty implant and graft: Secondary | ICD-10-CM | POA: Diagnosis not present

## 2016-10-19 DIAGNOSIS — Z881 Allergy status to other antibiotic agents status: Secondary | ICD-10-CM | POA: Diagnosis not present

## 2016-10-19 DIAGNOSIS — E785 Hyperlipidemia, unspecified: Secondary | ICD-10-CM | POA: Diagnosis not present

## 2016-10-19 DIAGNOSIS — Z880 Allergy status to penicillin: Secondary | ICD-10-CM | POA: Diagnosis not present

## 2016-10-19 DIAGNOSIS — G8929 Other chronic pain: Secondary | ICD-10-CM | POA: Diagnosis not present

## 2016-10-19 DIAGNOSIS — J432 Centrilobular emphysema: Secondary | ICD-10-CM | POA: Diagnosis not present

## 2016-10-19 DIAGNOSIS — I10 Essential (primary) hypertension: Secondary | ICD-10-CM | POA: Diagnosis not present

## 2016-10-19 DIAGNOSIS — H5711 Ocular pain, right eye: Secondary | ICD-10-CM | POA: Diagnosis not present

## 2016-10-19 DIAGNOSIS — I2510 Atherosclerotic heart disease of native coronary artery without angina pectoris: Secondary | ICD-10-CM | POA: Diagnosis not present

## 2016-10-19 DIAGNOSIS — Z888 Allergy status to other drugs, medicaments and biological substances status: Secondary | ICD-10-CM | POA: Diagnosis not present

## 2016-10-19 DIAGNOSIS — Z951 Presence of aortocoronary bypass graft: Secondary | ICD-10-CM | POA: Diagnosis not present

## 2016-10-19 DIAGNOSIS — Z8551 Personal history of malignant neoplasm of bladder: Secondary | ICD-10-CM | POA: Diagnosis not present

## 2016-10-19 DIAGNOSIS — Z7982 Long term (current) use of aspirin: Secondary | ICD-10-CM | POA: Diagnosis not present

## 2016-10-19 DIAGNOSIS — I119 Hypertensive heart disease without heart failure: Secondary | ICD-10-CM | POA: Diagnosis not present

## 2016-10-19 DIAGNOSIS — Z79899 Other long term (current) drug therapy: Secondary | ICD-10-CM | POA: Diagnosis not present

## 2016-10-19 DIAGNOSIS — I25810 Atherosclerosis of coronary artery bypass graft(s) without angina pectoris: Secondary | ICD-10-CM | POA: Diagnosis not present

## 2016-10-19 DIAGNOSIS — Z9889 Other specified postprocedural states: Secondary | ICD-10-CM | POA: Diagnosis not present

## 2016-10-19 DIAGNOSIS — Z906 Acquired absence of other parts of urinary tract: Secondary | ICD-10-CM | POA: Diagnosis not present

## 2016-10-19 DIAGNOSIS — Z95828 Presence of other vascular implants and grafts: Secondary | ICD-10-CM | POA: Diagnosis not present

## 2016-10-19 DIAGNOSIS — Z87891 Personal history of nicotine dependence: Secondary | ICD-10-CM | POA: Diagnosis not present

## 2016-10-19 DIAGNOSIS — I252 Old myocardial infarction: Secondary | ICD-10-CM | POA: Diagnosis not present

## 2016-10-19 DIAGNOSIS — J439 Emphysema, unspecified: Secondary | ICD-10-CM | POA: Diagnosis not present

## 2016-10-20 ENCOUNTER — Ambulatory Visit (HOSPITAL_COMMUNITY): Payer: Self-pay

## 2016-10-23 ENCOUNTER — Ambulatory Visit (HOSPITAL_COMMUNITY): Payer: Self-pay

## 2016-10-25 ENCOUNTER — Ambulatory Visit (HOSPITAL_COMMUNITY): Payer: Self-pay

## 2016-10-27 ENCOUNTER — Ambulatory Visit (HOSPITAL_COMMUNITY): Payer: Self-pay

## 2016-10-30 ENCOUNTER — Ambulatory Visit (HOSPITAL_COMMUNITY): Payer: Self-pay

## 2016-10-31 DIAGNOSIS — H11823 Conjunctivochalasis, bilateral: Secondary | ICD-10-CM | POA: Diagnosis not present

## 2016-10-31 DIAGNOSIS — H527 Unspecified disorder of refraction: Secondary | ICD-10-CM | POA: Diagnosis not present

## 2016-10-31 DIAGNOSIS — H52223 Regular astigmatism, bilateral: Secondary | ICD-10-CM | POA: Diagnosis not present

## 2016-11-01 ENCOUNTER — Ambulatory Visit (HOSPITAL_COMMUNITY): Payer: Self-pay

## 2016-11-03 ENCOUNTER — Ambulatory Visit (HOSPITAL_COMMUNITY): Payer: Self-pay

## 2016-11-06 ENCOUNTER — Ambulatory Visit (HOSPITAL_COMMUNITY): Payer: Self-pay

## 2016-11-08 ENCOUNTER — Ambulatory Visit (HOSPITAL_COMMUNITY): Payer: Self-pay

## 2016-11-10 ENCOUNTER — Ambulatory Visit (HOSPITAL_COMMUNITY): Payer: Self-pay

## 2016-11-10 DIAGNOSIS — M48061 Spinal stenosis, lumbar region without neurogenic claudication: Secondary | ICD-10-CM | POA: Diagnosis not present

## 2016-11-10 DIAGNOSIS — G894 Chronic pain syndrome: Secondary | ICD-10-CM | POA: Diagnosis not present

## 2016-11-13 ENCOUNTER — Ambulatory Visit (HOSPITAL_COMMUNITY): Payer: Self-pay

## 2016-11-14 DIAGNOSIS — Z9114 Patient's other noncompliance with medication regimen: Secondary | ICD-10-CM | POA: Diagnosis not present

## 2016-11-14 DIAGNOSIS — Z7982 Long term (current) use of aspirin: Secondary | ICD-10-CM | POA: Diagnosis not present

## 2016-11-14 DIAGNOSIS — I5030 Unspecified diastolic (congestive) heart failure: Secondary | ICD-10-CM | POA: Diagnosis not present

## 2016-11-14 DIAGNOSIS — Z7902 Long term (current) use of antithrombotics/antiplatelets: Secondary | ICD-10-CM | POA: Diagnosis not present

## 2016-11-14 DIAGNOSIS — Z87891 Personal history of nicotine dependence: Secondary | ICD-10-CM | POA: Diagnosis not present

## 2016-11-14 DIAGNOSIS — M549 Dorsalgia, unspecified: Secondary | ICD-10-CM | POA: Diagnosis not present

## 2016-11-14 DIAGNOSIS — I252 Old myocardial infarction: Secondary | ICD-10-CM | POA: Diagnosis not present

## 2016-11-14 DIAGNOSIS — I10 Essential (primary) hypertension: Secondary | ICD-10-CM | POA: Diagnosis not present

## 2016-11-14 DIAGNOSIS — I110 Hypertensive heart disease with heart failure: Secondary | ICD-10-CM | POA: Diagnosis not present

## 2016-11-14 DIAGNOSIS — Z9889 Other specified postprocedural states: Secondary | ICD-10-CM | POA: Diagnosis not present

## 2016-11-14 DIAGNOSIS — Z9221 Personal history of antineoplastic chemotherapy: Secondary | ICD-10-CM | POA: Diagnosis not present

## 2016-11-14 DIAGNOSIS — E041 Nontoxic single thyroid nodule: Secondary | ICD-10-CM | POA: Diagnosis not present

## 2016-11-14 DIAGNOSIS — Z79899 Other long term (current) drug therapy: Secondary | ICD-10-CM | POA: Diagnosis not present

## 2016-11-14 DIAGNOSIS — E785 Hyperlipidemia, unspecified: Secondary | ICD-10-CM | POA: Diagnosis not present

## 2016-11-14 DIAGNOSIS — Z0000 Encounter for general adult medical examination without abnormal findings: Secondary | ICD-10-CM | POA: Diagnosis not present

## 2016-11-14 DIAGNOSIS — Z955 Presence of coronary angioplasty implant and graft: Secondary | ICD-10-CM | POA: Diagnosis not present

## 2016-11-14 DIAGNOSIS — I2510 Atherosclerotic heart disease of native coronary artery without angina pectoris: Secondary | ICD-10-CM | POA: Diagnosis not present

## 2016-11-14 DIAGNOSIS — Z951 Presence of aortocoronary bypass graft: Secondary | ICD-10-CM | POA: Diagnosis not present

## 2016-11-14 DIAGNOSIS — I25810 Atherosclerosis of coronary artery bypass graft(s) without angina pectoris: Secondary | ICD-10-CM | POA: Diagnosis not present

## 2016-11-14 DIAGNOSIS — M79671 Pain in right foot: Secondary | ICD-10-CM | POA: Diagnosis not present

## 2016-11-14 DIAGNOSIS — Z8551 Personal history of malignant neoplasm of bladder: Secondary | ICD-10-CM | POA: Diagnosis not present

## 2016-11-14 DIAGNOSIS — J449 Chronic obstructive pulmonary disease, unspecified: Secondary | ICD-10-CM | POA: Diagnosis not present

## 2016-11-15 ENCOUNTER — Ambulatory Visit (HOSPITAL_COMMUNITY): Payer: Self-pay

## 2016-11-15 DIAGNOSIS — C679 Malignant neoplasm of bladder, unspecified: Secondary | ICD-10-CM | POA: Diagnosis not present

## 2016-11-17 ENCOUNTER — Ambulatory Visit (HOSPITAL_COMMUNITY): Payer: Self-pay

## 2016-11-20 ENCOUNTER — Ambulatory Visit (HOSPITAL_COMMUNITY): Payer: Self-pay

## 2016-11-22 ENCOUNTER — Ambulatory Visit (HOSPITAL_COMMUNITY): Payer: Self-pay

## 2016-11-24 ENCOUNTER — Ambulatory Visit (HOSPITAL_COMMUNITY): Payer: Self-pay

## 2016-11-27 ENCOUNTER — Ambulatory Visit (HOSPITAL_COMMUNITY): Payer: Self-pay

## 2016-11-29 ENCOUNTER — Ambulatory Visit (HOSPITAL_COMMUNITY): Payer: Self-pay

## 2016-12-01 ENCOUNTER — Ambulatory Visit (HOSPITAL_COMMUNITY): Payer: Self-pay

## 2016-12-04 ENCOUNTER — Ambulatory Visit (HOSPITAL_COMMUNITY): Payer: Self-pay

## 2016-12-06 ENCOUNTER — Ambulatory Visit (HOSPITAL_COMMUNITY): Payer: Self-pay

## 2016-12-08 ENCOUNTER — Ambulatory Visit (HOSPITAL_COMMUNITY): Payer: Self-pay

## 2016-12-11 ENCOUNTER — Ambulatory Visit (HOSPITAL_COMMUNITY): Payer: Self-pay

## 2016-12-13 ENCOUNTER — Ambulatory Visit (HOSPITAL_COMMUNITY): Payer: Self-pay

## 2016-12-15 ENCOUNTER — Ambulatory Visit (HOSPITAL_COMMUNITY): Payer: Self-pay

## 2016-12-18 ENCOUNTER — Ambulatory Visit (HOSPITAL_COMMUNITY): Payer: Self-pay

## 2016-12-20 ENCOUNTER — Ambulatory Visit (HOSPITAL_COMMUNITY): Payer: Self-pay

## 2016-12-22 ENCOUNTER — Ambulatory Visit (HOSPITAL_COMMUNITY): Payer: Self-pay

## 2016-12-25 ENCOUNTER — Ambulatory Visit (HOSPITAL_COMMUNITY): Payer: Self-pay

## 2016-12-27 ENCOUNTER — Ambulatory Visit (HOSPITAL_COMMUNITY): Payer: Self-pay

## 2016-12-29 ENCOUNTER — Ambulatory Visit (HOSPITAL_COMMUNITY): Payer: Self-pay

## 2017-01-01 ENCOUNTER — Ambulatory Visit (HOSPITAL_COMMUNITY): Payer: Self-pay

## 2017-01-03 ENCOUNTER — Ambulatory Visit (HOSPITAL_COMMUNITY): Payer: Self-pay

## 2017-01-05 ENCOUNTER — Ambulatory Visit (HOSPITAL_COMMUNITY): Payer: Self-pay

## 2017-01-08 ENCOUNTER — Ambulatory Visit (HOSPITAL_COMMUNITY): Payer: Self-pay

## 2017-01-10 ENCOUNTER — Ambulatory Visit (HOSPITAL_COMMUNITY): Payer: Self-pay

## 2017-01-11 DIAGNOSIS — Z09 Encounter for follow-up examination after completed treatment for conditions other than malignant neoplasm: Secondary | ICD-10-CM | POA: Diagnosis not present

## 2017-01-11 DIAGNOSIS — I2510 Atherosclerotic heart disease of native coronary artery without angina pectoris: Secondary | ICD-10-CM | POA: Diagnosis not present

## 2017-01-11 DIAGNOSIS — I10 Essential (primary) hypertension: Secondary | ICD-10-CM | POA: Diagnosis not present

## 2017-01-11 DIAGNOSIS — I252 Old myocardial infarction: Secondary | ICD-10-CM | POA: Diagnosis not present

## 2017-01-11 DIAGNOSIS — Z91018 Allergy to other foods: Secondary | ICD-10-CM | POA: Diagnosis not present

## 2017-01-11 DIAGNOSIS — E785 Hyperlipidemia, unspecified: Secondary | ICD-10-CM | POA: Diagnosis not present

## 2017-01-11 DIAGNOSIS — Z881 Allergy status to other antibiotic agents status: Secondary | ICD-10-CM | POA: Diagnosis not present

## 2017-01-11 DIAGNOSIS — J449 Chronic obstructive pulmonary disease, unspecified: Secondary | ICD-10-CM | POA: Diagnosis not present

## 2017-01-11 DIAGNOSIS — Z79899 Other long term (current) drug therapy: Secondary | ICD-10-CM | POA: Diagnosis not present

## 2017-01-11 DIAGNOSIS — Z888 Allergy status to other drugs, medicaments and biological substances status: Secondary | ICD-10-CM | POA: Diagnosis not present

## 2017-01-11 DIAGNOSIS — E7800 Pure hypercholesterolemia, unspecified: Secondary | ICD-10-CM | POA: Diagnosis not present

## 2017-01-11 DIAGNOSIS — Z8551 Personal history of malignant neoplasm of bladder: Secondary | ICD-10-CM | POA: Diagnosis not present

## 2017-01-11 DIAGNOSIS — Z87891 Personal history of nicotine dependence: Secondary | ICD-10-CM | POA: Diagnosis not present

## 2017-01-11 DIAGNOSIS — Z7982 Long term (current) use of aspirin: Secondary | ICD-10-CM | POA: Diagnosis not present

## 2017-01-12 ENCOUNTER — Ambulatory Visit (HOSPITAL_COMMUNITY): Payer: Self-pay

## 2017-01-15 ENCOUNTER — Ambulatory Visit (HOSPITAL_COMMUNITY): Payer: Self-pay

## 2017-01-23 DIAGNOSIS — Z91018 Allergy to other foods: Secondary | ICD-10-CM | POA: Diagnosis not present

## 2017-01-23 DIAGNOSIS — I10 Essential (primary) hypertension: Secondary | ICD-10-CM | POA: Diagnosis not present

## 2017-01-23 DIAGNOSIS — E041 Nontoxic single thyroid nodule: Secondary | ICD-10-CM | POA: Diagnosis not present

## 2017-01-23 DIAGNOSIS — M549 Dorsalgia, unspecified: Secondary | ICD-10-CM | POA: Diagnosis not present

## 2017-01-23 DIAGNOSIS — C689 Malignant neoplasm of urinary organ, unspecified: Secondary | ICD-10-CM | POA: Diagnosis not present

## 2017-01-23 DIAGNOSIS — E663 Overweight: Secondary | ICD-10-CM | POA: Diagnosis not present

## 2017-01-23 DIAGNOSIS — M25511 Pain in right shoulder: Secondary | ICD-10-CM | POA: Diagnosis not present

## 2017-01-23 DIAGNOSIS — Z9181 History of falling: Secondary | ICD-10-CM | POA: Diagnosis not present

## 2017-01-23 DIAGNOSIS — Z1389 Encounter for screening for other disorder: Secondary | ICD-10-CM | POA: Diagnosis not present

## 2017-01-23 DIAGNOSIS — Z6826 Body mass index (BMI) 26.0-26.9, adult: Secondary | ICD-10-CM | POA: Diagnosis not present

## 2017-01-23 DIAGNOSIS — I2510 Atherosclerotic heart disease of native coronary artery without angina pectoris: Secondary | ICD-10-CM | POA: Diagnosis not present

## 2017-01-23 DIAGNOSIS — E785 Hyperlipidemia, unspecified: Secondary | ICD-10-CM | POA: Diagnosis not present

## 2017-02-14 DIAGNOSIS — E041 Nontoxic single thyroid nodule: Secondary | ICD-10-CM | POA: Diagnosis not present

## 2017-02-26 DIAGNOSIS — M7541 Impingement syndrome of right shoulder: Secondary | ICD-10-CM | POA: Diagnosis not present

## 2017-02-26 DIAGNOSIS — G8929 Other chronic pain: Secondary | ICD-10-CM | POA: Diagnosis not present

## 2017-02-26 DIAGNOSIS — M25511 Pain in right shoulder: Secondary | ICD-10-CM | POA: Diagnosis not present

## 2017-03-01 DIAGNOSIS — Z1389 Encounter for screening for other disorder: Secondary | ICD-10-CM | POA: Diagnosis not present

## 2017-03-01 DIAGNOSIS — Z6826 Body mass index (BMI) 26.0-26.9, adult: Secondary | ICD-10-CM | POA: Diagnosis not present

## 2017-03-01 DIAGNOSIS — E041 Nontoxic single thyroid nodule: Secondary | ICD-10-CM | POA: Diagnosis not present

## 2017-03-01 DIAGNOSIS — I6529 Occlusion and stenosis of unspecified carotid artery: Secondary | ICD-10-CM | POA: Diagnosis not present

## 2017-03-09 DIAGNOSIS — I6523 Occlusion and stenosis of bilateral carotid arteries: Secondary | ICD-10-CM | POA: Diagnosis not present

## 2017-03-15 DIAGNOSIS — Z79891 Long term (current) use of opiate analgesic: Secondary | ICD-10-CM | POA: Diagnosis not present

## 2017-03-15 DIAGNOSIS — M545 Low back pain: Secondary | ICD-10-CM | POA: Diagnosis not present

## 2017-03-15 DIAGNOSIS — M5136 Other intervertebral disc degeneration, lumbar region: Secondary | ICD-10-CM | POA: Diagnosis not present

## 2017-03-15 DIAGNOSIS — G894 Chronic pain syndrome: Secondary | ICD-10-CM | POA: Diagnosis not present

## 2017-03-21 DIAGNOSIS — N401 Enlarged prostate with lower urinary tract symptoms: Secondary | ICD-10-CM | POA: Diagnosis not present

## 2017-03-21 DIAGNOSIS — R351 Nocturia: Secondary | ICD-10-CM | POA: Diagnosis not present

## 2017-03-21 DIAGNOSIS — C679 Malignant neoplasm of bladder, unspecified: Secondary | ICD-10-CM | POA: Diagnosis not present

## 2017-05-03 DIAGNOSIS — I259 Chronic ischemic heart disease, unspecified: Secondary | ICD-10-CM | POA: Diagnosis not present

## 2017-05-03 DIAGNOSIS — Z9861 Coronary angioplasty status: Secondary | ICD-10-CM | POA: Diagnosis not present

## 2017-05-03 DIAGNOSIS — I2510 Atherosclerotic heart disease of native coronary artery without angina pectoris: Secondary | ICD-10-CM | POA: Diagnosis not present

## 2017-05-03 DIAGNOSIS — Z951 Presence of aortocoronary bypass graft: Secondary | ICD-10-CM | POA: Diagnosis not present

## 2017-05-05 ENCOUNTER — Other Ambulatory Visit: Payer: Self-pay | Admitting: Physician Assistant

## 2017-05-07 NOTE — Telephone Encounter (Signed)
Rx request sent to pharmacy.  

## 2017-05-18 DIAGNOSIS — I517 Cardiomegaly: Secondary | ICD-10-CM | POA: Diagnosis not present

## 2017-05-18 DIAGNOSIS — I259 Chronic ischemic heart disease, unspecified: Secondary | ICD-10-CM | POA: Diagnosis not present

## 2017-05-18 DIAGNOSIS — I361 Nonrheumatic tricuspid (valve) insufficiency: Secondary | ICD-10-CM | POA: Diagnosis not present

## 2017-05-18 DIAGNOSIS — I340 Nonrheumatic mitral (valve) insufficiency: Secondary | ICD-10-CM | POA: Diagnosis not present

## 2017-06-03 ENCOUNTER — Other Ambulatory Visit: Payer: Self-pay | Admitting: Physician Assistant

## 2017-06-05 NOTE — Telephone Encounter (Signed)
This is Dr. Crenshaw's pt 

## 2017-07-02 ENCOUNTER — Other Ambulatory Visit: Payer: Self-pay | Admitting: Cardiology

## 2017-07-12 DIAGNOSIS — G894 Chronic pain syndrome: Secondary | ICD-10-CM | POA: Diagnosis not present

## 2017-07-26 DIAGNOSIS — I2510 Atherosclerotic heart disease of native coronary artery without angina pectoris: Secondary | ICD-10-CM | POA: Diagnosis not present

## 2017-07-26 DIAGNOSIS — E559 Vitamin D deficiency, unspecified: Secondary | ICD-10-CM | POA: Diagnosis not present

## 2017-07-26 DIAGNOSIS — Z79899 Other long term (current) drug therapy: Secondary | ICD-10-CM | POA: Diagnosis not present

## 2017-07-26 DIAGNOSIS — I6529 Occlusion and stenosis of unspecified carotid artery: Secondary | ICD-10-CM | POA: Diagnosis not present

## 2017-07-26 DIAGNOSIS — C689 Malignant neoplasm of urinary organ, unspecified: Secondary | ICD-10-CM | POA: Diagnosis not present

## 2017-07-26 DIAGNOSIS — I10 Essential (primary) hypertension: Secondary | ICD-10-CM | POA: Diagnosis not present

## 2017-07-26 DIAGNOSIS — Z1331 Encounter for screening for depression: Secondary | ICD-10-CM | POA: Diagnosis not present

## 2017-07-26 DIAGNOSIS — R609 Edema, unspecified: Secondary | ICD-10-CM | POA: Diagnosis not present

## 2017-07-26 DIAGNOSIS — Z23 Encounter for immunization: Secondary | ICD-10-CM | POA: Diagnosis not present

## 2017-07-26 DIAGNOSIS — Z91018 Allergy to other foods: Secondary | ICD-10-CM | POA: Diagnosis not present

## 2017-07-26 DIAGNOSIS — E785 Hyperlipidemia, unspecified: Secondary | ICD-10-CM | POA: Diagnosis not present

## 2017-07-30 DIAGNOSIS — Z79899 Other long term (current) drug therapy: Secondary | ICD-10-CM | POA: Diagnosis not present

## 2017-07-30 DIAGNOSIS — E559 Vitamin D deficiency, unspecified: Secondary | ICD-10-CM | POA: Diagnosis not present

## 2017-07-30 DIAGNOSIS — E785 Hyperlipidemia, unspecified: Secondary | ICD-10-CM | POA: Diagnosis not present

## 2017-08-12 DIAGNOSIS — S60452A Superficial foreign body of right middle finger, initial encounter: Secondary | ICD-10-CM | POA: Diagnosis not present

## 2017-08-29 DIAGNOSIS — S62609A Fracture of unspecified phalanx of unspecified finger, initial encounter for closed fracture: Secondary | ICD-10-CM | POA: Diagnosis not present

## 2017-08-29 DIAGNOSIS — S61001A Unspecified open wound of right thumb without damage to nail, initial encounter: Secondary | ICD-10-CM | POA: Diagnosis not present

## 2017-08-30 DIAGNOSIS — Z6826 Body mass index (BMI) 26.0-26.9, adult: Secondary | ICD-10-CM | POA: Diagnosis not present

## 2017-08-30 DIAGNOSIS — S62201B Unspecified fracture of first metacarpal bone, right hand, initial encounter for open fracture: Secondary | ICD-10-CM | POA: Diagnosis not present

## 2017-09-01 ENCOUNTER — Emergency Department (HOSPITAL_COMMUNITY)
Admission: EM | Admit: 2017-09-01 | Discharge: 2017-09-01 | Disposition: A | Discharge status: Home / Self Care | Payer: Medicare Other | Attending: Emergency Medicine | Admitting: Emergency Medicine

## 2017-09-01 ENCOUNTER — Encounter (HOSPITAL_COMMUNITY): Payer: Self-pay

## 2017-09-01 ENCOUNTER — Emergency Department (HOSPITAL_COMMUNITY)
Admission: EM | Admit: 2017-09-01 | Discharge: 2017-09-02 | Disposition: A | Discharge status: Home / Self Care | Payer: Medicare Other | Source: Home / Self Care

## 2017-09-01 DIAGNOSIS — Z5321 Procedure and treatment not carried out due to patient leaving prior to being seen by health care provider: Secondary | ICD-10-CM | POA: Insufficient documentation

## 2017-09-01 DIAGNOSIS — R58 Hemorrhage, not elsewhere classified: Secondary | ICD-10-CM | POA: Insufficient documentation

## 2017-09-01 NOTE — ED Triage Notes (Signed)
Pt called again for triage with no answer

## 2017-09-01 NOTE — ED Notes (Signed)
Called pt's name a second time to be triaged, but no response from the waiting room.

## 2017-09-01 NOTE — ED Notes (Signed)
Called pt's name to be triaged, but no response from the waiting room. 

## 2017-09-01 NOTE — ED Triage Notes (Signed)
Pt cut his left hand at his thumb with a knife cutting an apple, bleeding controlled with pressure

## 2017-09-02 DIAGNOSIS — S61412A Laceration without foreign body of left hand, initial encounter: Secondary | ICD-10-CM | POA: Diagnosis not present

## 2017-09-02 DIAGNOSIS — S61012A Laceration without foreign body of left thumb without damage to nail, initial encounter: Secondary | ICD-10-CM | POA: Diagnosis not present

## 2017-09-02 NOTE — ED Notes (Signed)
Patient left AMA reports that he was waiting to long in triage.

## 2017-09-06 DIAGNOSIS — S61012A Laceration without foreign body of left thumb without damage to nail, initial encounter: Secondary | ICD-10-CM | POA: Diagnosis not present

## 2017-09-06 DIAGNOSIS — S62524A Nondisplaced fracture of distal phalanx of right thumb, initial encounter for closed fracture: Secondary | ICD-10-CM | POA: Diagnosis not present

## 2017-09-06 HISTORY — DX: Nondisplaced fracture of distal phalanx of right thumb, initial encounter for closed fracture: S62.524A

## 2017-09-06 HISTORY — DX: Laceration without foreign body of left thumb without damage to nail, initial encounter: S61.012A

## 2017-09-14 DIAGNOSIS — E663 Overweight: Secondary | ICD-10-CM | POA: Diagnosis not present

## 2017-09-14 DIAGNOSIS — Z6826 Body mass index (BMI) 26.0-26.9, adult: Secondary | ICD-10-CM | POA: Diagnosis not present

## 2017-09-14 DIAGNOSIS — S61412A Laceration without foreign body of left hand, initial encounter: Secondary | ICD-10-CM | POA: Diagnosis not present

## 2017-09-21 DIAGNOSIS — C679 Malignant neoplasm of bladder, unspecified: Secondary | ICD-10-CM | POA: Diagnosis not present

## 2017-09-21 DIAGNOSIS — N401 Enlarged prostate with lower urinary tract symptoms: Secondary | ICD-10-CM | POA: Diagnosis not present

## 2017-09-21 DIAGNOSIS — Z125 Encounter for screening for malignant neoplasm of prostate: Secondary | ICD-10-CM | POA: Diagnosis not present

## 2017-10-05 ENCOUNTER — Other Ambulatory Visit: Payer: Self-pay | Admitting: Cardiology

## 2017-10-08 NOTE — Telephone Encounter (Signed)
Rx(s) sent to pharmacy electronically.  

## 2017-11-08 DIAGNOSIS — M5136 Other intervertebral disc degeneration, lumbar region: Secondary | ICD-10-CM | POA: Diagnosis not present

## 2017-11-08 DIAGNOSIS — Z79891 Long term (current) use of opiate analgesic: Secondary | ICD-10-CM | POA: Diagnosis not present

## 2017-11-08 DIAGNOSIS — M545 Low back pain: Secondary | ICD-10-CM | POA: Diagnosis not present

## 2017-11-13 DIAGNOSIS — R310 Gross hematuria: Secondary | ICD-10-CM | POA: Diagnosis not present

## 2017-11-16 DIAGNOSIS — K8020 Calculus of gallbladder without cholecystitis without obstruction: Secondary | ICD-10-CM | POA: Diagnosis not present

## 2017-11-16 DIAGNOSIS — R310 Gross hematuria: Secondary | ICD-10-CM | POA: Diagnosis not present

## 2017-11-26 DIAGNOSIS — R531 Weakness: Secondary | ICD-10-CM | POA: Diagnosis not present

## 2017-11-26 DIAGNOSIS — Z6826 Body mass index (BMI) 26.0-26.9, adult: Secondary | ICD-10-CM | POA: Diagnosis not present

## 2017-11-26 DIAGNOSIS — R319 Hematuria, unspecified: Secondary | ICD-10-CM | POA: Diagnosis not present

## 2017-11-26 DIAGNOSIS — I2510 Atherosclerotic heart disease of native coronary artery without angina pectoris: Secondary | ICD-10-CM | POA: Diagnosis not present

## 2017-11-26 DIAGNOSIS — E785 Hyperlipidemia, unspecified: Secondary | ICD-10-CM | POA: Diagnosis not present

## 2017-11-26 DIAGNOSIS — C689 Malignant neoplasm of urinary organ, unspecified: Secondary | ICD-10-CM | POA: Diagnosis not present

## 2017-11-26 DIAGNOSIS — K862 Cyst of pancreas: Secondary | ICD-10-CM | POA: Diagnosis not present

## 2017-11-27 DIAGNOSIS — R310 Gross hematuria: Secondary | ICD-10-CM | POA: Diagnosis not present

## 2017-11-27 DIAGNOSIS — N401 Enlarged prostate with lower urinary tract symptoms: Secondary | ICD-10-CM | POA: Diagnosis not present

## 2017-11-27 DIAGNOSIS — R972 Elevated prostate specific antigen [PSA]: Secondary | ICD-10-CM | POA: Diagnosis not present

## 2017-12-10 DIAGNOSIS — Z6826 Body mass index (BMI) 26.0-26.9, adult: Secondary | ICD-10-CM | POA: Diagnosis not present

## 2017-12-10 DIAGNOSIS — I2510 Atherosclerotic heart disease of native coronary artery without angina pectoris: Secondary | ICD-10-CM | POA: Diagnosis not present

## 2017-12-10 DIAGNOSIS — R5383 Other fatigue: Secondary | ICD-10-CM | POA: Diagnosis not present

## 2017-12-10 DIAGNOSIS — G4733 Obstructive sleep apnea (adult) (pediatric): Secondary | ICD-10-CM | POA: Diagnosis not present

## 2017-12-10 DIAGNOSIS — I10 Essential (primary) hypertension: Secondary | ICD-10-CM | POA: Diagnosis not present

## 2017-12-24 DIAGNOSIS — Z136 Encounter for screening for cardiovascular disorders: Secondary | ICD-10-CM | POA: Diagnosis not present

## 2017-12-24 DIAGNOSIS — Z125 Encounter for screening for malignant neoplasm of prostate: Secondary | ICD-10-CM | POA: Diagnosis not present

## 2017-12-24 DIAGNOSIS — E785 Hyperlipidemia, unspecified: Secondary | ICD-10-CM | POA: Diagnosis not present

## 2017-12-24 DIAGNOSIS — Z0000 Encounter for general adult medical examination without abnormal findings: Secondary | ICD-10-CM | POA: Diagnosis not present

## 2017-12-24 DIAGNOSIS — Z139 Encounter for screening, unspecified: Secondary | ICD-10-CM | POA: Diagnosis not present

## 2017-12-24 DIAGNOSIS — Z9181 History of falling: Secondary | ICD-10-CM | POA: Diagnosis not present

## 2017-12-25 DIAGNOSIS — J984 Other disorders of lung: Secondary | ICD-10-CM | POA: Diagnosis not present

## 2017-12-25 DIAGNOSIS — G4733 Obstructive sleep apnea (adult) (pediatric): Secondary | ICD-10-CM | POA: Diagnosis not present

## 2017-12-25 DIAGNOSIS — R5383 Other fatigue: Secondary | ICD-10-CM | POA: Diagnosis not present

## 2018-01-17 DIAGNOSIS — A932 Colorado tick fever: Secondary | ICD-10-CM | POA: Diagnosis not present

## 2018-01-17 DIAGNOSIS — R21 Rash and other nonspecific skin eruption: Secondary | ICD-10-CM | POA: Diagnosis not present

## 2018-01-17 DIAGNOSIS — W57XXXA Bitten or stung by nonvenomous insect and other nonvenomous arthropods, initial encounter: Secondary | ICD-10-CM | POA: Diagnosis not present

## 2018-01-19 DIAGNOSIS — G4733 Obstructive sleep apnea (adult) (pediatric): Secondary | ICD-10-CM | POA: Diagnosis not present

## 2018-01-23 DIAGNOSIS — I2510 Atherosclerotic heart disease of native coronary artery without angina pectoris: Secondary | ICD-10-CM | POA: Diagnosis not present

## 2018-01-23 DIAGNOSIS — J449 Chronic obstructive pulmonary disease, unspecified: Secondary | ICD-10-CM | POA: Diagnosis not present

## 2018-01-23 DIAGNOSIS — R5383 Other fatigue: Secondary | ICD-10-CM | POA: Diagnosis not present

## 2018-01-23 DIAGNOSIS — G4733 Obstructive sleep apnea (adult) (pediatric): Secondary | ICD-10-CM | POA: Diagnosis not present

## 2018-01-23 DIAGNOSIS — J984 Other disorders of lung: Secondary | ICD-10-CM | POA: Diagnosis not present

## 2018-01-23 DIAGNOSIS — E785 Hyperlipidemia, unspecified: Secondary | ICD-10-CM | POA: Diagnosis not present

## 2018-01-23 DIAGNOSIS — I10 Essential (primary) hypertension: Secondary | ICD-10-CM | POA: Diagnosis not present

## 2018-01-23 DIAGNOSIS — Z79899 Other long term (current) drug therapy: Secondary | ICD-10-CM | POA: Diagnosis not present

## 2018-01-23 DIAGNOSIS — Z23 Encounter for immunization: Secondary | ICD-10-CM | POA: Diagnosis not present

## 2018-02-07 DIAGNOSIS — Z79891 Long term (current) use of opiate analgesic: Secondary | ICD-10-CM | POA: Diagnosis not present

## 2018-02-07 DIAGNOSIS — M545 Low back pain: Secondary | ICD-10-CM | POA: Diagnosis not present

## 2018-02-11 DIAGNOSIS — R21 Rash and other nonspecific skin eruption: Secondary | ICD-10-CM | POA: Diagnosis not present

## 2018-02-11 DIAGNOSIS — S30861A Insect bite (nonvenomous) of abdominal wall, initial encounter: Secondary | ICD-10-CM | POA: Diagnosis not present

## 2018-02-11 DIAGNOSIS — M2550 Pain in unspecified joint: Secondary | ICD-10-CM | POA: Diagnosis not present

## 2018-02-27 DIAGNOSIS — R972 Elevated prostate specific antigen [PSA]: Secondary | ICD-10-CM | POA: Diagnosis not present

## 2018-02-27 DIAGNOSIS — C679 Malignant neoplasm of bladder, unspecified: Secondary | ICD-10-CM | POA: Diagnosis not present

## 2018-02-27 DIAGNOSIS — N401 Enlarged prostate with lower urinary tract symptoms: Secondary | ICD-10-CM | POA: Diagnosis not present

## 2018-03-13 DIAGNOSIS — G4733 Obstructive sleep apnea (adult) (pediatric): Secondary | ICD-10-CM | POA: Diagnosis not present

## 2018-03-13 DIAGNOSIS — J4520 Mild intermittent asthma, uncomplicated: Secondary | ICD-10-CM | POA: Diagnosis not present

## 2018-03-13 DIAGNOSIS — R5383 Other fatigue: Secondary | ICD-10-CM | POA: Diagnosis not present

## 2018-03-17 DIAGNOSIS — G4733 Obstructive sleep apnea (adult) (pediatric): Secondary | ICD-10-CM | POA: Diagnosis not present

## 2018-04-10 DIAGNOSIS — G4733 Obstructive sleep apnea (adult) (pediatric): Secondary | ICD-10-CM | POA: Diagnosis not present

## 2018-05-24 DIAGNOSIS — J4520 Mild intermittent asthma, uncomplicated: Secondary | ICD-10-CM | POA: Diagnosis not present

## 2018-05-30 DIAGNOSIS — N401 Enlarged prostate with lower urinary tract symptoms: Secondary | ICD-10-CM | POA: Diagnosis not present

## 2018-05-30 DIAGNOSIS — R972 Elevated prostate specific antigen [PSA]: Secondary | ICD-10-CM | POA: Diagnosis not present

## 2018-05-30 DIAGNOSIS — C679 Malignant neoplasm of bladder, unspecified: Secondary | ICD-10-CM | POA: Diagnosis not present

## 2018-06-06 DIAGNOSIS — G894 Chronic pain syndrome: Secondary | ICD-10-CM | POA: Diagnosis not present

## 2018-06-14 DIAGNOSIS — G4733 Obstructive sleep apnea (adult) (pediatric): Secondary | ICD-10-CM | POA: Diagnosis not present

## 2018-06-14 DIAGNOSIS — J4520 Mild intermittent asthma, uncomplicated: Secondary | ICD-10-CM | POA: Diagnosis not present

## 2018-06-14 DIAGNOSIS — R5383 Other fatigue: Secondary | ICD-10-CM | POA: Diagnosis not present

## 2018-06-14 DIAGNOSIS — Z23 Encounter for immunization: Secondary | ICD-10-CM | POA: Diagnosis not present

## 2018-07-12 DIAGNOSIS — Z955 Presence of coronary angioplasty implant and graft: Secondary | ICD-10-CM | POA: Diagnosis not present

## 2018-07-12 DIAGNOSIS — I10 Essential (primary) hypertension: Secondary | ICD-10-CM | POA: Diagnosis not present

## 2018-07-12 DIAGNOSIS — I4510 Unspecified right bundle-branch block: Secondary | ICD-10-CM | POA: Diagnosis not present

## 2018-07-12 DIAGNOSIS — Z79899 Other long term (current) drug therapy: Secondary | ICD-10-CM | POA: Diagnosis not present

## 2018-07-12 DIAGNOSIS — Z8551 Personal history of malignant neoplasm of bladder: Secondary | ICD-10-CM | POA: Diagnosis not present

## 2018-07-12 DIAGNOSIS — Z87891 Personal history of nicotine dependence: Secondary | ICD-10-CM | POA: Diagnosis not present

## 2018-07-12 DIAGNOSIS — Z7982 Long term (current) use of aspirin: Secondary | ICD-10-CM | POA: Diagnosis not present

## 2018-07-12 DIAGNOSIS — I2510 Atherosclerotic heart disease of native coronary artery without angina pectoris: Secondary | ICD-10-CM | POA: Diagnosis not present

## 2018-07-12 DIAGNOSIS — I252 Old myocardial infarction: Secondary | ICD-10-CM | POA: Diagnosis not present

## 2018-07-12 DIAGNOSIS — J449 Chronic obstructive pulmonary disease, unspecified: Secondary | ICD-10-CM | POA: Diagnosis not present

## 2018-07-12 DIAGNOSIS — Z951 Presence of aortocoronary bypass graft: Secondary | ICD-10-CM | POA: Diagnosis not present

## 2018-07-29 DIAGNOSIS — I10 Essential (primary) hypertension: Secondary | ICD-10-CM | POA: Diagnosis not present

## 2018-07-29 DIAGNOSIS — Z79899 Other long term (current) drug therapy: Secondary | ICD-10-CM | POA: Diagnosis not present

## 2018-07-29 DIAGNOSIS — I2510 Atherosclerotic heart disease of native coronary artery without angina pectoris: Secondary | ICD-10-CM | POA: Diagnosis not present

## 2018-07-29 DIAGNOSIS — E785 Hyperlipidemia, unspecified: Secondary | ICD-10-CM | POA: Diagnosis not present

## 2018-07-29 DIAGNOSIS — R1011 Right upper quadrant pain: Secondary | ICD-10-CM | POA: Diagnosis not present

## 2018-08-02 ENCOUNTER — Encounter: Payer: Self-pay | Admitting: Gastroenterology

## 2018-08-06 DIAGNOSIS — K8080 Other cholelithiasis without obstruction: Secondary | ICD-10-CM | POA: Diagnosis not present

## 2018-08-06 DIAGNOSIS — K862 Cyst of pancreas: Secondary | ICD-10-CM | POA: Diagnosis not present

## 2018-08-06 DIAGNOSIS — R1011 Right upper quadrant pain: Secondary | ICD-10-CM | POA: Diagnosis not present

## 2018-08-06 DIAGNOSIS — K8020 Calculus of gallbladder without cholecystitis without obstruction: Secondary | ICD-10-CM | POA: Diagnosis not present

## 2018-08-15 DIAGNOSIS — N281 Cyst of kidney, acquired: Secondary | ICD-10-CM | POA: Diagnosis not present

## 2018-08-15 DIAGNOSIS — K7689 Other specified diseases of liver: Secondary | ICD-10-CM | POA: Diagnosis not present

## 2018-08-15 DIAGNOSIS — K862 Cyst of pancreas: Secondary | ICD-10-CM | POA: Diagnosis not present

## 2018-09-05 ENCOUNTER — Ambulatory Visit (INDEPENDENT_AMBULATORY_CARE_PROVIDER_SITE_OTHER): Payer: Medicare Other | Admitting: Gastroenterology

## 2018-09-05 ENCOUNTER — Encounter: Payer: Self-pay | Admitting: Gastroenterology

## 2018-09-05 VITALS — BP 144/78 | HR 70 | Ht 72.0 in | Wt 197.1 lb

## 2018-09-05 DIAGNOSIS — K219 Gastro-esophageal reflux disease without esophagitis: Secondary | ICD-10-CM

## 2018-09-05 DIAGNOSIS — R1011 Right upper quadrant pain: Secondary | ICD-10-CM

## 2018-09-05 MED ORDER — PANTOPRAZOLE SODIUM 40 MG PO TBEC: 40.0000 mg | DELAYED_RELEASE_TABLET | Freq: Every day | ORAL | 11 refills | Status: DC
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
## 2018-09-05 NOTE — Patient Instructions (Signed)
If you are age 69 or older, your body mass index should be between 23-30. Your Body mass index is 26.73 kg/m. If this is out of the aforementioned range listed, please consider follow up with your Primary Care Provider.  If you are age 42 or younger, your body mass index should be between 19-25. Your Body mass index is 26.73 kg/m. If this is out of the aformentioned range listed, please consider follow up with your Primary Care Provider.   You have been scheduled for an endoscopy. Please follow written instructions given to you at your visit today. If you use inhalers (even only as needed), please bring them with you on the day of your procedure. Your physician has requested that you go to www.startemmi.com and enter the access code given to you at your visit today. This web site gives a general overview about your procedure. However, you should still follow specific instructions given to you by our office regarding your preparation for the procedure.  We have sent the following medications to your pharmacy for you to pick up at your convenience: Protonix once daily  Please purchase the following medications over the counter and take as directed: Zantac at bedtime   Use a heating pad and Bengay as needed for pain.  You will be contacted by our office prior to your procedure for directions on holding your Plavix.  If you do not hear from our office 1 week prior to your scheduled procedure, please call [phone] to discuss.   Thank you,  Dr. Jackquline Denmark

## 2018-09-17 ENCOUNTER — Telehealth: Payer: Self-pay | Admitting: *Deleted

## 2018-09-17 ENCOUNTER — Other Ambulatory Visit: Payer: Self-pay | Admitting: *Deleted

## 2018-09-17 ENCOUNTER — Telehealth: Payer: Self-pay

## 2018-09-17 NOTE — Telephone Encounter (Signed)
Opened in error

## 2018-09-17 NOTE — Telephone Encounter (Signed)
Per Dr. Erline Levine it is okay for patient to hold Plavix for 5 days before his procedure. Patient called and advised.

## 2018-09-19 ENCOUNTER — Encounter: Payer: Self-pay | Admitting: Gastroenterology

## 2018-10-07 DIAGNOSIS — M545 Low back pain: Secondary | ICD-10-CM | POA: Diagnosis not present

## 2018-10-07 DIAGNOSIS — G894 Chronic pain syndrome: Secondary | ICD-10-CM | POA: Diagnosis not present

## 2018-10-10 ENCOUNTER — Ambulatory Visit (AMBULATORY_SURGERY_CENTER): Payer: Medicare Other | Admitting: Gastroenterology

## 2018-10-10 ENCOUNTER — Encounter: Payer: Self-pay | Admitting: Gastroenterology

## 2018-10-10 VITALS — BP 119/68 | HR 50 | Temp 97.3°F | Resp 14 | Ht 72.0 in | Wt 197.0 lb

## 2018-10-10 DIAGNOSIS — B9681 Helicobacter pylori [H. pylori] as the cause of diseases classified elsewhere: Secondary | ICD-10-CM | POA: Diagnosis not present

## 2018-10-10 DIAGNOSIS — K219 Gastro-esophageal reflux disease without esophagitis: Secondary | ICD-10-CM | POA: Diagnosis not present

## 2018-10-10 DIAGNOSIS — K2970 Gastritis, unspecified, without bleeding: Secondary | ICD-10-CM | POA: Diagnosis not present

## 2018-10-10 DIAGNOSIS — R1011 Right upper quadrant pain: Secondary | ICD-10-CM

## 2018-10-10 DIAGNOSIS — K2950 Unspecified chronic gastritis without bleeding: Secondary | ICD-10-CM | POA: Diagnosis not present

## 2018-10-10 MED ORDER — SODIUM CHLORIDE 0.9 % IV SOLN: 500.0000 mL | Freq: Once | INTRAVENOUS | Status: DC

## 2018-10-10 MED ORDER — PANTOPRAZOLE SODIUM 40 MG PO TBEC: 40.0000 mg | DELAYED_RELEASE_TABLET | Freq: Two times a day (BID) | ORAL | 2 refills | Status: DC

## 2018-10-10 NOTE — Progress Notes (Signed)
Called to room to assist during endoscopic procedure.  Patient ID and intended procedure confirmed with present staff. Received instructions for my participation in the procedure from the performing physician.  

## 2018-10-10 NOTE — Progress Notes (Signed)
To PACU, VSS. Report to [email or web address] 

## 2018-10-10 NOTE — Op Note (Addendum)
[place] Patient Name: Erik Alvarado Procedure Date: 10/10/2018 9:53 AM MRN: [id number] Endoscopist: Jackquline Denmark , MD Age: 68 Referring MD:  Date of Birth: 08/15/1951 Gender: Male Account #: [id number] Procedure:                Upper GI endoscopy Indications:              Abdominal pain in the right upper quadrant, GERD Medicines:                Monitored Anesthesia Care Procedure:                Pre-Anesthesia Assessment:                           - Prior to the procedure, a History and Physical                            was performed, and patient medications and                            allergies were reviewed. The patient's tolerance of                            previous anesthesia was also reviewed. The risks                            and benefits of the procedure and the sedation                            options and risks were discussed with the patient.                            All questions were answered, and informed consent                            was obtained. Prior Anticoagulants: The patient has                            taken Plavix (clopidogrel), last dose was 5 days                            prior to procedure. ASA Grade Assessment: III - A                            patient with severe systemic disease. After                            reviewing the risks and benefits, the patient was                            deemed in satisfactory condition to undergo the                            procedure.  After obtaining informed consent, the endoscope was                            passed under direct vision. Throughout the                            procedure, the patient's blood pressure, pulse, and                            oxygen saturations were monitored continuously. The                            Endoscope was introduced through the mouth, and                            advanced to the second part of duodenum. The  upper                            GI endoscopy was accomplished without difficulty.                            The patient tolerated the procedure well. Scope In: Scope Out: Findings:                 The examined esophagus was normal.                           The Z-line was regular and was found 36 cm from the                            incisors.                           A 2 cm hiatal hernia was present with two small 4                            mm [place] erosions at the diaphragmatic hiatus                            (biopsied).                           Localized mild inflammation characterized by                            erythema was found in the gastric antrum. Biopsies                            were taken with a cold forceps for histology.                            Estimated blood loss: none.                           A single 4 mm no bleeding angioectasia was  found in                            the gastric body.                           The examined duodenum was normal. Complications:            No immediate complications. Estimated Blood Loss:     Estimated blood loss: none. Impression:               - Small hiatal hernia with small [place] erosions..                           - Mild gastritis.                           - A single non-bleeding angioectasia in the stomach. Recommendation:           - Patient has a contact number available for                            emergencies. The signs and symptoms of potential                            delayed complications were discussed with the                            patient. Return to normal activities tomorrow.                            Written discharge instructions were provided to the                            patient.                           - Resume previous diet.                           - Increase Protonix to 40 mg p.o. twice daily, #60,                            2 refills. Continue Zantac 150 milligrams  p.o.                            nightly until follow-up visit.                           - Await pathology results.                           - Resume Plavix (clopidogrel) at prior dose                            tomorrow.                           -  Return to GI clinic in 6 weeks. If still with                            problems, will perform further work-up.                           - Brochures regarding GERD.                           -Addendum: I have discussed in detail with the                            patient and patient's family. Since he continues to                            have right upper quadrant abdominal pain, has                            gallstones-could have symptomatic gallbladder                            disease. We would get surgical opinion for possible                            lap chole if needed. Jackquline Denmark, MD 10/10/2018 10:16:50 AM This report has been signed electronically.

## 2018-10-10 NOTE — Patient Instructions (Signed)
Please read handouts provided. Resume Plavix at prior dose tomorrow. Increase Protonix to 40 mg twice daily. Return to GI clinic in 6 weeks,\. Continue Zantac 150 mg nightly until follow-up visit.    YOU HAD AN ENDOSCOPIC PROCEDURE TODAY AT [facility]:   Refer to the procedure report that was given to you for any specific questions about what was found during the examination.  If the procedure report does not answer your questions, please call your gastroenterologist to clarify.  If you requested that your care partner not be given the details of your procedure findings, then the procedure report has been included in a sealed envelope for you to review at your convenience later.  YOU SHOULD EXPECT: Some feelings of bloating in the abdomen. Passage of more gas than usual.  Walking can help get rid of the air that was put into your GI tract during the procedure and reduce the bloating. If you had a lower endoscopy (such as a colonoscopy or flexible sigmoidoscopy) you may notice spotting of blood in your stool or on the toilet paper. If you underwent a bowel prep for your procedure, you may not have a normal bowel movement for a few days.  Please Note:  You might notice some irritation and congestion in your nose or some drainage.  This is from the oxygen used during your procedure.  There is no need for concern and it should clear up in a day or so.  SYMPTOMS TO REPORT IMMEDIATELY:     Following upper endoscopy (EGD)  Vomiting of blood or coffee ground material  New chest pain or pain under the shoulder blades  Painful or persistently difficult swallowing  New shortness of breath  Fever of 100F or higher  Black, tarry-looking stools  For urgent or emergent issues, a gastroenterologist can be reached at any hour by calling [phone].   DIET:  We do recommend a small meal at first, but then you may proceed to your regular diet.  Drink plenty of fluids but you should  avoid alcoholic beverages for 24 hours.  ACTIVITY:  You should plan to take it easy for the rest of today and you should NOT DRIVE or use heavy machinery until tomorrow (because of the sedation medicines used during the test).    FOLLOW UP: Our staff will call the number listed on your records the next business day following your procedure to check on you and address any questions or concerns that you may have regarding the information given to you following your procedure. If we do not reach you, we will leave a message.  However, if you are feeling well and you are not experiencing any problems, there is no need to return our call.  We will assume that you have returned to your regular daily activities without incident.  If any biopsies were taken you will be contacted by phone or by letter within the next 1-3 weeks.  Please call us at [phone] if you have not heard about the biopsies in 3 weeks.    SIGNATURES/CONFIDENTIALITY: You and/or your care partner have signed paperwork which will be entered into your electronic medical record.  These signatures attest to the fact that that the information above on your After Visit Summary has been reviewed and is understood.  Full responsibility of the confidentiality of this discharge information lies with you and/or your care-partner.

## 2018-10-11 ENCOUNTER — Telehealth: Payer: Self-pay

## 2018-10-11 NOTE — Telephone Encounter (Signed)
  Follow up Call-  Call back number 10/10/2018  Post procedure Call Back phone  # [phone]  Permission to leave phone message Yes  Some recent data might be hidden     Patient questions:  Do you have a fever, pain , or abdominal swelling? No Pain Score  0  Have you tolerated food without any problems? Yes  Have you been able to return to your normal activities? Yes  Do you have any questions about your discharge instructions: Diet   No Medications  No Follow up visit  No  Do you have questions or concerns about your Care? No  Actions: * If pain score is 4 or above: No action needed, pain <4

## 2018-10-14 DIAGNOSIS — K8020 Calculus of gallbladder without cholecystitis without obstruction: Secondary | ICD-10-CM | POA: Diagnosis not present

## 2018-10-30 ENCOUNTER — Encounter: Payer: Self-pay | Admitting: Gastroenterology

## 2018-10-31 ENCOUNTER — Other Ambulatory Visit: Payer: Self-pay

## 2018-10-31 DIAGNOSIS — A048 Other specified bacterial intestinal infections: Secondary | ICD-10-CM

## 2018-10-31 MED ORDER — METRONIDAZOLE 500 MG PO TABS: 500.0000 mg | ORAL_TABLET | Freq: Two times a day (BID) | ORAL | 0 refills | Status: AC

## 2018-10-31 MED ORDER — CLARITHROMYCIN 500 MG PO TABS: 500.0000 mg | ORAL_TABLET | Freq: Two times a day (BID) | ORAL | 0 refills | Status: AC

## 2018-11-04 ENCOUNTER — Telehealth: Payer: Self-pay | Admitting: Gastroenterology

## 2018-11-04 NOTE — Telephone Encounter (Signed)
PT called advised that he is getting a very bad after taste with the med metroNIDAZOLE (FLAGYL) 500 MG and a extremely bad head ache. Pt would like to know if he can stop taking the med.

## 2018-11-04 NOTE — Telephone Encounter (Signed)
Called and spoke with patient-patient informed of importance of taking medication for H.Pylori; patient also advised he could get assorted candy to have for "bad taste" in his mouth; patient also advised to drink plenty of fluids and to take medication after eating to decrease "bad taste" associated with medication; patient reports he will try to continue on the medication and call back to the office if he is unable to complete medication regimen; patient was advised to call back if questions/concerns arise; patient verbalized understanding of information/instructions;

## 2018-12-16 ENCOUNTER — Other Ambulatory Visit: Payer: Self-pay

## 2018-12-30 DIAGNOSIS — Z0000 Encounter for general adult medical examination without abnormal findings: Secondary | ICD-10-CM | POA: Diagnosis not present

## 2018-12-30 DIAGNOSIS — Z1211 Encounter for screening for malignant neoplasm of colon: Secondary | ICD-10-CM | POA: Diagnosis not present

## 2018-12-30 DIAGNOSIS — Z9181 History of falling: Secondary | ICD-10-CM | POA: Diagnosis not present

## 2018-12-30 DIAGNOSIS — Z1331 Encounter for screening for depression: Secondary | ICD-10-CM | POA: Diagnosis not present

## 2018-12-30 DIAGNOSIS — Z125 Encounter for screening for malignant neoplasm of prostate: Secondary | ICD-10-CM | POA: Diagnosis not present

## 2018-12-30 DIAGNOSIS — E785 Hyperlipidemia, unspecified: Secondary | ICD-10-CM | POA: Diagnosis not present

## 2019-01-01 DIAGNOSIS — K8020 Calculus of gallbladder without cholecystitis without obstruction: Secondary | ICD-10-CM | POA: Diagnosis not present

## 2019-01-27 DIAGNOSIS — I10 Essential (primary) hypertension: Secondary | ICD-10-CM | POA: Diagnosis not present

## 2019-01-27 DIAGNOSIS — R1011 Right upper quadrant pain: Secondary | ICD-10-CM | POA: Diagnosis not present

## 2019-01-27 DIAGNOSIS — I2510 Atherosclerotic heart disease of native coronary artery without angina pectoris: Secondary | ICD-10-CM | POA: Diagnosis not present

## 2019-01-27 DIAGNOSIS — E785 Hyperlipidemia, unspecified: Secondary | ICD-10-CM | POA: Diagnosis not present

## 2019-02-05 DIAGNOSIS — G894 Chronic pain syndrome: Secondary | ICD-10-CM | POA: Diagnosis not present

## 2019-03-10 DIAGNOSIS — E785 Hyperlipidemia, unspecified: Secondary | ICD-10-CM | POA: Diagnosis not present

## 2019-03-10 DIAGNOSIS — Z79899 Other long term (current) drug therapy: Secondary | ICD-10-CM | POA: Diagnosis not present

## 2019-03-10 DIAGNOSIS — Z91018 Allergy to other foods: Secondary | ICD-10-CM | POA: Diagnosis not present

## 2019-03-10 DIAGNOSIS — E559 Vitamin D deficiency, unspecified: Secondary | ICD-10-CM | POA: Diagnosis not present

## 2019-05-05 DIAGNOSIS — E785 Hyperlipidemia, unspecified: Secondary | ICD-10-CM | POA: Diagnosis not present

## 2019-05-05 DIAGNOSIS — R1011 Right upper quadrant pain: Secondary | ICD-10-CM | POA: Diagnosis not present

## 2019-05-05 DIAGNOSIS — I10 Essential (primary) hypertension: Secondary | ICD-10-CM | POA: Diagnosis not present

## 2019-05-05 DIAGNOSIS — I2510 Atherosclerotic heart disease of native coronary artery without angina pectoris: Secondary | ICD-10-CM | POA: Diagnosis not present

## 2019-05-05 DIAGNOSIS — Z139 Encounter for screening, unspecified: Secondary | ICD-10-CM | POA: Diagnosis not present

## 2019-06-09 DIAGNOSIS — G894 Chronic pain syndrome: Secondary | ICD-10-CM | POA: Diagnosis not present

## 2019-06-09 DIAGNOSIS — Z5181 Encounter for therapeutic drug level monitoring: Secondary | ICD-10-CM | POA: Diagnosis not present

## 2019-06-09 DIAGNOSIS — Z79899 Other long term (current) drug therapy: Secondary | ICD-10-CM | POA: Diagnosis not present

## 2019-06-20 DIAGNOSIS — Z23 Encounter for immunization: Secondary | ICD-10-CM | POA: Diagnosis not present

## 2019-07-30 DIAGNOSIS — G8929 Other chronic pain: Secondary | ICD-10-CM | POA: Diagnosis not present

## 2019-07-30 DIAGNOSIS — M549 Dorsalgia, unspecified: Secondary | ICD-10-CM | POA: Diagnosis not present

## 2019-07-30 DIAGNOSIS — I2510 Atherosclerotic heart disease of native coronary artery without angina pectoris: Secondary | ICD-10-CM | POA: Diagnosis not present

## 2019-07-30 DIAGNOSIS — F418 Other specified anxiety disorders: Secondary | ICD-10-CM | POA: Diagnosis not present

## 2019-08-21 DIAGNOSIS — L820 Inflamed seborrheic keratosis: Secondary | ICD-10-CM | POA: Diagnosis not present

## 2019-08-21 DIAGNOSIS — L301 Dyshidrosis [pompholyx]: Secondary | ICD-10-CM | POA: Diagnosis not present

## 2019-08-21 DIAGNOSIS — L821 Other seborrheic keratosis: Secondary | ICD-10-CM | POA: Diagnosis not present

## 2019-08-21 DIAGNOSIS — L570 Actinic keratosis: Secondary | ICD-10-CM | POA: Diagnosis not present

## 2019-08-21 DIAGNOSIS — B351 Tinea unguium: Secondary | ICD-10-CM | POA: Diagnosis not present

## 2019-09-25 DIAGNOSIS — L570 Actinic keratosis: Secondary | ICD-10-CM | POA: Diagnosis not present

## 2019-09-25 DIAGNOSIS — L301 Dyshidrosis [pompholyx]: Secondary | ICD-10-CM | POA: Diagnosis not present

## 2019-10-08 DIAGNOSIS — G894 Chronic pain syndrome: Secondary | ICD-10-CM | POA: Diagnosis not present

## 2019-10-13 DIAGNOSIS — N401 Enlarged prostate with lower urinary tract symptoms: Secondary | ICD-10-CM | POA: Diagnosis not present

## 2019-10-13 DIAGNOSIS — R351 Nocturia: Secondary | ICD-10-CM | POA: Diagnosis not present

## 2019-10-13 DIAGNOSIS — R310 Gross hematuria: Secondary | ICD-10-CM | POA: Diagnosis not present

## 2019-10-16 DIAGNOSIS — R310 Gross hematuria: Secondary | ICD-10-CM | POA: Diagnosis not present

## 2019-10-16 DIAGNOSIS — K8020 Calculus of gallbladder without cholecystitis without obstruction: Secondary | ICD-10-CM | POA: Diagnosis not present

## 2019-10-25 DIAGNOSIS — Z23 Encounter for immunization: Secondary | ICD-10-CM | POA: Diagnosis not present

## 2019-10-28 ENCOUNTER — Other Ambulatory Visit: Payer: Self-pay | Admitting: Gastroenterology

## 2019-10-28 DIAGNOSIS — A048 Other specified bacterial intestinal infections: Secondary | ICD-10-CM

## 2019-11-02 ENCOUNTER — Other Ambulatory Visit: Payer: Self-pay | Admitting: Gastroenterology

## 2019-11-06 DIAGNOSIS — E785 Hyperlipidemia, unspecified: Secondary | ICD-10-CM | POA: Diagnosis not present

## 2019-11-06 DIAGNOSIS — G4733 Obstructive sleep apnea (adult) (pediatric): Secondary | ICD-10-CM | POA: Diagnosis not present

## 2019-11-06 DIAGNOSIS — I2510 Atherosclerotic heart disease of native coronary artery without angina pectoris: Secondary | ICD-10-CM | POA: Diagnosis not present

## 2019-11-06 DIAGNOSIS — I10 Essential (primary) hypertension: Secondary | ICD-10-CM | POA: Diagnosis not present

## 2019-11-13 DIAGNOSIS — N281 Cyst of kidney, acquired: Secondary | ICD-10-CM | POA: Diagnosis not present

## 2019-11-13 DIAGNOSIS — R972 Elevated prostate specific antigen [PSA]: Secondary | ICD-10-CM | POA: Diagnosis not present

## 2019-11-13 DIAGNOSIS — C674 Malignant neoplasm of posterior wall of bladder: Secondary | ICD-10-CM | POA: Diagnosis not present

## 2019-11-13 DIAGNOSIS — R310 Gross hematuria: Secondary | ICD-10-CM | POA: Diagnosis not present

## 2019-11-14 DIAGNOSIS — Z1159 Encounter for screening for other viral diseases: Secondary | ICD-10-CM | POA: Diagnosis not present

## 2019-11-22 DIAGNOSIS — Z23 Encounter for immunization: Secondary | ICD-10-CM | POA: Diagnosis not present
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
## 2019-11-24 ENCOUNTER — Ambulatory Visit (INDEPENDENT_AMBULATORY_CARE_PROVIDER_SITE_OTHER): Payer: Medicare Other | Admitting: Cardiovascular Disease

## 2019-11-24 ENCOUNTER — Other Ambulatory Visit: Payer: Self-pay

## 2019-11-24 ENCOUNTER — Encounter: Payer: Self-pay | Admitting: Cardiovascular Disease

## 2019-11-24 VITALS — BP 140/88 | HR 87 | Temp 98.0°F | Ht 72.0 in | Wt 198.0 lb

## 2019-11-24 DIAGNOSIS — Z01810 Encounter for preprocedural cardiovascular examination: Secondary | ICD-10-CM

## 2019-11-24 DIAGNOSIS — I25810 Atherosclerosis of coronary artery bypass graft(s) without angina pectoris: Secondary | ICD-10-CM

## 2019-11-24 DIAGNOSIS — I10 Essential (primary) hypertension: Secondary | ICD-10-CM

## 2019-11-24 DIAGNOSIS — E782 Mixed hyperlipidemia: Secondary | ICD-10-CM

## 2019-11-24 NOTE — Patient Instructions (Signed)
Medication Instructions:  The current medical regimen is effective;  continue present plan and medications.  *If you need a refill on your cardiac medications before your next appointment, please call your pharmacy*   Follow-Up: At [facility], you and your health needs are our priority.  As part of our continuing mission to provide you with exceptional heart care, we have created designated Provider Care Teams.  These Care Teams include your primary Cardiologist (physician) and Advanced Practice Providers (APPs -  Physician Assistants and Nurse Practitioners) who all work together to provide you with the care you need, when you need it.  We recommend signing up for the patient portal called "MyChart".  Sign up information is provided on this After Visit Summary.  MyChart is used to connect with patients for Virtual Visits (Telemedicine).  Patients are able to view lab/test results, encounter notes, upcoming appointments, etc.  Non-urgent messages can be sent to your provider as well.   To learn more about what you can do with MyChart, go to [email or web address].    Your next appointment:   4 month(s)  The format for your next appointment:   In Person  Provider:   [place] O'Neal, MD     

## 2019-11-26 DIAGNOSIS — J3489 Other specified disorders of nose and nasal sinuses: Secondary | ICD-10-CM | POA: Diagnosis not present

## 2019-11-26 DIAGNOSIS — Z20828 Contact with and (suspected) exposure to other viral communicable diseases: Secondary | ICD-10-CM | POA: Diagnosis not present

## 2019-11-27 DIAGNOSIS — C672 Malignant neoplasm of lateral wall of bladder: Secondary | ICD-10-CM | POA: Diagnosis not present

## 2019-11-27 DIAGNOSIS — E785 Hyperlipidemia, unspecified: Secondary | ICD-10-CM | POA: Diagnosis not present

## 2019-11-27 DIAGNOSIS — I10 Essential (primary) hypertension: Secondary | ICD-10-CM | POA: Diagnosis not present

## 2019-11-27 DIAGNOSIS — R310 Gross hematuria: Secondary | ICD-10-CM | POA: Diagnosis not present

## 2019-11-27 DIAGNOSIS — Z87891 Personal history of nicotine dependence: Secondary | ICD-10-CM | POA: Diagnosis not present

## 2019-11-27 DIAGNOSIS — I252 Old myocardial infarction: Secondary | ICD-10-CM | POA: Diagnosis not present

## 2019-11-27 DIAGNOSIS — J449 Chronic obstructive pulmonary disease, unspecified: Secondary | ICD-10-CM | POA: Diagnosis not present

## 2019-11-27 DIAGNOSIS — H9190 Unspecified hearing loss, unspecified ear: Secondary | ICD-10-CM | POA: Diagnosis not present

## 2019-11-27 DIAGNOSIS — Z951 Presence of aortocoronary bypass graft: Secondary | ICD-10-CM | POA: Diagnosis not present

## 2019-11-27 DIAGNOSIS — Z7982 Long term (current) use of aspirin: Secondary | ICD-10-CM | POA: Diagnosis not present

## 2019-11-27 DIAGNOSIS — C674 Malignant neoplasm of posterior wall of bladder: Secondary | ICD-10-CM | POA: Diagnosis not present

## 2019-11-27 DIAGNOSIS — Z79899 Other long term (current) drug therapy: Secondary | ICD-10-CM | POA: Diagnosis not present

## 2019-11-27 DIAGNOSIS — D494 Neoplasm of unspecified behavior of bladder: Secondary | ICD-10-CM | POA: Diagnosis not present

## 2019-11-27 DIAGNOSIS — C673 Malignant neoplasm of anterior wall of bladder: Secondary | ICD-10-CM | POA: Diagnosis not present

## 2019-12-04 DIAGNOSIS — N401 Enlarged prostate with lower urinary tract symptoms: Secondary | ICD-10-CM | POA: Diagnosis not present

## 2019-12-04 DIAGNOSIS — R972 Elevated prostate specific antigen [PSA]: Secondary | ICD-10-CM | POA: Diagnosis not present

## 2019-12-04 DIAGNOSIS — C674 Malignant neoplasm of posterior wall of bladder: Secondary | ICD-10-CM | POA: Diagnosis not present

## 2019-12-05 DIAGNOSIS — I2510 Atherosclerotic heart disease of native coronary artery without angina pectoris: Secondary | ICD-10-CM | POA: Diagnosis not present

## 2019-12-05 DIAGNOSIS — F419 Anxiety disorder, unspecified: Secondary | ICD-10-CM | POA: Diagnosis not present

## 2019-12-05 DIAGNOSIS — C689 Malignant neoplasm of urinary organ, unspecified: Secondary | ICD-10-CM | POA: Diagnosis not present

## 2019-12-05 DIAGNOSIS — M549 Dorsalgia, unspecified: Secondary | ICD-10-CM | POA: Diagnosis not present

## 2019-12-23 DIAGNOSIS — M545 Low back pain: Secondary | ICD-10-CM | POA: Diagnosis not present

## 2019-12-23 DIAGNOSIS — Z79891 Long term (current) use of opiate analgesic: Secondary | ICD-10-CM | POA: Diagnosis not present

## 2019-12-23 DIAGNOSIS — M25512 Pain in left shoulder: Secondary | ICD-10-CM | POA: Diagnosis not present

## 2019-12-23 DIAGNOSIS — M5136 Other intervertebral disc degeneration, lumbar region: Secondary | ICD-10-CM | POA: Diagnosis not present

## 2019-12-25 DIAGNOSIS — R972 Elevated prostate specific antigen [PSA]: Secondary | ICD-10-CM | POA: Diagnosis not present

## 2020-01-01 DIAGNOSIS — Z0000 Encounter for general adult medical examination without abnormal findings: Secondary | ICD-10-CM | POA: Diagnosis not present

## 2020-01-01 DIAGNOSIS — Z9181 History of falling: Secondary | ICD-10-CM | POA: Diagnosis not present

## 2020-01-01 DIAGNOSIS — E785 Hyperlipidemia, unspecified: Secondary | ICD-10-CM | POA: Diagnosis not present

## 2020-01-01 DIAGNOSIS — Z1331 Encounter for screening for depression: Secondary | ICD-10-CM | POA: Diagnosis not present

## 2020-01-01 DIAGNOSIS — Z125 Encounter for screening for malignant neoplasm of prostate: Secondary | ICD-10-CM | POA: Diagnosis not present

## 2020-01-09 DIAGNOSIS — M25511 Pain in right shoulder: Secondary | ICD-10-CM | POA: Diagnosis not present

## 2020-01-09 DIAGNOSIS — M25512 Pain in left shoulder: Secondary | ICD-10-CM | POA: Diagnosis not present

## 2020-03-05 ENCOUNTER — Telehealth: Payer: Self-pay | Admitting: Cardiovascular Disease

## 2020-03-05 NOTE — Telephone Encounter (Signed)
I attempted to contact patient 03/05/20 to schedule follow up visit from patients recall list. The patient didn't answer, left message for patient to return call.

## 2020-03-17 DIAGNOSIS — M549 Dorsalgia, unspecified: Secondary | ICD-10-CM | POA: Diagnosis not present

## 2020-03-17 DIAGNOSIS — N529 Male erectile dysfunction, unspecified: Secondary | ICD-10-CM | POA: Diagnosis not present

## 2020-03-17 DIAGNOSIS — K219 Gastro-esophageal reflux disease without esophagitis: Secondary | ICD-10-CM | POA: Diagnosis not present

## 2020-03-17 DIAGNOSIS — C689 Malignant neoplasm of urinary organ, unspecified: Secondary | ICD-10-CM | POA: Diagnosis not present

## 2020-03-17 DIAGNOSIS — G4733 Obstructive sleep apnea (adult) (pediatric): Secondary | ICD-10-CM | POA: Diagnosis not present

## 2020-03-17 DIAGNOSIS — Z6826 Body mass index (BMI) 26.0-26.9, adult: Secondary | ICD-10-CM | POA: Diagnosis not present

## 2020-03-17 DIAGNOSIS — E559 Vitamin D deficiency, unspecified: Secondary | ICD-10-CM | POA: Diagnosis not present

## 2020-03-17 DIAGNOSIS — I2510 Atherosclerotic heart disease of native coronary artery without angina pectoris: Secondary | ICD-10-CM | POA: Diagnosis not present

## 2020-03-17 DIAGNOSIS — G8929 Other chronic pain: Secondary | ICD-10-CM | POA: Diagnosis not present

## 2020-03-17 DIAGNOSIS — I10 Essential (primary) hypertension: Secondary | ICD-10-CM | POA: Diagnosis not present

## 2020-03-17 DIAGNOSIS — J309 Allergic rhinitis, unspecified: Secondary | ICD-10-CM | POA: Diagnosis not present

## 2020-03-17 DIAGNOSIS — E785 Hyperlipidemia, unspecified: Secondary | ICD-10-CM | POA: Diagnosis not present

## 2020-04-14 DIAGNOSIS — Z79899 Other long term (current) drug therapy: Secondary | ICD-10-CM | POA: Diagnosis not present

## 2020-04-14 DIAGNOSIS — M5136 Other intervertebral disc degeneration, lumbar region: Secondary | ICD-10-CM | POA: Diagnosis not present

## 2020-04-14 DIAGNOSIS — Z5181 Encounter for therapeutic drug level monitoring: Secondary | ICD-10-CM | POA: Diagnosis not present

## 2020-04-14 DIAGNOSIS — C61 Malignant neoplasm of prostate: Secondary | ICD-10-CM | POA: Diagnosis not present

## 2020-04-14 DIAGNOSIS — M542 Cervicalgia: Secondary | ICD-10-CM | POA: Diagnosis not present

## 2020-04-14 DIAGNOSIS — M5412 Radiculopathy, cervical region: Secondary | ICD-10-CM | POA: Diagnosis not present

## 2020-04-14 DIAGNOSIS — G894 Chronic pain syndrome: Secondary | ICD-10-CM | POA: Diagnosis not present

## 2020-04-14 DIAGNOSIS — M545 Low back pain: Secondary | ICD-10-CM | POA: Diagnosis not present

## 2020-04-16 DIAGNOSIS — M5412 Radiculopathy, cervical region: Secondary | ICD-10-CM | POA: Diagnosis not present

## 2020-04-16 DIAGNOSIS — M542 Cervicalgia: Secondary | ICD-10-CM | POA: Diagnosis not present

## 2020-04-21 DIAGNOSIS — M5412 Radiculopathy, cervical region: Secondary | ICD-10-CM | POA: Diagnosis not present

## 2020-04-25 DIAGNOSIS — Z23 Encounter for immunization: Secondary | ICD-10-CM | POA: Diagnosis not present

## 2020-05-11 DIAGNOSIS — Z79891 Long term (current) use of opiate analgesic: Secondary | ICD-10-CM | POA: Diagnosis not present

## 2020-05-11 DIAGNOSIS — M5412 Radiculopathy, cervical region: Secondary | ICD-10-CM | POA: Diagnosis not present

## 2020-05-18 DIAGNOSIS — G992 Myelopathy in diseases classified elsewhere: Secondary | ICD-10-CM | POA: Diagnosis not present

## 2020-05-18 DIAGNOSIS — M4802 Spinal stenosis, cervical region: Secondary | ICD-10-CM | POA: Diagnosis not present

## 2020-05-18 DIAGNOSIS — C689 Malignant neoplasm of urinary organ, unspecified: Secondary | ICD-10-CM | POA: Diagnosis not present

## 2020-05-18 DIAGNOSIS — Z6825 Body mass index (BMI) 25.0-25.9, adult: Secondary | ICD-10-CM | POA: Diagnosis not present

## 2020-05-18 DIAGNOSIS — I2510 Atherosclerotic heart disease of native coronary artery without angina pectoris: Secondary | ICD-10-CM | POA: Diagnosis not present

## 2020-05-20 DIAGNOSIS — E785 Hyperlipidemia, unspecified: Secondary | ICD-10-CM | POA: Diagnosis not present

## 2020-05-20 DIAGNOSIS — I2510 Atherosclerotic heart disease of native coronary artery without angina pectoris: Secondary | ICD-10-CM | POA: Diagnosis not present

## 2020-05-20 DIAGNOSIS — I10 Essential (primary) hypertension: Secondary | ICD-10-CM | POA: Diagnosis not present

## 2020-05-20 DIAGNOSIS — E559 Vitamin D deficiency, unspecified: Secondary | ICD-10-CM | POA: Diagnosis not present

## 2020-05-24 DIAGNOSIS — M5416 Radiculopathy, lumbar region: Secondary | ICD-10-CM | POA: Diagnosis not present

## 2020-05-24 DIAGNOSIS — M545 Low back pain: Secondary | ICD-10-CM | POA: Diagnosis not present

## 2020-06-10 ENCOUNTER — Encounter: Payer: Self-pay | Admitting: General Practice

## 2020-07-13 DIAGNOSIS — R972 Elevated prostate specific antigen [PSA]: Secondary | ICD-10-CM | POA: Diagnosis not present

## 2020-07-16 DIAGNOSIS — C674 Malignant neoplasm of posterior wall of bladder: Secondary | ICD-10-CM | POA: Diagnosis not present

## 2020-07-16 DIAGNOSIS — N401 Enlarged prostate with lower urinary tract symptoms: Secondary | ICD-10-CM | POA: Diagnosis not present

## 2020-08-09 DIAGNOSIS — C674 Malignant neoplasm of posterior wall of bladder: Secondary | ICD-10-CM | POA: Diagnosis not present

## 2020-08-09 DIAGNOSIS — N401 Enlarged prostate with lower urinary tract symptoms: Secondary | ICD-10-CM | POA: Diagnosis not present

## 2020-09-20 DIAGNOSIS — C679 Malignant neoplasm of bladder, unspecified: Secondary | ICD-10-CM | POA: Diagnosis not present

## 2020-09-20 DIAGNOSIS — M19019 Primary osteoarthritis, unspecified shoulder: Secondary | ICD-10-CM | POA: Diagnosis not present

## 2020-09-20 DIAGNOSIS — G894 Chronic pain syndrome: Secondary | ICD-10-CM | POA: Diagnosis not present

## 2020-09-20 DIAGNOSIS — M5412 Radiculopathy, cervical region: Secondary | ICD-10-CM | POA: Diagnosis not present

## 2020-10-08 DIAGNOSIS — M5412 Radiculopathy, cervical region: Secondary | ICD-10-CM | POA: Diagnosis not present

## 2020-10-12 ENCOUNTER — Telehealth: Payer: Self-pay | Admitting: *Deleted

## 2020-10-12 NOTE — Telephone Encounter (Signed)
Left message for the patient to call back.  Kerin Ransom PA-C 10/12/2020 2:10 PM

## 2020-10-12 NOTE — Telephone Encounter (Signed)
He is not on plavix. He is on aspirin. OK to hold aspirin 5 days before surgery and restart at the discretion of surgery.   [place] T. Audie Box, MD, [place]  [address] [place], [place] [address] [phone]  1:59 PM

## 2020-10-12 NOTE — Telephone Encounter (Signed)
Dr. Davina Poke this patient is scheduled for C-spine surgery on 10/26/20.  You last saw him in March 2021.  Usually our protocol is to have the patient seen in the office for preop clearance if it has been > 6 months.  I don't think we'll be able to get him in the office in that timeframe.  I would like to clear him over the phone with your permission.  We will also need clearance to hold his Plavix 5 to 7 days pre op and aspirin 3-5 days pre op.  Please reply to CV DIV PRE OP  Thanks  Kerin Ransom PA-C 10/12/2020 1:33 PM

## 2020-10-12 NOTE — Telephone Encounter (Signed)
   [facility] Pre-operative Risk Assessment    HEARTCARE STAFF: - Please ensure there is not already an duplicate clearance open for this procedure. - Under Visit Info/Reason for Call, type in Other and utilize the format Clearance MM/DD/YY or Clearance TBD. Do not use dashes or single digits. - If request is for dental extraction, please clarify the # of teeth to be extracted.  Request for surgical clearance:  1. What type of surgery is being performed? Cervical SNRB   2. When is this surgery scheduled? 10/26/2020   3. What type of clearance is required (medical clearance vs. Pharmacy clearance to hold med vs. Both)? Medical  4. Are there any medications that need to be held prior to surgery and how long? Plavix 5 days, not on current medication list   5. Practice name and name of physician performing surgery? [facility]   6. What is the office phone number? [phone]   7.   What is the office fax number? [phone]  8.   Anesthesia type (None, local, MAC, general) ? Georgiann Hahn 10/12/2020, 12:02 PM  _________________________________________________________________   (provider comments below)

## 2020-10-15 NOTE — Telephone Encounter (Signed)
   Primary Cardiologist: Evalina Field, MD  Chart reviewed as part of pre-operative protocol coverage. Patient was contacted 10/15/2020 in reference to pre-operative risk assessment for pending surgery as outlined below.  JAYMES HANG was last seen on 11/24/19 by Dr. Audie Box.  Since that day, KINGSTON SHAWGO has done fine from a cardiac standpoint. He can complete 4 MET's without anginal complaints.  Therefore, based on ACC/AHA guidelines, the patient would be at acceptable risk for the planned procedure without further cardiovascular testing.   The patient was advised that if he develops new symptoms prior to surgery to contact our office to arrange for a follow-up visit, and he verbalized understanding.  Per Dr. Audie Box, patient can hold aspirin 5 days prior to his upcoming procedure with plans to restart as soon as he is cleared to do so by his surgeon. He is no longer on plavix.   I will route this recommendation to the requesting party via Epic fax function and remove from pre-op pool. Please call with questions.  Abigail Butts, PA-C 10/15/2020, 1:30 PM

## 2020-10-26 DIAGNOSIS — M5412 Radiculopathy, cervical region: Secondary | ICD-10-CM | POA: Diagnosis not present
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
## 2020-11-02 ENCOUNTER — Other Ambulatory Visit: Payer: Self-pay

## 2020-11-02 ENCOUNTER — Encounter: Payer: Self-pay | Admitting: Cardiovascular Disease

## 2020-11-02 ENCOUNTER — Ambulatory Visit (INDEPENDENT_AMBULATORY_CARE_PROVIDER_SITE_OTHER): Payer: Medicare Other | Admitting: Cardiovascular Disease

## 2020-11-02 VITALS — BP 160/82 | HR 75 | Ht 72.0 in | Wt 153.2 lb

## 2020-11-02 DIAGNOSIS — Z01810 Encounter for preprocedural cardiovascular examination: Secondary | ICD-10-CM | POA: Diagnosis not present

## 2020-11-02 DIAGNOSIS — E782 Mixed hyperlipidemia: Secondary | ICD-10-CM

## 2020-11-02 DIAGNOSIS — I10 Essential (primary) hypertension: Secondary | ICD-10-CM

## 2020-11-02 DIAGNOSIS — I25810 Atherosclerosis of coronary artery bypass graft(s) without angina pectoris: Secondary | ICD-10-CM | POA: Diagnosis not present

## 2020-11-02 NOTE — Patient Instructions (Signed)
Medication Instructions:  The current medical regimen is effective;  continue present plan and medications.  *If you need a refill on your cardiac medications before your next appointment, please call your pharmacy*  Follow-Up: At [facility], you and your health needs are our priority.  As part of our continuing mission to provide you with exceptional heart care, we have created designated Provider Care Teams.  These Care Teams include your primary Cardiologist (physician) and Advanced Practice Providers (APPs -  Physician Assistants and Nurse Practitioners) who all work together to provide you with the care you need, when you need it.  We recommend signing up for the patient portal called "MyChart".  Sign up information is provided on this After Visit Summary.  MyChart is used to connect with patients for Virtual Visits (Telemedicine).  Patients are able to view lab/test results, encounter notes, upcoming appointments, etc.  Non-urgent messages can be sent to your provider as well.   To learn more about what you can do with MyChart, go to [email or web address].    Your next appointment:   6 month(s)  The format for your next appointment:   In Person  Provider:   Eleonore Chiquito, MD   Other Instructions We will send clearance.

## 2020-11-17 ENCOUNTER — Telehealth: Payer: Self-pay | Admitting: Cardiovascular Disease

## 2020-11-17 DIAGNOSIS — Z6823 Body mass index (BMI) 23.0-23.9, adult: Secondary | ICD-10-CM | POA: Diagnosis not present

## 2020-11-17 DIAGNOSIS — C689 Malignant neoplasm of urinary organ, unspecified: Secondary | ICD-10-CM | POA: Diagnosis not present

## 2020-11-17 DIAGNOSIS — R634 Abnormal weight loss: Secondary | ICD-10-CM | POA: Diagnosis not present

## 2020-11-17 DIAGNOSIS — I2510 Atherosclerotic heart disease of native coronary artery without angina pectoris: Secondary | ICD-10-CM | POA: Diagnosis not present

## 2020-11-17 DIAGNOSIS — K862 Cyst of pancreas: Secondary | ICD-10-CM | POA: Diagnosis not present

## 2020-11-17 NOTE — Telephone Encounter (Signed)
Erik Alvarado from [facility] called in and stated that she has not yet rec'd the faxed office note ?    Confirmed fax [phone]

## 2020-11-17 NOTE — Telephone Encounter (Signed)
Clearance faxed

## 2020-11-17 NOTE — Telephone Encounter (Signed)
New message:    Patient calling stating that he had a apt with Dr. Audie Box last week for a clearance. Please call patient I do not see anything concering Medical clearance. If patient do not answers the phone please leave a message.

## 2020-11-17 NOTE — Telephone Encounter (Signed)
Spoke with pt, aware will fax office note containing clearance at [facility] at confirmed fax [phone].

## 2020-11-19 DIAGNOSIS — C679 Malignant neoplasm of bladder, unspecified: Secondary | ICD-10-CM | POA: Diagnosis not present

## 2020-11-19 DIAGNOSIS — Z1152 Encounter for screening for COVID-19: Secondary | ICD-10-CM | POA: Diagnosis not present

## 2020-11-19 DIAGNOSIS — Z1159 Encounter for screening for other viral diseases: Secondary | ICD-10-CM | POA: Diagnosis not present

## 2020-11-22 NOTE — Telephone Encounter (Signed)
   Primary Cardiologist: Evalina Field, MD  Chart reviewed as part of pre-operative protocol coverage. Given past medical history and time since last visit, based on ACC/AHA guidelines, Erik Alvarado would be at acceptable risk for the planned procedure without further cardiovascular testing.   OK to hold aspirin 5 days pre op if needed.  The patient was advised that if he develops new symptoms prior to surgery to contact our office to arrange for a follow-up visit, and he verbalized understanding.  I will route this recommendation to the requesting party via Epic fax function and remove from pre-op pool.  Please call with questions.  Kerin Ransom, PA-C 11/22/2020, 4:34 PM

## 2020-11-22 NOTE — Telephone Encounter (Signed)
Spoke with Patient, he states the office has still not received the clearance. Please refax to [phone]. He states he is having surgery first thing Thursday morning.

## 2020-11-23 DIAGNOSIS — M47816 Spondylosis without myelopathy or radiculopathy, lumbar region: Secondary | ICD-10-CM | POA: Diagnosis not present

## 2020-11-23 DIAGNOSIS — I2510 Atherosclerotic heart disease of native coronary artery without angina pectoris: Secondary | ICD-10-CM | POA: Diagnosis not present

## 2020-11-23 DIAGNOSIS — R634 Abnormal weight loss: Secondary | ICD-10-CM | POA: Diagnosis not present

## 2020-11-23 DIAGNOSIS — N281 Cyst of kidney, acquired: Secondary | ICD-10-CM | POA: Diagnosis not present

## 2020-11-23 DIAGNOSIS — K7689 Other specified diseases of liver: Secondary | ICD-10-CM | POA: Diagnosis not present

## 2020-11-23 DIAGNOSIS — J984 Other disorders of lung: Secondary | ICD-10-CM | POA: Diagnosis not present

## 2020-11-23 DIAGNOSIS — K862 Cyst of pancreas: Secondary | ICD-10-CM | POA: Diagnosis not present

## 2020-11-24 DIAGNOSIS — Z20828 Contact with and (suspected) exposure to other viral communicable diseases: Secondary | ICD-10-CM | POA: Diagnosis not present

## 2020-11-24 DIAGNOSIS — Z1159 Encounter for screening for other viral diseases: Secondary | ICD-10-CM | POA: Diagnosis not present

## 2020-11-25 DIAGNOSIS — I10 Essential (primary) hypertension: Secondary | ICD-10-CM | POA: Diagnosis not present

## 2020-11-25 DIAGNOSIS — M549 Dorsalgia, unspecified: Secondary | ICD-10-CM | POA: Diagnosis not present

## 2020-11-25 DIAGNOSIS — Z8551 Personal history of malignant neoplasm of bladder: Secondary | ICD-10-CM | POA: Diagnosis not present

## 2020-11-25 DIAGNOSIS — M542 Cervicalgia: Secondary | ICD-10-CM | POA: Diagnosis not present

## 2020-11-25 DIAGNOSIS — Z87891 Personal history of nicotine dependence: Secondary | ICD-10-CM | POA: Diagnosis not present

## 2020-11-25 DIAGNOSIS — J449 Chronic obstructive pulmonary disease, unspecified: Secondary | ICD-10-CM | POA: Diagnosis not present

## 2020-11-25 DIAGNOSIS — G4733 Obstructive sleep apnea (adult) (pediatric): Secondary | ICD-10-CM | POA: Diagnosis not present

## 2020-11-25 DIAGNOSIS — R001 Bradycardia, unspecified: Secondary | ICD-10-CM | POA: Diagnosis not present

## 2020-11-25 DIAGNOSIS — M4800 Spinal stenosis, site unspecified: Secondary | ICD-10-CM | POA: Diagnosis not present

## 2020-11-25 DIAGNOSIS — I252 Old myocardial infarction: Secondary | ICD-10-CM | POA: Diagnosis not present

## 2020-11-25 DIAGNOSIS — Z951 Presence of aortocoronary bypass graft: Secondary | ICD-10-CM | POA: Diagnosis not present

## 2020-11-25 DIAGNOSIS — C672 Malignant neoplasm of lateral wall of bladder: Secondary | ICD-10-CM | POA: Diagnosis not present

## 2020-11-25 DIAGNOSIS — I4510 Unspecified right bundle-branch block: Secondary | ICD-10-CM | POA: Diagnosis not present

## 2020-11-25 DIAGNOSIS — Z79891 Long term (current) use of opiate analgesic: Secondary | ICD-10-CM | POA: Diagnosis not present

## 2020-11-25 DIAGNOSIS — D494 Neoplasm of unspecified behavior of bladder: Secondary | ICD-10-CM | POA: Diagnosis not present

## 2020-11-25 DIAGNOSIS — R319 Hematuria, unspecified: Secondary | ICD-10-CM | POA: Diagnosis not present

## 2020-11-25 DIAGNOSIS — M5410 Radiculopathy, site unspecified: Secondary | ICD-10-CM | POA: Diagnosis not present

## 2020-11-25 DIAGNOSIS — Z79899 Other long term (current) drug therapy: Secondary | ICD-10-CM | POA: Diagnosis not present

## 2020-11-25 DIAGNOSIS — N401 Enlarged prostate with lower urinary tract symptoms: Secondary | ICD-10-CM | POA: Diagnosis not present

## 2020-11-25 DIAGNOSIS — E785 Hyperlipidemia, unspecified: Secondary | ICD-10-CM | POA: Diagnosis not present

## 2020-11-25 DIAGNOSIS — I2510 Atherosclerotic heart disease of native coronary artery without angina pectoris: Secondary | ICD-10-CM | POA: Diagnosis not present

## 2020-11-25 DIAGNOSIS — Z7982 Long term (current) use of aspirin: Secondary | ICD-10-CM | POA: Diagnosis not present

## 2020-11-25 DIAGNOSIS — K5909 Other constipation: Secondary | ICD-10-CM | POA: Diagnosis not present

## 2020-12-01 DIAGNOSIS — M954 Acquired deformity of chest and rib: Secondary | ICD-10-CM | POA: Diagnosis not present

## 2020-12-01 DIAGNOSIS — M899 Disorder of bone, unspecified: Secondary | ICD-10-CM | POA: Diagnosis not present

## 2020-12-01 DIAGNOSIS — C679 Malignant neoplasm of bladder, unspecified: Secondary | ICD-10-CM | POA: Diagnosis not present

## 2020-12-01 DIAGNOSIS — R634 Abnormal weight loss: Secondary | ICD-10-CM | POA: Diagnosis not present

## 2020-12-06 DIAGNOSIS — M5459 Other low back pain: Secondary | ICD-10-CM | POA: Diagnosis not present

## 2020-12-17 DIAGNOSIS — C679 Malignant neoplasm of bladder, unspecified: Secondary | ICD-10-CM | POA: Diagnosis not present

## 2020-12-17 DIAGNOSIS — N401 Enlarged prostate with lower urinary tract symptoms: Secondary | ICD-10-CM | POA: Diagnosis not present

## 2021-01-10 DIAGNOSIS — L578 Other skin changes due to chronic exposure to nonionizing radiation: Secondary | ICD-10-CM | POA: Diagnosis not present

## 2021-01-10 DIAGNOSIS — L570 Actinic keratosis: Secondary | ICD-10-CM | POA: Diagnosis not present

## 2021-01-10 DIAGNOSIS — L821 Other seborrheic keratosis: Secondary | ICD-10-CM | POA: Diagnosis not present

## 2021-01-17 DIAGNOSIS — M25512 Pain in left shoulder: Secondary | ICD-10-CM | POA: Diagnosis not present

## 2021-01-17 DIAGNOSIS — Z79899 Other long term (current) drug therapy: Secondary | ICD-10-CM | POA: Diagnosis not present

## 2021-01-17 DIAGNOSIS — M5412 Radiculopathy, cervical region: Secondary | ICD-10-CM | POA: Diagnosis not present

## 2021-01-18 DIAGNOSIS — N401 Enlarged prostate with lower urinary tract symptoms: Secondary | ICD-10-CM | POA: Diagnosis not present

## 2021-01-18 DIAGNOSIS — R339 Retention of urine, unspecified: Secondary | ICD-10-CM | POA: Diagnosis not present

## 2021-01-18 DIAGNOSIS — C679 Malignant neoplasm of bladder, unspecified: Secondary | ICD-10-CM | POA: Diagnosis not present

## 2021-01-25 DIAGNOSIS — I25810 Atherosclerosis of coronary artery bypass graft(s) without angina pectoris: Secondary | ICD-10-CM | POA: Diagnosis not present

## 2021-01-25 DIAGNOSIS — Z6821 Body mass index (BMI) 21.0-21.9, adult: Secondary | ICD-10-CM | POA: Diagnosis not present

## 2021-01-25 DIAGNOSIS — R634 Abnormal weight loss: Secondary | ICD-10-CM | POA: Diagnosis not present

## 2021-01-25 DIAGNOSIS — E782 Mixed hyperlipidemia: Secondary | ICD-10-CM | POA: Diagnosis not present

## 2021-02-09 DIAGNOSIS — Z1211 Encounter for screening for malignant neoplasm of colon: Secondary | ICD-10-CM | POA: Diagnosis not present

## 2021-02-14 DIAGNOSIS — M791 Myalgia, unspecified site: Secondary | ICD-10-CM | POA: Diagnosis not present

## 2021-02-14 DIAGNOSIS — G894 Chronic pain syndrome: Secondary | ICD-10-CM | POA: Diagnosis not present

## 2021-02-16 DIAGNOSIS — C679 Malignant neoplasm of bladder, unspecified: Secondary | ICD-10-CM | POA: Diagnosis not present

## 2021-02-16 DIAGNOSIS — R339 Retention of urine, unspecified: Secondary | ICD-10-CM | POA: Diagnosis not present

## 2021-02-16 DIAGNOSIS — N401 Enlarged prostate with lower urinary tract symptoms: Secondary | ICD-10-CM | POA: Diagnosis not present

## 2021-02-17 DIAGNOSIS — M5412 Radiculopathy, cervical region: Secondary | ICD-10-CM | POA: Diagnosis not present

## 2021-02-28 DIAGNOSIS — R935 Abnormal findings on diagnostic imaging of other abdominal regions, including retroperitoneum: Secondary | ICD-10-CM | POA: Diagnosis not present

## 2021-02-28 DIAGNOSIS — C679 Malignant neoplasm of bladder, unspecified: Secondary | ICD-10-CM | POA: Diagnosis not present

## 2021-02-28 DIAGNOSIS — R933 Abnormal findings on diagnostic imaging of other parts of digestive tract: Secondary | ICD-10-CM | POA: Diagnosis not present

## 2021-03-11 DIAGNOSIS — Z20822 Contact with and (suspected) exposure to covid-19: Secondary | ICD-10-CM | POA: Diagnosis not present

## 2021-03-21 DIAGNOSIS — Z20822 Contact with and (suspected) exposure to covid-19: Secondary | ICD-10-CM | POA: Diagnosis not present

## 2021-03-23 DIAGNOSIS — R339 Retention of urine, unspecified: Secondary | ICD-10-CM | POA: Diagnosis not present

## 2021-03-23 DIAGNOSIS — C679 Malignant neoplasm of bladder, unspecified: Secondary | ICD-10-CM | POA: Diagnosis not present

## 2021-03-23 DIAGNOSIS — N401 Enlarged prostate with lower urinary tract symptoms: Secondary | ICD-10-CM | POA: Diagnosis not present

## 2021-03-30 DIAGNOSIS — I2510 Atherosclerotic heart disease of native coronary artery without angina pectoris: Secondary | ICD-10-CM | POA: Diagnosis not present

## 2021-03-30 DIAGNOSIS — I10 Essential (primary) hypertension: Secondary | ICD-10-CM | POA: Diagnosis not present

## 2021-03-30 DIAGNOSIS — E785 Hyperlipidemia, unspecified: Secondary | ICD-10-CM | POA: Diagnosis not present

## 2021-03-30 DIAGNOSIS — R627 Adult failure to thrive: Secondary | ICD-10-CM | POA: Diagnosis not present

## 2021-03-30 DIAGNOSIS — I25708 Atherosclerosis of coronary artery bypass graft(s), unspecified, with other forms of angina pectoris: Secondary | ICD-10-CM | POA: Diagnosis not present

## 2021-04-05 DIAGNOSIS — M5412 Radiculopathy, cervical region: Secondary | ICD-10-CM | POA: Diagnosis not present

## 2021-04-05 DIAGNOSIS — M5136 Other intervertebral disc degeneration, lumbar region: Secondary | ICD-10-CM | POA: Diagnosis not present

## 2021-04-05 DIAGNOSIS — M791 Myalgia, unspecified site: Secondary | ICD-10-CM | POA: Diagnosis not present

## 2021-05-07 DIAGNOSIS — Z20822 Contact with and (suspected) exposure to covid-19: Secondary | ICD-10-CM | POA: Diagnosis not present

## 2021-05-10 DIAGNOSIS — M25551 Pain in right hip: Secondary | ICD-10-CM | POA: Diagnosis not present

## 2021-05-10 DIAGNOSIS — M2550 Pain in unspecified joint: Secondary | ICD-10-CM | POA: Diagnosis not present

## 2021-07-06 DIAGNOSIS — M5412 Radiculopathy, cervical region: Secondary | ICD-10-CM | POA: Diagnosis not present

## 2021-07-06 DIAGNOSIS — M064 Inflammatory polyarthropathy: Secondary | ICD-10-CM | POA: Diagnosis not present

## 2021-07-21 DIAGNOSIS — M79672 Pain in left foot: Secondary | ICD-10-CM | POA: Diagnosis not present

## 2021-07-21 DIAGNOSIS — M79642 Pain in left hand: Secondary | ICD-10-CM | POA: Diagnosis not present

## 2021-07-21 DIAGNOSIS — M2550 Pain in unspecified joint: Secondary | ICD-10-CM | POA: Diagnosis not present

## 2021-07-21 DIAGNOSIS — M79671 Pain in right foot: Secondary | ICD-10-CM | POA: Diagnosis not present

## 2021-07-21 DIAGNOSIS — M1990 Unspecified osteoarthritis, unspecified site: Secondary | ICD-10-CM | POA: Diagnosis not present

## 2021-07-21 DIAGNOSIS — M542 Cervicalgia: Secondary | ICD-10-CM | POA: Diagnosis not present

## 2021-07-21 DIAGNOSIS — M79641 Pain in right hand: Secondary | ICD-10-CM | POA: Diagnosis not present

## 2021-07-21 DIAGNOSIS — M2560 Stiffness of unspecified joint, not elsewhere classified: Secondary | ICD-10-CM | POA: Diagnosis not present

## 2021-07-21 DIAGNOSIS — M25519 Pain in unspecified shoulder: Secondary | ICD-10-CM | POA: Diagnosis not present

## 2021-07-21 DIAGNOSIS — M791 Myalgia, unspecified site: Secondary | ICD-10-CM | POA: Diagnosis not present

## 2021-07-21 DIAGNOSIS — M549 Dorsalgia, unspecified: Secondary | ICD-10-CM | POA: Diagnosis not present

## 2021-07-21 DIAGNOSIS — G8929 Other chronic pain: Secondary | ICD-10-CM | POA: Diagnosis not present

## 2021-08-02 DIAGNOSIS — M5412 Radiculopathy, cervical region: Secondary | ICD-10-CM | POA: Diagnosis not present

## 2021-08-22 DIAGNOSIS — M5459 Other low back pain: Secondary | ICD-10-CM | POA: Diagnosis not present

## 2021-08-25 DIAGNOSIS — L570 Actinic keratosis: Secondary | ICD-10-CM | POA: Diagnosis not present

## 2021-10-06 DIAGNOSIS — M5412 Radiculopathy, cervical region: Secondary | ICD-10-CM | POA: Diagnosis not present

## 2021-10-06 DIAGNOSIS — G894 Chronic pain syndrome: Secondary | ICD-10-CM | POA: Diagnosis not present

## 2021-10-06 DIAGNOSIS — Z79899 Other long term (current) drug therapy: Secondary | ICD-10-CM | POA: Diagnosis not present

## 2021-10-06 DIAGNOSIS — Z5181 Encounter for therapeutic drug level monitoring: Secondary | ICD-10-CM | POA: Diagnosis not present

## 2021-10-13 DIAGNOSIS — M5412 Radiculopathy, cervical region: Secondary | ICD-10-CM | POA: Diagnosis not present

## 2021-10-19 DIAGNOSIS — Z20822 Contact with and (suspected) exposure to covid-19: Secondary | ICD-10-CM | POA: Diagnosis not present

## 2021-11-24 DIAGNOSIS — G894 Chronic pain syndrome: Secondary | ICD-10-CM | POA: Diagnosis not present

## 2021-11-24 DIAGNOSIS — M5459 Other low back pain: Secondary | ICD-10-CM | POA: Diagnosis not present

## 2021-11-24 DIAGNOSIS — M542 Cervicalgia: Secondary | ICD-10-CM | POA: Diagnosis not present

## 2021-11-24 DIAGNOSIS — M5136 Other intervertebral disc degeneration, lumbar region: Secondary | ICD-10-CM | POA: Diagnosis not present

## 2021-11-24 DIAGNOSIS — M5412 Radiculopathy, cervical region: Secondary | ICD-10-CM | POA: Diagnosis not present

## 2021-11-24 DIAGNOSIS — Z79891 Long term (current) use of opiate analgesic: Secondary | ICD-10-CM | POA: Diagnosis not present

## 2021-12-07 DIAGNOSIS — Z20822 Contact with and (suspected) exposure to covid-19: Secondary | ICD-10-CM | POA: Diagnosis not present

## 2021-12-08 DIAGNOSIS — Z20822 Contact with and (suspected) exposure to covid-19: Secondary | ICD-10-CM | POA: Diagnosis not present

## 2021-12-08 DIAGNOSIS — Z20828 Contact with and (suspected) exposure to other viral communicable diseases: Secondary | ICD-10-CM | POA: Diagnosis not present

## 2021-12-15 DIAGNOSIS — Z20822 Contact with and (suspected) exposure to covid-19: Secondary | ICD-10-CM | POA: Diagnosis not present

## 2021-12-31 DIAGNOSIS — M5412 Radiculopathy, cervical region: Secondary | ICD-10-CM | POA: Diagnosis not present

## 2021-12-31 DIAGNOSIS — M5136 Other intervertebral disc degeneration, lumbar region: Secondary | ICD-10-CM | POA: Diagnosis not present

## 2021-12-31 DIAGNOSIS — G894 Chronic pain syndrome: Secondary | ICD-10-CM | POA: Diagnosis not present

## 2021-12-31 DIAGNOSIS — M5459 Other low back pain: Secondary | ICD-10-CM | POA: Diagnosis not present

## 2022-01-03 DIAGNOSIS — Z20822 Contact with and (suspected) exposure to covid-19: Secondary | ICD-10-CM | POA: Diagnosis not present

## 2022-01-06 DIAGNOSIS — Z20822 Contact with and (suspected) exposure to covid-19: Secondary | ICD-10-CM | POA: Diagnosis not present

## 2022-01-11 DIAGNOSIS — M25512 Pain in left shoulder: Secondary | ICD-10-CM | POA: Diagnosis not present

## 2022-01-11 DIAGNOSIS — Z20822 Contact with and (suspected) exposure to covid-19: Secondary | ICD-10-CM | POA: Diagnosis not present

## 2022-01-11 DIAGNOSIS — M25551 Pain in right hip: Secondary | ICD-10-CM | POA: Diagnosis not present

## 2022-01-12 DIAGNOSIS — L570 Actinic keratosis: Secondary | ICD-10-CM | POA: Diagnosis not present

## 2022-01-12 DIAGNOSIS — L578 Other skin changes due to chronic exposure to nonionizing radiation: Secondary | ICD-10-CM | POA: Diagnosis not present

## 2022-01-12 DIAGNOSIS — L821 Other seborrheic keratosis: Secondary | ICD-10-CM | POA: Diagnosis not present

## 2022-01-14 DIAGNOSIS — Z23 Encounter for immunization: Secondary | ICD-10-CM | POA: Diagnosis not present

## 2022-01-17 DIAGNOSIS — Z20822 Contact with and (suspected) exposure to covid-19: Secondary | ICD-10-CM | POA: Diagnosis not present

## 2022-01-18 DIAGNOSIS — Z20822 Contact with and (suspected) exposure to covid-19: Secondary | ICD-10-CM | POA: Diagnosis not present

## 2022-02-27 DIAGNOSIS — G894 Chronic pain syndrome: Secondary | ICD-10-CM | POA: Diagnosis not present

## 2022-02-27 DIAGNOSIS — M5136 Other intervertebral disc degeneration, lumbar region: Secondary | ICD-10-CM | POA: Diagnosis not present

## 2022-02-27 DIAGNOSIS — Z79891 Long term (current) use of opiate analgesic: Secondary | ICD-10-CM | POA: Diagnosis not present

## 2022-02-27 DIAGNOSIS — M5412 Radiculopathy, cervical region: Secondary | ICD-10-CM | POA: Diagnosis not present

## 2022-03-16 DIAGNOSIS — M5412 Radiculopathy, cervical region: Secondary | ICD-10-CM | POA: Diagnosis not present

## 2022-03-17 ENCOUNTER — Inpatient Hospital Stay (HOSPITAL_COMMUNITY)
Admission: AD | Disposition: A | Discharge status: Home / Self Care | Payer: Self-pay | Source: Other Acute Inpatient Hospital | Attending: Internal Medicine

## 2022-03-17 ENCOUNTER — Inpatient Hospital Stay (HOSPITAL_COMMUNITY)
Admission: AD | Admit: 2022-03-17 | Discharge: 2022-03-21 | DRG: 246 | Disposition: A | Discharge status: Home / Self Care | Payer: Medicare Other | Source: Other Acute Inpatient Hospital | Attending: Internal Medicine | Admitting: Internal Medicine

## 2022-03-17 DIAGNOSIS — I5041 Acute combined systolic (congestive) and diastolic (congestive) heart failure: Secondary | ICD-10-CM | POA: Diagnosis not present

## 2022-03-17 DIAGNOSIS — G4730 Sleep apnea, unspecified: Secondary | ICD-10-CM | POA: Diagnosis present

## 2022-03-17 DIAGNOSIS — I25810 Atherosclerosis of coronary artery bypass graft(s) without angina pectoris: Secondary | ICD-10-CM | POA: Diagnosis not present

## 2022-03-17 DIAGNOSIS — I25708 Atherosclerosis of coronary artery bypass graft(s), unspecified, with other forms of angina pectoris: Secondary | ICD-10-CM | POA: Diagnosis not present

## 2022-03-17 DIAGNOSIS — F17210 Nicotine dependence, cigarettes, uncomplicated: Secondary | ICD-10-CM | POA: Diagnosis not present

## 2022-03-17 DIAGNOSIS — Z955 Presence of coronary angioplasty implant and graft: Secondary | ICD-10-CM

## 2022-03-17 DIAGNOSIS — G629 Polyneuropathy, unspecified: Secondary | ICD-10-CM | POA: Diagnosis present

## 2022-03-17 DIAGNOSIS — I255 Ischemic cardiomyopathy: Secondary | ICD-10-CM | POA: Diagnosis not present

## 2022-03-17 DIAGNOSIS — Z7902 Long term (current) use of antithrombotics/antiplatelets: Secondary | ICD-10-CM | POA: Diagnosis not present

## 2022-03-17 DIAGNOSIS — I083 Combined rheumatic disorders of mitral, aortic and tricuspid valves: Secondary | ICD-10-CM | POA: Diagnosis not present

## 2022-03-17 DIAGNOSIS — Z7982 Long term (current) use of aspirin: Secondary | ICD-10-CM

## 2022-03-17 DIAGNOSIS — J449 Chronic obstructive pulmonary disease, unspecified: Secondary | ICD-10-CM | POA: Diagnosis present

## 2022-03-17 DIAGNOSIS — G8929 Other chronic pain: Secondary | ICD-10-CM | POA: Diagnosis present

## 2022-03-17 DIAGNOSIS — I252 Old myocardial infarction: Secondary | ICD-10-CM | POA: Diagnosis not present

## 2022-03-17 DIAGNOSIS — R001 Bradycardia, unspecified: Secondary | ICD-10-CM | POA: Diagnosis not present

## 2022-03-17 DIAGNOSIS — I110 Hypertensive heart disease with heart failure: Secondary | ICD-10-CM | POA: Diagnosis present

## 2022-03-17 DIAGNOSIS — I214 Non-ST elevation (NSTEMI) myocardial infarction: Secondary | ICD-10-CM | POA: Diagnosis not present

## 2022-03-17 DIAGNOSIS — Z888 Allergy status to other drugs, medicaments and biological substances status: Secondary | ICD-10-CM

## 2022-03-17 DIAGNOSIS — Z951 Presence of aortocoronary bypass graft: Secondary | ICD-10-CM | POA: Diagnosis not present

## 2022-03-17 DIAGNOSIS — Z720 Tobacco use: Secondary | ICD-10-CM | POA: Diagnosis present

## 2022-03-17 DIAGNOSIS — F419 Anxiety disorder, unspecified: Secondary | ICD-10-CM | POA: Diagnosis present

## 2022-03-17 DIAGNOSIS — R079 Chest pain, unspecified: Secondary | ICD-10-CM | POA: Diagnosis not present

## 2022-03-17 DIAGNOSIS — I2510 Atherosclerotic heart disease of native coronary artery without angina pectoris: Secondary | ICD-10-CM | POA: Diagnosis not present

## 2022-03-17 DIAGNOSIS — Z880 Allergy status to penicillin: Secondary | ICD-10-CM | POA: Diagnosis not present

## 2022-03-17 DIAGNOSIS — E782 Mixed hyperlipidemia: Secondary | ICD-10-CM | POA: Diagnosis not present

## 2022-03-17 DIAGNOSIS — I10 Essential (primary) hypertension: Secondary | ICD-10-CM | POA: Diagnosis not present

## 2022-03-17 DIAGNOSIS — E785 Hyperlipidemia, unspecified: Secondary | ICD-10-CM | POA: Diagnosis not present

## 2022-03-17 DIAGNOSIS — K219 Gastro-esophageal reflux disease without esophagitis: Secondary | ICD-10-CM | POA: Diagnosis present

## 2022-03-17 DIAGNOSIS — Z8551 Personal history of malignant neoplasm of bladder: Secondary | ICD-10-CM | POA: Diagnosis not present

## 2022-03-17 DIAGNOSIS — Z8249 Family history of ischemic heart disease and other diseases of the circulatory system: Secondary | ICD-10-CM | POA: Diagnosis not present

## 2022-03-17 DIAGNOSIS — T82855A Stenosis of coronary artery stent, initial encounter: Secondary | ICD-10-CM | POA: Diagnosis not present

## 2022-03-17 DIAGNOSIS — Z86711 Personal history of pulmonary embolism: Secondary | ICD-10-CM | POA: Diagnosis not present

## 2022-03-17 HISTORY — PX: LEFT HEART CATH AND CORS/GRAFTS ANGIOGRAPHY: CATH118250

## 2022-03-17 HISTORY — PX: CORONARY/GRAFT ACUTE MI REVASCULARIZATION: CATH118305

## 2022-03-17 HISTORY — DX: Non-ST elevation (NSTEMI) myocardial infarction: I21.4

## 2022-03-17 LAB — COMPREHENSIVE METABOLIC PANEL
ALT: 21 U/L (ref 0–44)
AST: 94 U/L — ABNORMAL HIGH (ref 15–41)
Albumin: 3.5 g/dL (ref 3.5–5.0)
Alkaline Phosphatase: 52 U/L (ref 38–126)
Anion gap: 10 (ref 5–15)
BUN: 10 mg/dL (ref 8–23)
CO2: 27 mmol/L (ref 22–32)
Calcium: 9.2 mg/dL (ref 8.9–10.3)
Chloride: 105 mmol/L (ref 98–111)
Creatinine, Ser: 0.92 mg/dL (ref 0.61–1.24)
GFR, Estimated: >60 mL/min (ref >60)
Glucose, Bld: 108 mg/dL — ABNORMAL HIGH (ref 70–99)
Potassium: 3.9 mmol/L (ref 3.5–5.1)
Sodium: 142 mmol/L (ref 135–145)
Total Bilirubin: 1.1 mg/dL (ref 0.3–1.2)
Total Protein: 6.2 g/dL — ABNORMAL LOW (ref 6.5–8.1)

## 2022-03-17 LAB — CBC WITH DIFFERENTIAL/PLATELET
Abs Immature Granulocytes: 0.05 10*3/uL (ref 0.00–0.07)
Basophils Absolute: 0 10*3/uL (ref 0.0–0.1)
Basophils Relative: 0 %
Eosinophils Absolute: 0.1 10*3/uL (ref 0.0–0.5)
Eosinophils Relative: 0 %
HCT: 43.2 % (ref 39.0–52.0)
Hemoglobin: 14.4 g/dL (ref 13.0–17.0)
Immature Granulocytes: 0 %
Lymphocytes Relative: 18 %
Lymphs Abs: 2.2 10*3/uL (ref 0.7–4.0)
MCH: 32.2 pg (ref 26.0–34.0)
MCHC: 33.3 g/dL (ref 30.0–36.0)
MCV: 96.6 fL (ref 80.0–100.0)
Monocytes Absolute: 0.9 10*3/uL (ref 0.1–1.0)
Monocytes Relative: 8 %
Neutro Abs: 8.5 10*3/uL — ABNORMAL HIGH (ref 1.7–7.7)
Neutrophils Relative %: 74 %
Platelets: 236 10*3/uL (ref 150–400)
RBC: 4.47 MIL/uL (ref 4.22–5.81)
RDW: 13.1 % (ref 11.5–15.5)
WBC: 11.7 10*3/uL — ABNORMAL HIGH (ref 4.0–10.5)
nRBC: 0 % (ref 0.0–0.2)

## 2022-03-17 LAB — HEPARIN LEVEL (UNFRACTIONATED): Heparin Unfractionated: 0.3 IU/mL (ref 0.30–0.70)

## 2022-03-17 LAB — TROPONIN I (HIGH SENSITIVITY)
Troponin I (High Sensitivity): 6281 ng/L (ref <18)
Troponin I (High Sensitivity): 6993 ng/L (ref <18)

## 2022-03-17 LAB — POCT ACTIVATED CLOTTING TIME: Activated Clotting Time: 347 seconds

## 2022-03-17 LAB — LIPASE, BLOOD: Lipase: 33 U/L (ref 11–51)

## 2022-03-17 LAB — AMYLASE: Amylase: 48 U/L (ref 28–100)

## 2022-03-17 SURGERY — CORONARY/GRAFT ACUTE MI REVASCULARIZATION
Anesthesia: LOCAL

## 2022-03-17 MED ORDER — OXYCODONE HCL 5 MG PO TABS
5.0000 mg | ORAL_TABLET | ORAL | Status: DC | PRN
  Administered 2022-03-17 – 2022-03-18 (×3): 5 mg via ORAL
  Administered 2022-03-18 – 2022-03-21 (×10): 10 mg via ORAL
  Filled 2022-03-17 (×5): qty 2
  Filled 2022-03-17: qty 1
  Filled 2022-03-17 (×6): qty 2

## 2022-03-17 MED ORDER — SODIUM CHLORIDE 0.9 % IV SOLN: INTRAVENOUS | Status: AC

## 2022-03-17 MED ORDER — SODIUM CHLORIDE 0.9 % IV SOLN
INTRAVENOUS | Status: DC | PRN
  Administered 2022-03-17: 1.75 mg/kg/h via INTRAVENOUS

## 2022-03-17 MED ORDER — HEPARIN (PORCINE) IN NACL 1000-0.9 UT/500ML-% IV SOLN
INTRAVENOUS | Status: DC | PRN
  Administered 2022-03-17 (×2): 500 mL

## 2022-03-17 MED ORDER — HEPARIN (PORCINE) IN NACL 1000-0.9 UT/500ML-% IV SOLN
INTRAVENOUS | Status: AC
Start: 2022-03-17 — End: ?
  Filled 2022-03-17: qty 500

## 2022-03-17 MED ORDER — HYDRALAZINE HCL 20 MG/ML IJ SOLN
10.0000 mg | INTRAMUSCULAR | Status: AC | PRN
Start: 2022-03-17 — End: 2022-03-18

## 2022-03-17 MED ORDER — NITROGLYCERIN 0.4 MG SL SUBL
SUBLINGUAL_TABLET | SUBLINGUAL | Status: AC
  Administered 2022-03-17: 0.4 mg via SUBLINGUAL
  Filled 2022-03-17: qty 1

## 2022-03-17 MED ORDER — CLOPIDOGREL BISULFATE 75 MG PO TABS
75.0000 mg | ORAL_TABLET | Freq: Every day | ORAL | Status: DC
  Administered 2022-03-18 – 2022-03-21 (×4): 75 mg via ORAL
  Filled 2022-03-17 (×4): qty 1

## 2022-03-17 MED ORDER — MORPHINE SULFATE (PF) 2 MG/ML IV SOLN
1.0000 mg | Freq: Once | INTRAVENOUS | Status: AC
  Administered 2022-03-17: 1 mg via INTRAVENOUS
  Filled 2022-03-17: qty 1

## 2022-03-17 MED ORDER — MORPHINE SULFATE (PF) 2 MG/ML IV SOLN: 1.0000 mg | INTRAVENOUS | Status: DC | PRN

## 2022-03-17 MED ORDER — LIDOCAINE HCL (PF) 1 % IJ SOLN
INTRAMUSCULAR | Status: DC | PRN
  Administered 2022-03-17: 20 mL via INTRADERMAL

## 2022-03-17 MED ORDER — CLOPIDOGREL BISULFATE 300 MG PO TABS
ORAL_TABLET | ORAL | Status: AC
Start: 2022-03-17 — End: ?
  Filled 2022-03-17: qty 2

## 2022-03-17 MED ORDER — NITROGLYCERIN 0.4 MG SL SUBL: 0.4000 mg | SUBLINGUAL_TABLET | SUBLINGUAL | Status: DC | PRN

## 2022-03-17 MED ORDER — VERAPAMIL HCL 2.5 MG/ML IV SOLN
INTRAVENOUS | Status: AC
Start: 2022-03-17 — End: ?
  Filled 2022-03-17: qty 2

## 2022-03-17 MED ORDER — ONDANSETRON HCL 4 MG/2ML IJ SOLN: 4.0000 mg | Freq: Four times a day (QID) | INTRAMUSCULAR | Status: DC | PRN

## 2022-03-17 MED ORDER — BIVALIRUDIN BOLUS VIA INFUSION - CUPID
INTRAVENOUS | Status: DC | PRN
  Administered 2022-03-17: 56.7 mg via INTRAVENOUS

## 2022-03-17 MED ORDER — ASPIRIN 81 MG PO TBEC: 81.0000 mg | DELAYED_RELEASE_TABLET | Freq: Every day | ORAL | Status: DC

## 2022-03-17 MED ORDER — NITROGLYCERIN IN D5W 200-5 MCG/ML-% IV SOLN: 0.0000 ug/min | INTRAVENOUS | Status: DC

## 2022-03-17 MED ORDER — ACETAMINOPHEN 325 MG PO TABS: 650.0000 mg | ORAL_TABLET | ORAL | Status: DC | PRN

## 2022-03-17 MED ORDER — ASPIRIN 81 MG PO CHEW
81.0000 mg | CHEWABLE_TABLET | Freq: Every day | ORAL | Status: DC
  Administered 2022-03-18 – 2022-03-21 (×3): 81 mg via ORAL
  Filled 2022-03-17 (×3): qty 1

## 2022-03-17 MED ORDER — EZETIMIBE 10 MG PO TABS
10.0000 mg | ORAL_TABLET | Freq: Every day | ORAL | Status: DC
  Administered 2022-03-18 – 2022-03-21 (×4): 10 mg via ORAL
  Filled 2022-03-17 (×4): qty 1

## 2022-03-17 MED ORDER — SODIUM CHLORIDE 0.9 % IV SOLN: 250.0000 mL | INTRAVENOUS | Status: DC | PRN

## 2022-03-17 MED ORDER — HEPARIN (PORCINE) 25000 UT/250ML-% IV SOLN
850.0000 [IU]/h | INTRAVENOUS | Status: DC
  Administered 2022-03-17: 850 [IU]/h via INTRAVENOUS

## 2022-03-17 MED ORDER — FENTANYL CITRATE (PF) 100 MCG/2ML IJ SOLN
INTRAMUSCULAR | Status: DC | PRN
  Administered 2022-03-17: 50 ug via INTRAVENOUS

## 2022-03-17 MED ORDER — SODIUM CHLORIDE 0.9% FLUSH: 3.0000 mL | INTRAVENOUS | Status: DC | PRN

## 2022-03-17 MED ORDER — LABETALOL HCL 5 MG/ML IV SOLN: 10.0000 mg | INTRAVENOUS | Status: AC | PRN

## 2022-03-17 MED ORDER — SODIUM CHLORIDE 0.9 % IV SOLN
INTRAVENOUS | Status: AC | PRN
  Administered 2022-03-17: 250 mL via INTRAVENOUS

## 2022-03-17 MED ORDER — HEPARIN SODIUM (PORCINE) 5000 UNIT/ML IJ SOLN
5000.0000 [IU] | Freq: Three times a day (TID) | INTRAMUSCULAR | Status: DC
  Administered 2022-03-18 – 2022-03-19 (×6): 5000 [IU] via SUBCUTANEOUS
  Filled 2022-03-17 (×6): qty 1

## 2022-03-17 MED ORDER — NITROGLYCERIN IN D5W 200-5 MCG/ML-% IV SOLN
INTRAVENOUS | Status: AC
  Administered 2022-03-17: 5 ug/min via INTRAVENOUS
  Filled 2022-03-17: qty 250

## 2022-03-17 MED ORDER — CLOPIDOGREL BISULFATE 300 MG PO TABS
ORAL_TABLET | ORAL | Status: DC | PRN
  Administered 2022-03-17: 600 mg via ORAL

## 2022-03-17 MED ORDER — HEPARIN SODIUM (PORCINE) 1000 UNIT/ML IJ SOLN
INTRAMUSCULAR | Status: AC
  Filled 2022-03-17: qty 10

## 2022-03-17 MED ORDER — NITROGLYCERIN 1 MG/10 ML FOR IR/CATH LAB
INTRA_ARTERIAL | Status: AC
  Filled 2022-03-17: qty 10

## 2022-03-17 MED ORDER — FENTANYL CITRATE (PF) 100 MCG/2ML IJ SOLN
INTRAMUSCULAR | Status: AC
  Filled 2022-03-17: qty 2

## 2022-03-17 MED ORDER — MORPHINE SULFATE (PF) 2 MG/ML IV SOLN: 2.0000 mg | INTRAVENOUS | Status: DC | PRN

## 2022-03-17 MED ORDER — ONDANSETRON HCL 4 MG/2ML IJ SOLN
INTRAMUSCULAR | Status: AC
  Filled 2022-03-17: qty 2

## 2022-03-17 MED ORDER — NITROGLYCERIN 0.4 MG SL SUBL
0.4000 mg | SUBLINGUAL_TABLET | SUBLINGUAL | Status: DC | PRN
  Administered 2022-03-17: 0.4 mg via SUBLINGUAL

## 2022-03-17 MED ORDER — SODIUM CHLORIDE 0.9% FLUSH
3.0000 mL | Freq: Two times a day (BID) | INTRAVENOUS | Status: DC
  Administered 2022-03-18 – 2022-03-19 (×3): 3 mL via INTRAVENOUS

## 2022-03-17 MED ORDER — LIDOCAINE HCL (PF) 1 % IJ SOLN
INTRAMUSCULAR | Status: AC
  Filled 2022-03-17: qty 30

## 2022-03-17 MED ORDER — IOHEXOL 350 MG/ML SOLN
INTRAVENOUS | Status: DC | PRN
  Administered 2022-03-17: 185 mL

## 2022-03-17 MED ORDER — NITROGLYCERIN 1 MG/10 ML FOR IR/CATH LAB
INTRA_ARTERIAL | Status: DC | PRN
  Administered 2022-03-17: 200 ug via INTRACORONARY

## 2022-03-17 SURGICAL SUPPLY — 21 items
BALLN SAPPHIRE 2.0X12 (BALLOONS) ×2
BALLOON SAPPHIRE 2.0X12 (BALLOONS) IMPLANT
CATH INFINITI 5 FR IM (CATHETERS) ×1 IMPLANT
CATH INFINITI 5FR MPB2 (CATHETERS) ×1 IMPLANT
CATH INFINITI 6F ANG MULTIPACK (CATHETERS) ×1 IMPLANT
CATH VISTA GUIDE 6FR XB4 (CATHETERS) ×1 IMPLANT
GLIDESHEATH SLEND A-KIT 6F 22G (SHEATH) ×1 IMPLANT
GUIDEWIRE INQWIRE 1.5J.035X260 (WIRE) IMPLANT
INQWIRE 1.5J .035X260CM (WIRE) ×2
KIT ENCORE 26 ADVANTAGE (KITS) ×1 IMPLANT
KIT HEART LEFT (KITS) ×2 IMPLANT
KIT MICROPUNCTURE NIT STIFF (SHEATH) ×1 IMPLANT
PACK CARDIAC CATHETERIZATION (CUSTOM PROCEDURE TRAY) ×2 IMPLANT
SHEATH PINNACLE 5F 10CM (SHEATH) ×1 IMPLANT
SHEATH PINNACLE 6F 10CM (SHEATH) ×1 IMPLANT
SHEATH PROBE COVER 6X72 (BAG) ×1 IMPLANT
STENT ONYX FRONTIER 2.25X26 (Permanent Stent) ×1 IMPLANT
TRANSDUCER W/STOPCOCK (MISCELLANEOUS) ×2 IMPLANT
TUBING CIL FLEX 10 FLL-RA (TUBING) ×2 IMPLANT
WIRE ASAHI PROWATER 180CM (WIRE) ×1 IMPLANT
WIRE EMERALD 3MM-J .035X150CM (WIRE) ×1 IMPLANT

## 2022-03-17 NOTE — Progress Notes (Signed)
   03/17/22 2042  Clinical Encounter Type  Visited With Patient not available  Visit Type Initial;Other (Comment) (STEMI)  Referral From Nurse  Consult/Referral To Chaplain   Chaplain responded to a STEMI alert. Patient was under the care of the medical team.  No family present. If a chaplain is requested someone will respond.   Danice Goltz [facility]  [phone]
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
## 2022-03-17 NOTE — CV Procedure (Signed)
60 to 70% distal left main unchanged from 2014 appearance. LAD is stented from proximal to mid.  It crosses over diagonal which contains a mid vessel stent. Native circumflex is totally occluded and has appearance suggesting recent total occlusion.  With ongoing chest discomfort, suspect as culprit. Patent heavily stented right coronary with mid eccentric 75% in-stent restenosis. Occluded saphenous vein graft to the PDA Patent vein graft to OM1.  OM 2 limb of the Y graft is occluded.  The mid body of the graft contains relatively focal 70 to 80% stenosis. Atretic LIMA to LAD Proximal circumflex stent extending into the second obtuse marginal branch and in-stent restenosis was dilated acutely and then treated with a 26 x 2.25 mm Onyx stent reducing 100% stenosis to 0% with TIMI grade III flow.  Loss of small continuation branch after being jailed by the stent. LVEF 45 to 50%.  LVEDP 24 mmHg.  Patient is able to lie flat without dyspnea.

## 2022-03-17 NOTE — Progress Notes (Signed)
Date and time results received: 03/17/22 (use smartphrase ".now" to insert current time)  Test: Troponin Critical Value: [id number]  Name of Provider Notified: Gaynelle Arabian  Orders Received? Or Actions Taken?: Actions Taken:

## 2022-03-17 NOTE — Progress Notes (Signed)
Patient has blood pressure of 83/36 with nitro drip. Attending Vikki Ports, PA notified via secure chat.

## 2022-03-18 ENCOUNTER — Encounter (HOSPITAL_COMMUNITY): Payer: Self-pay | Admitting: Internal Medicine

## 2022-03-18 ENCOUNTER — Other Ambulatory Visit: Payer: Self-pay

## 2022-03-18 ENCOUNTER — Other Ambulatory Visit (HOSPITAL_COMMUNITY): Payer: Medicare Other

## 2022-03-18 DIAGNOSIS — I214 Non-ST elevation (NSTEMI) myocardial infarction: Secondary | ICD-10-CM | POA: Diagnosis not present

## 2022-03-18 LAB — CBC
HCT: 40.3 % (ref 39.0–52.0)
Hemoglobin: 13.9 g/dL (ref 13.0–17.0)
MCH: 33.1 pg (ref 26.0–34.0)
MCHC: 34.5 g/dL (ref 30.0–36.0)
MCV: 96 fL (ref 80.0–100.0)
Platelets: 206 10*3/uL (ref 150–400)
RBC: 4.2 MIL/uL — ABNORMAL LOW (ref 4.22–5.81)
RDW: 13.3 % (ref 11.5–15.5)
WBC: 10.7 10*3/uL — ABNORMAL HIGH (ref 4.0–10.5)
nRBC: 0 % (ref 0.0–0.2)

## 2022-03-18 LAB — BASIC METABOLIC PANEL
Anion gap: 13 (ref 5–15)
BUN: 13 mg/dL (ref 8–23)
CO2: 22 mmol/L (ref 22–32)
Calcium: 8.8 mg/dL — ABNORMAL LOW (ref 8.9–10.3)
Chloride: 104 mmol/L (ref 98–111)
Creatinine, Ser: 0.93 mg/dL (ref 0.61–1.24)
GFR, Estimated: >60 mL/min (ref >60)
Glucose, Bld: 93 mg/dL (ref 70–99)
Potassium: 3.6 mmol/L (ref 3.5–5.1)
Sodium: 139 mmol/L (ref 135–145)

## 2022-03-18 LAB — LIPID PANEL
Cholesterol: 216 mg/dL — ABNORMAL HIGH (ref 0–200)
HDL: 32 mg/dL — ABNORMAL LOW (ref >40)
LDL Cholesterol: 148 mg/dL — ABNORMAL HIGH (ref 0–99)
Total CHOL/HDL Ratio: 6.8 RATIO
Triglycerides: 178 mg/dL — ABNORMAL HIGH (ref <150)
VLDL: 36 mg/dL (ref 0–40)

## 2022-03-18 LAB — TROPONIN I (HIGH SENSITIVITY): Troponin I (High Sensitivity): 15198 ng/L (ref <18)

## 2022-03-18 LAB — POCT ACTIVATED CLOTTING TIME: Activated Clotting Time: 173 seconds

## 2022-03-18 MED ORDER — ATROPINE SULFATE 1 MG/10ML IJ SOSY
PREFILLED_SYRINGE | INTRAMUSCULAR | Status: AC
  Filled 2022-03-18: qty 10

## 2022-03-18 MED ORDER — SPIRONOLACTONE 12.5 MG HALF TABLET
12.5000 mg | ORAL_TABLET | Freq: Every day | ORAL | Status: DC
  Administered 2022-03-18: 12.5 mg via ORAL
  Filled 2022-03-18: qty 1

## 2022-03-18 MED ORDER — FUROSEMIDE 10 MG/ML IJ SOLN
40.0000 mg | Freq: Once | INTRAMUSCULAR | Status: AC
  Administered 2022-03-18: 40 mg via INTRAVENOUS
  Filled 2022-03-18: qty 4

## 2022-03-18 MED ORDER — CARVEDILOL 3.125 MG PO TABS: 3.1250 mg | ORAL_TABLET | Freq: Two times a day (BID) | ORAL | Status: DC

## 2022-03-18 MED ORDER — CHLORHEXIDINE GLUCONATE CLOTH 2 % EX PADS
6.0000 | MEDICATED_PAD | Freq: Every day | CUTANEOUS | Status: DC
  Administered 2022-03-18 – 2022-03-19 (×2): 6 via TOPICAL

## 2022-03-18 MED ORDER — DAPAGLIFLOZIN PROPANEDIOL 10 MG PO TABS
10.0000 mg | ORAL_TABLET | Freq: Every day | ORAL | Status: DC
  Administered 2022-03-18 – 2022-03-21 (×4): 10 mg via ORAL
  Filled 2022-03-18 (×5): qty 1

## 2022-03-18 NOTE — Progress Notes (Signed)
Sheath Removal Note  R fem arterial sheath removed per order at 0336. Manual pressure held for 84mn. Site level 0. VSS.  Pt tolerated fair; c/o chronic back/neck pain d/t laying flat >> treated by primary RN, see eMAR.   Pt educated regarding 6 hour bedrest and when to call nurse. Primary RN notified of removal completion.   AGeoffery Lyons RN RParis Lore RN, Qualified Observer  03/18/22 0400

## 2022-03-18 NOTE — Progress Notes (Signed)
Cardiology Progress Note  Patient ID: Erik Alvarado MRN: [id number] DOB: [birth year] Date of Encounter: 03/18/2022  Primary Cardiologist: Evalina Field, MD  Subjective   Chief Complaint: None.   HPI: PCI to LCX yesterday. Will need staged PCI to SVG-OM per IC noted. Femoral cath site clean and dry. No bleeding. No CP reported.   ROS:  All other ROS reviewed and negative. Pertinent positives noted in the HPI.     Inpatient Medications  Scheduled Meds:  aspirin  81 mg Oral Daily   Chlorhexidine Gluconate Cloth  6 each Topical Daily   clopidogrel  75 mg Oral Q breakfast   ezetimibe  10 mg Oral Daily   heparin  5,000 Units Subcutaneous Q8H   sodium chloride flush  3 mL Intravenous Q12H   Continuous Infusions:  sodium chloride     nitroGLYCERIN Stopped (03/17/22 1957)   PRN Meds: sodium chloride, acetaminophen, acetaminophen, morphine injection, morphine injection, nitroGLYCERIN, ondansetron (ZOFRAN) IV, oxyCODONE, sodium chloride flush   Vital Signs   Vitals:   03/18/22 0600 03/18/22 0700 03/18/22 0800 03/18/22 0900  BP: (!) 100/56 (!) 93/54 (!) 116/54 (!) 116/47  Pulse: 75 70 63 86  Resp: (!) 23 17 (!) 21 16  Temp:      TempSrc:      SpO2: 92% 95% 93% 94%  Weight:      Height:        Intake/Output Summary (Last 24 hours) at 03/18/2022 0911 Last data filed at 03/18/2022 0800 Gross per 24 hour  Intake 3372.64 ml  Output 1175 ml  Net 2197.64 ml      03/17/2022    5:30 PM 11/02/2020   11:16 AM 11/24/2019   10:34 AM  Last 3 Weights  Weight (lbs) 166 lb 10.7 oz 153 lb 3.2 oz 198 lb  Weight (kg) 75.6 kg 69.491 kg 89.812 kg      Telemetry  Overnight telemetry shows sinus bradycardia with PACs, which I personally reviewed.   ECG  The most recent ECG shows sinus bradycardia heart rate 52, right bundle branch block with left anterior fascicular block, which I personally reviewed.   Physical Exam   Vitals:   03/18/22 0600 03/18/22 0700 03/18/22 0800 03/18/22 0900   BP: (!) 100/56 (!) 93/54 (!) 116/54 (!) 116/47  Pulse: 75 70 63 86  Resp: (!) 23 17 (!) 21 16  Temp:      TempSrc:      SpO2: 92% 95% 93% 94%  Weight:      Height:        Intake/Output Summary (Last 24 hours) at 03/18/2022 0911 Last data filed at 03/18/2022 0800 Gross per 24 hour  Intake 3372.64 ml  Output 1175 ml  Net 2197.64 ml       03/17/2022    5:30 PM 11/02/2020   11:16 AM 11/24/2019   10:34 AM  Last 3 Weights  Weight (lbs) 166 lb 10.7 oz 153 lb 3.2 oz 198 lb  Weight (kg) 75.6 kg 69.491 kg 89.812 kg    Body mass index is 22.6 kg/m.  General: Well nourished, well developed, in no acute distress Head: Atraumatic, normal size  Eyes: PEERLA, EOMI  Neck: Supple, no JVD Endocrine: No thryomegaly Cardiac: Normal S1, S2; RRR; no murmurs, rubs, or gallops Lungs: Clear to auscultation bilaterally, no wheezing, rhonchi or rales  Abd: Soft, nontender, no hepatomegaly  Ext: No edema, pulses 2+, 2+ femoral pulse, no bruising or bleeding. Musculoskeletal: No deformities, BUE and BLE strength  normal and equal Skin: Warm and dry, no rashes   Neuro: Alert and oriented to person, place, time, and situation, CNII-XII grossly intact, no focal deficits  Psych: Normal mood and affect   Labs  High Sensitivity Troponin:   Recent Labs  Lab 03/17/22 1834 03/17/22 2101  TROPONINIHS 6,281* 8,366*     Cardiac EnzymesNo results for input(s): "TROPONINI" in the last 168 hours. No results for input(s): "TROPIPOC" in the last 168 hours.  Chemistry Recent Labs  Lab 03/17/22 1834 03/18/22 0327  NA 142 139  K 3.9 3.6  CL 105 104  CO2 27 22  GLUCOSE 108* 93  BUN 10 13  CREATININE 0.92 0.93  CALCIUM 9.2 8.8*  PROT 6.2*  --   ALBUMIN 3.5  --   AST 94*  --   ALT 21  --   ALKPHOS 52  --   BILITOT 1.1  --   GFRNONAA >60 >60  ANIONGAP 10 13    Hematology Recent Labs  Lab 03/17/22 1834 03/18/22 0327  WBC 11.7* 10.7*  RBC 4.47 4.20*  HGB 14.4 13.9  HCT 43.2 40.3  MCV 96.6 96.0  MCH  32.2 33.1  MCHC 33.3 34.5  RDW 13.1 13.3  PLT 236 206   BNPNo results for input(s): "BNP", "PROBNP" in the last 168 hours.  DDimer No results for input(s): "DDIMER" in the last 168 hours.   Radiology  CARDIAC CATHETERIZATION  Result Date: 03/17/2022 CONCLUSIONS: Occlusion of the proximal circumflex treated with angioplasty followed by stenting from the ostium to the mid second obtuse marginal covering in-stent restenosis in the mid second marginal.  Stent was 2.25 x 26 Onyx deployed at 16 atm x 2. 65 to 75% distal left main unchanged by description from 2017 angiography at [place] patent stent in the proximal to mid LAD and also in the first diagonal (which is jailed by the LAD stent). Patent RCA but with proximal to distal overlapping stents with multiple layers.  Also the site of prior brachytherapy dating back 20 years.  Mid to distal eccentric 70% in-stent restenosis is noted. Occluded saphenous vein graft to PDA Occluded limb of saphenous vein graft to the second obtuse marginal.  The main body of the saphenous vein graft to the first obtuse marginal contains a focal 90% stenosis.  Atretic LIMA. Low normal to mildly depressed LVEF, 45 to 50% with LVEDP 24 mmHg.  Consistent with acute diastolic heart failure RECOMMENDATIONS: Aspirin and Plavix Cycle markers 2D Doppler echocardiogram Anticipate stent of the focal 90% stenosis in the shaft of the SVG to OM1. Gentle hydration.  Watch for heart failure as LVEDP is elevated.  Consider starting SGLT2 therapy   Cardiac Studies  [facility] 03/17/2022 CONCLUSIONS: Occlusion of the proximal circumflex treated with angioplasty followed by stenting from the ostium to the mid second obtuse marginal covering in-stent restenosis in the mid second marginal.  Stent was 2.25 x 26 Onyx deployed at 16 atm x 2. 65 to 75% distal left main unchanged by description from 2017 angiography at [place] patent stent in the  proximal to mid LAD and also in the first diagonal (which is jailed by the LAD stent). Patent RCA but with proximal to distal overlapping stents with multiple layers.  Also the site of prior brachytherapy dating back 20 years.  Mid to distal eccentric 70% in-stent restenosis is noted. Occluded saphenous vein graft to PDA Occluded limb of saphenous vein graft to the second obtuse  marginal.  The main body of the saphenous vein graft to the first obtuse marginal contains a focal 90% stenosis.   Atretic LIMA. Low normal to mildly depressed LVEF, 45 to 50% with LVEDP 24 mmHg.  Consistent with acute diastolic heart failure   RECOMMENDATIONS:   Aspirin and Plavix Cycle markers 2D Doppler echocardiogram Anticipate stent of the focal 90% stenosis in the shaft of the SVG to OM1. Gentle hydration.  Watch for heart failure as LVEDP is elevated.  Consider starting SGLT2 therapy  Patient Profile  Erik Alvarado is a 71 y.o. male with CAD status post CABG, hyperlipidemia, tobacco abuse who was admitted on 03/17/2022 for non-STEMI.  Status post intervention to the native left circumflex.  Assessment & Plan   #NSTEMI -Status post CABG in the past. -LIMA to LAD is atretic. -Occluded vein graft to OM 2, RCA is occluded with an occluded vein graft to the RCA.  85% left main disease. -Intervention was pursued to the native left circumflex into an OM branch. -He does have disease in a vein graft to OM 2. -He has significant left main disease. -To me question would be to his intervention to left main warrant consideration.  We will have a discussion with interventional cardiology about this.  It appears that I see is recommended intervention to the vein graft to the OM 2 branch.  Can do this Monday. -Continue aspirin and Plavix.  Statin intolerant.  PCSK9 inhibitor intolerant.  On Zetia.  Likely would be a good candidate for Leqvio as an outpatient. -Bradycardia precludes beta-blocker. -EF at cath 45-50.   LVEDP was elevated.  Echo is pending.  Start Aldactone.  Give a one-time dose of Lasix.  Start [place]. -Denies chest pain.  Right femoral cath site clean and dry. -Will need discussion with interventional cardiology regarding appropriate neck steps.  #Systolic heart failure, EF 45 to 50% -Formal echo pending.  LVEDP was elevated at the time of cath.  Give Lasix 40 mg IV as a one-time dose.  Does not appear volume overloaded. -Start Aldactone 12.5 mg daily. -Holding beta-blocker due to bradycardia.  May challenge him tomorrow. -Start Farxiga 10 mg daily. -Add ARB or ARNI as we are able.  #HLD -Statin intolerant.  PCSK9 inhibitor intolerant.  On Zetia.  Outpatient consideration for Leqvio  #FEN -No intravenous fluids -Heart healthy diet -DVT PPx: Heparin -Code: Full -Dispo: will remain in ICU today. Transition to floor tomorrow.   For questions or updates, please contact [place] Please consult [email or web address] for contact info under   Signed, [place] Bells T. Audie Box, MD, [place]  03/18/2022 9:11 AM

## 2022-03-19 ENCOUNTER — Inpatient Hospital Stay (HOSPITAL_COMMUNITY): Payer: Medicare Other

## 2022-03-19 DIAGNOSIS — I2510 Atherosclerotic heart disease of native coronary artery without angina pectoris: Secondary | ICD-10-CM

## 2022-03-19 DIAGNOSIS — I214 Non-ST elevation (NSTEMI) myocardial infarction: Secondary | ICD-10-CM | POA: Diagnosis not present

## 2022-03-19 LAB — ECHOCARDIOGRAM COMPLETE
AR max vel: 1.18 cm2
AV Area VTI: 1.09 cm2
AV Area mean vel: 0.99 cm2
AV Mean grad: 19 mmHg
AV Peak grad: 27.5 mmHg
Ao pk vel: 2.62 m/s
Area-P 1/2: 2.22 cm2
Height: 72 in
S' Lateral: 2.9 cm
Weight: 2666.68 oz

## 2022-03-19 LAB — CBC
HCT: 43 % (ref 39.0–52.0)
Hemoglobin: 14.6 g/dL (ref 13.0–17.0)
MCH: 32.4 pg (ref 26.0–34.0)
MCHC: 34 g/dL (ref 30.0–36.0)
MCV: 95.6 fL (ref 80.0–100.0)
Platelets: 191 10*3/uL (ref 150–400)
RBC: 4.5 MIL/uL (ref 4.22–5.81)
RDW: 13 % (ref 11.5–15.5)
WBC: 8.1 10*3/uL (ref 4.0–10.5)
nRBC: 0 % (ref 0.0–0.2)

## 2022-03-19 LAB — BASIC METABOLIC PANEL
Anion gap: 10 (ref 5–15)
BUN: 14 mg/dL (ref 8–23)
CO2: 25 mmol/L (ref 22–32)
Calcium: 8.5 mg/dL — ABNORMAL LOW (ref 8.9–10.3)
Chloride: 103 mmol/L (ref 98–111)
Creatinine, Ser: 1.13 mg/dL (ref 0.61–1.24)
GFR, Estimated: >60 mL/min (ref >60)
Glucose, Bld: 162 mg/dL — ABNORMAL HIGH (ref 70–99)
Potassium: 3.2 mmol/L — ABNORMAL LOW (ref 3.5–5.1)
Sodium: 138 mmol/L (ref 135–145)

## 2022-03-19 LAB — HEMOGLOBIN A1C
Hgb A1c MFr Bld: 5.6 % (ref 4.8–5.6)
Mean Plasma Glucose: 114.02 mg/dL

## 2022-03-19 LAB — TSH: TSH: 4.983 u[IU]/mL — ABNORMAL HIGH (ref 0.350–4.500)

## 2022-03-19 MED ORDER — SODIUM CHLORIDE 0.9 % WEIGHT BASED INFUSION
1.0000 mL/kg/h | INTRAVENOUS | Status: DC
  Administered 2022-03-20: 1 mL/kg/h via INTRAVENOUS

## 2022-03-19 MED ORDER — SODIUM CHLORIDE 0.9 % WEIGHT BASED INFUSION
3.0000 mL/kg/h | INTRAVENOUS | Status: DC
  Administered 2022-03-20: 3 mL/kg/h via INTRAVENOUS

## 2022-03-19 MED ORDER — SODIUM CHLORIDE 0.9 % IV SOLN: 250.0000 mL | INTRAVENOUS | Status: DC | PRN

## 2022-03-19 MED ORDER — POTASSIUM CHLORIDE CRYS ER 20 MEQ PO TBCR
40.0000 meq | EXTENDED_RELEASE_TABLET | Freq: Once | ORAL | Status: AC
  Administered 2022-03-19: 40 meq via ORAL
  Filled 2022-03-19: qty 2

## 2022-03-19 MED ORDER — SODIUM CHLORIDE 0.9% FLUSH: 3.0000 mL | INTRAVENOUS | Status: DC | PRN

## 2022-03-19 MED ORDER — ASPIRIN 81 MG PO CHEW
81.0000 mg | CHEWABLE_TABLET | ORAL | Status: AC
  Administered 2022-03-20: 81 mg via ORAL
  Filled 2022-03-19: qty 1

## 2022-03-19 MED ORDER — SODIUM CHLORIDE 0.9% FLUSH
3.0000 mL | Freq: Two times a day (BID) | INTRAVENOUS | Status: DC
Start: 2022-03-19 — End: 2022-03-20
  Administered 2022-03-19: 3 mL via INTRAVENOUS

## 2022-03-19 NOTE — Progress Notes (Signed)
Pt arrived to 4E from [facility]. A&Ox4. CHG bath. VSS. Oriented to room and call light in reach.  Raelyn Number, RN

## 2022-03-19 NOTE — Progress Notes (Signed)
  Echocardiogram 2D Echocardiogram has been performed.  Merrie Roof F 03/19/2022, 9:22 AM

## 2022-03-19 NOTE — Progress Notes (Signed)
Cardiology Progress Note  Patient ID: Erik Alvarado MRN: [id number] DOB: 22-Feb-1951 Date of Encounter: 03/19/2022  Primary Cardiologist: Evalina Field, MD  Subjective   Chief Complaint: None.   HPI: Good diuresis.  BP is a bit soft.  No arrhythmias.  Remains with sinus bradycardia.  Denies any chest pain.  ROS:  All other ROS reviewed and negative. Pertinent positives noted in the HPI.     Inpatient Medications  Scheduled Meds:  aspirin  81 mg Oral Daily   Chlorhexidine Gluconate Cloth  6 each Topical Daily   clopidogrel  75 mg Oral Q breakfast   dapagliflozin propanediol  10 mg Oral Daily   ezetimibe  10 mg Oral Daily   heparin  5,000 Units Subcutaneous Q8H   sodium chloride flush  3 mL Intravenous Q12H   spironolactone  12.5 mg Oral Daily   Continuous Infusions:  sodium chloride     PRN Meds: sodium chloride, acetaminophen, acetaminophen, nitroGLYCERIN, ondansetron (ZOFRAN) IV, oxyCODONE, sodium chloride flush   Vital Signs   Vitals:   03/19/22 0400 03/19/22 0500 03/19/22 0600 03/19/22 0700  BP: (!) 74/34 (!) 72/43 (!) 87/52 (!) 84/66  Pulse: (!) 56 (!) 51 (!) 57 85  Resp: $Remo'17 19 20 17  'OnyfF$ Temp:      TempSrc:      SpO2: (!) 89% (!) 89% 90% (!) 88%  Weight:      Height:        Intake/Output Summary (Last 24 hours) at 03/19/2022 0743 Last data filed at 03/19/2022 0600 Gross per 24 hour  Intake 360 ml  Output 2575 ml  Net -2215 ml      03/17/2022    5:30 PM 11/02/2020   11:16 AM 11/24/2019   10:34 AM  Last 3 Weights  Weight (lbs) 166 lb 10.7 oz 153 lb 3.2 oz 198 lb  Weight (kg) 75.6 kg 69.491 kg 89.812 kg      Telemetry  Overnight telemetry shows SB 50s with PACs, which I personally reviewed.   ECG  The most recent ECG shows sinus bradycardia heart rate 54, nonspecific ST-T changes, right bundle branch block, which I personally reviewed.   Physical Exam   Vitals:   03/19/22 0400 03/19/22 0500 03/19/22 0600 03/19/22 0700  BP: (!) 74/34 (!) 72/43 (!)  87/52 (!) 84/66  Pulse: (!) 56 (!) 51 (!) 57 85  Resp: $Remo'17 19 20 17  'aQlFb$ Temp:      TempSrc:      SpO2: (!) 89% (!) 89% 90% (!) 88%  Weight:      Height:        Intake/Output Summary (Last 24 hours) at 03/19/2022 0743 Last data filed at 03/19/2022 0600 Gross per 24 hour  Intake 360 ml  Output 2575 ml  Net -2215 ml       03/17/2022    5:30 PM 11/02/2020   11:16 AM 11/24/2019   10:34 AM  Last 3 Weights  Weight (lbs) 166 lb 10.7 oz 153 lb 3.2 oz 198 lb  Weight (kg) 75.6 kg 69.491 kg 89.812 kg    Body mass index is 22.6 kg/m.  General: Well nourished, well developed, in no acute distress Head: Atraumatic, normal size  Eyes: PEERLA, EOMI  Neck: Supple, no JVD Endocrine: No thryomegaly Cardiac: Normal S1, S2; RRR; no murmurs, rubs, or gallops Lungs: Clear to auscultation bilaterally, no wheezing, rhonchi or rales  Abd: Soft, nontender, no hepatomegaly  Ext: No edema, pulses 2+, right femoral cath site 2+,  no hematoma or bruit Musculoskeletal: No deformities, BUE and BLE strength normal and equal Skin: Warm and dry, no rashes   Neuro: Alert and oriented to person, place, time, and situation, CNII-XII grossly intact, no focal deficits  Psych: Normal mood and affect   Labs  High Sensitivity Troponin:   Recent Labs  Lab 03/17/22 1834 03/17/22 2101 03/18/22 1222  TROPONINIHS 6,281* 2,800* 15,198*     Cardiac EnzymesNo results for input(s): "TROPONINI" in the last 168 hours. No results for input(s): "TROPIPOC" in the last 168 hours.  Chemistry Recent Labs  Lab 03/17/22 1834 03/18/22 0327  NA 142 139  K 3.9 3.6  CL 105 104  CO2 27 22  GLUCOSE 108* 93  BUN 10 13  CREATININE 0.92 0.93  CALCIUM 9.2 8.8*  PROT 6.2*  --   ALBUMIN 3.5  --   AST 94*  --   ALT 21  --   ALKPHOS 52  --   BILITOT 1.1  --   GFRNONAA >60 >60  ANIONGAP 10 13    Hematology Recent Labs  Lab 03/17/22 1834 03/18/22 0327  WBC 11.7* 10.7*  RBC 4.47 4.20*  HGB 14.4 13.9  HCT 43.2 40.3  MCV 96.6 96.0   MCH 32.2 33.1  MCHC 33.3 34.5  RDW 13.1 13.3  PLT 236 206   BNPNo results for input(s): "BNP", "PROBNP" in the last 168 hours.  DDimer No results for input(s): "DDIMER" in the last 168 hours.   Radiology  CARDIAC CATHETERIZATION  Result Date: 03/17/2022 CONCLUSIONS: Occlusion of the proximal circumflex treated with angioplasty followed by stenting from the ostium to the mid second obtuse marginal covering in-stent restenosis in the mid second marginal.  Stent was 2.25 x 26 Onyx deployed at 16 atm x 2. 65 to 75% distal left main unchanged by description from 2017 angiography at [place] patent stent in the proximal to mid LAD and also in the first diagonal (which is jailed by the LAD stent). Patent RCA but with proximal to distal overlapping stents with multiple layers.  Also the site of prior brachytherapy dating back 20 years.  Mid to distal eccentric 70% in-stent restenosis is noted. Occluded saphenous vein graft to PDA Occluded limb of saphenous vein graft to the second obtuse marginal.  The main body of the saphenous vein graft to the first obtuse marginal contains a focal 90% stenosis.  Atretic LIMA. Low normal to mildly depressed LVEF, 45 to 50% with LVEDP 24 mmHg.  Consistent with acute diastolic heart failure RECOMMENDATIONS: Aspirin and Plavix Cycle markers 2D Doppler echocardiogram Anticipate stent of the focal 90% stenosis in the shaft of the SVG to OM1. Gentle hydration.  Watch for heart failure as LVEDP is elevated.  Consider starting SGLT2 therapy   Cardiac Studies  [facility] 03/17/2022 Occlusion of the proximal circumflex treated with angioplasty followed by stenting from the ostium to the mid second obtuse marginal covering in-stent restenosis in the mid second marginal.  Stent was 2.25 x 26 Onyx deployed at 16 atm x 2. 65 to 75% distal left main unchanged by description from 2017 angiography at [place] patent stent in the  proximal to mid LAD and also in the first diagonal (which is jailed by the LAD stent). Patent RCA but with proximal to distal overlapping stents with multiple layers.  Also the site of prior brachytherapy dating back 20 years.  Mid to distal eccentric 70% in-stent restenosis is noted. Occluded saphenous vein  graft to PDA Occluded limb of saphenous vein graft to the second obtuse marginal.  The main body of the saphenous vein graft to the first obtuse marginal contains a focal 90% stenosis.   Atretic LIMA. Low normal to mildly depressed LVEF, 45 to 50% with LVEDP 24 mmHg.  Consistent with acute diastolic heart failure  Patient Profile  Erik Alvarado is a 71 y.o. male with CAD status post CABG, hyperlipidemia, tobacco abuse who was admitted on 03/17/2022 for non-STEMI.  Status post intervention to the native left circumflex.  Assessment & Plan   #Non-STEMI -Status post intervention to the native left circumflex into OM branch.   -LIMA is atretic.  Occluded vein graft to RCA. -Residual disease in the vein graft to OM 2.  This needs staged intervention likely tomorrow. -Continue aspirin and Plavix.  Denies any recurrent chest pain episodes.  EKG nonischemic.  Intolerant of statins.  Zetia was added.  Intolerant of PCSK9 inhibitor therapy.  Would recommend possible Leqvio as an outpatient.  We can work with him on this. -No beta-blocker due to bradycardia. -Course also complicated by ischemic cardiomyopathy.  Given diuresis yesterday.  BP is a bit soft.  We will likely keep in the ICU today. -N.p.o. at midnight for likely staged intervention.  #Ischemic cardiomyopathy, EF 45 to 50% -Elevated LVEDP at the time of cath.  Echo is pending.  Given Lasix yesterday with nearly 2.5 L urine output.  Blood pressure is a bit soft today. -Holding beta-blocker due to bradycardia.  Hold Aldactone given hypotension. -I have encouraged p.o. intake.  We will reassess initiation of GDMT likely tomorrow.  Echo will  drive further recommendations. -Continue [place] -Discussed with nursing.  If BP is improved this morning can go out to the floor.  #Hyperlipidemia -Statin intolerant.  PCSK9 inhibitor intolerant.  On Zetia.  Would consider outpatient Leqvio.  #FEN -No intravenous fluids -Heart healthy diet, n.p.o. at midnight -DVT PPx: Heparin -Code: Full -Disposition: ICU today.  If BP improves can go to the floor.  Need staged intervention to the vein graft to OM per interventional cardiology.  For questions or updates, please contact [place] Please consult [email or web address] for contact info under   Signed, [place] B[place] T. Audie Box, MD, [place]  03/19/2022 7:43 AM

## 2022-03-20 ENCOUNTER — Other Ambulatory Visit (HOSPITAL_COMMUNITY): Payer: Self-pay

## 2022-03-20 ENCOUNTER — Encounter (HOSPITAL_COMMUNITY): Payer: Self-pay | Admitting: Interventional Cardiology

## 2022-03-20 ENCOUNTER — Inpatient Hospital Stay (HOSPITAL_COMMUNITY)
Admission: AD | Disposition: A | Discharge status: Home / Self Care | Payer: Self-pay | Source: Other Acute Inpatient Hospital | Attending: Internal Medicine

## 2022-03-20 DIAGNOSIS — I25708 Atherosclerosis of coronary artery bypass graft(s), unspecified, with other forms of angina pectoris: Secondary | ICD-10-CM

## 2022-03-20 DIAGNOSIS — I25810 Atherosclerosis of coronary artery bypass graft(s) without angina pectoris: Secondary | ICD-10-CM

## 2022-03-20 DIAGNOSIS — I214 Non-ST elevation (NSTEMI) myocardial infarction: Secondary | ICD-10-CM | POA: Diagnosis not present

## 2022-03-20 HISTORY — PX: CORONARY STENT INTERVENTION: CATH118234

## 2022-03-20 LAB — BASIC METABOLIC PANEL
Anion gap: 13 (ref 5–15)
BUN: 16 mg/dL (ref 8–23)
CO2: 26 mmol/L (ref 22–32)
Calcium: 8.9 mg/dL (ref 8.9–10.3)
Chloride: 101 mmol/L (ref 98–111)
Creatinine, Ser: 1.05 mg/dL (ref 0.61–1.24)
GFR, Estimated: >60 mL/min (ref >60)
Glucose, Bld: 94 mg/dL (ref 70–99)
Potassium: 4.1 mmol/L (ref 3.5–5.1)
Sodium: 140 mmol/L (ref 135–145)

## 2022-03-20 LAB — POCT ACTIVATED CLOTTING TIME: Activated Clotting Time: 395 seconds

## 2022-03-20 LAB — LIPOPROTEIN A (LPA): Lipoprotein (a): 134.2 nmol/L — ABNORMAL HIGH (ref <75.0)

## 2022-03-20 SURGERY — CORONARY STENT INTERVENTION
Anesthesia: LOCAL

## 2022-03-20 MED ORDER — VERAPAMIL HCL 2.5 MG/ML IV SOLN
INTRAVENOUS | Status: AC
  Filled 2022-03-20: qty 2

## 2022-03-20 MED ORDER — LABETALOL HCL 5 MG/ML IV SOLN: 10.0000 mg | INTRAVENOUS | Status: AC | PRN

## 2022-03-20 MED ORDER — LIDOCAINE HCL (PF) 1 % IJ SOLN
INTRAMUSCULAR | Status: AC
  Filled 2022-03-20: qty 30

## 2022-03-20 MED ORDER — OXYCODONE HCL 5 MG PO TABS
ORAL_TABLET | ORAL | Status: AC
  Filled 2022-03-20: qty 2

## 2022-03-20 MED ORDER — HEPARIN (PORCINE) IN NACL 1000-0.9 UT/500ML-% IV SOLN
INTRAVENOUS | Status: AC
  Filled 2022-03-20: qty 500

## 2022-03-20 MED ORDER — VERAPAMIL HCL 2.5 MG/ML IV SOLN
INTRAVENOUS | Status: DC | PRN
  Administered 2022-03-20: 10 mL via INTRA_ARTERIAL

## 2022-03-20 MED ORDER — HYDRALAZINE HCL 20 MG/ML IJ SOLN: 10.0000 mg | INTRAMUSCULAR | Status: AC | PRN

## 2022-03-20 MED ORDER — SODIUM CHLORIDE 0.9 % IV SOLN
INTRAVENOUS | Status: AC
  Administered 2022-03-20: 50 mL/h via INTRAVENOUS

## 2022-03-20 MED ORDER — LIDOCAINE HCL (PF) 1 % IJ SOLN
INTRAMUSCULAR | Status: DC | PRN
  Administered 2022-03-20: 2 mL

## 2022-03-20 MED ORDER — FENTANYL CITRATE (PF) 100 MCG/2ML IJ SOLN
INTRAMUSCULAR | Status: AC
  Filled 2022-03-20: qty 2

## 2022-03-20 MED ORDER — MIDAZOLAM HCL 2 MG/2ML IJ SOLN
INTRAMUSCULAR | Status: AC
  Filled 2022-03-20: qty 2

## 2022-03-20 MED ORDER — IOHEXOL 350 MG/ML SOLN
INTRAVENOUS | Status: DC | PRN
  Administered 2022-03-20: 40 mL

## 2022-03-20 MED ORDER — FENTANYL CITRATE (PF) 100 MCG/2ML IJ SOLN
INTRAMUSCULAR | Status: DC | PRN
  Administered 2022-03-20: 25 ug via INTRAVENOUS

## 2022-03-20 MED ORDER — NITROGLYCERIN 1 MG/10 ML FOR IR/CATH LAB
INTRA_ARTERIAL | Status: AC
  Filled 2022-03-20: qty 10

## 2022-03-20 MED ORDER — SODIUM CHLORIDE 0.9% FLUSH: 3.0000 mL | INTRAVENOUS | Status: DC | PRN

## 2022-03-20 MED ORDER — MIDAZOLAM HCL 2 MG/2ML IJ SOLN
INTRAMUSCULAR | Status: DC | PRN
  Administered 2022-03-20: 1 mg via INTRAVENOUS

## 2022-03-20 MED ORDER — HEPARIN SODIUM (PORCINE) 1000 UNIT/ML IJ SOLN
INTRAMUSCULAR | Status: AC
  Filled 2022-03-20: qty 10

## 2022-03-20 MED ORDER — HEPARIN SODIUM (PORCINE) 1000 UNIT/ML IJ SOLN
INTRAMUSCULAR | Status: DC | PRN
  Administered 2022-03-20: 9000 [IU] via INTRAVENOUS

## 2022-03-20 MED ORDER — HEPARIN SODIUM (PORCINE) 5000 UNIT/ML IJ SOLN
5000.0000 [IU] | Freq: Three times a day (TID) | INTRAMUSCULAR | Status: DC
  Administered 2022-03-21: 5000 [IU] via SUBCUTANEOUS
  Filled 2022-03-20: qty 1

## 2022-03-20 MED ORDER — SODIUM CHLORIDE 0.9% FLUSH
3.0000 mL | Freq: Two times a day (BID) | INTRAVENOUS | Status: DC
Start: 2022-03-20 — End: 2022-03-21

## 2022-03-20 MED ORDER — SODIUM CHLORIDE 0.9 % IV SOLN: 250.0000 mL | INTRAVENOUS | Status: DC | PRN

## 2022-03-20 MED ORDER — HEPARIN (PORCINE) IN NACL 1000-0.9 UT/500ML-% IV SOLN
INTRAVENOUS | Status: DC | PRN
  Administered 2022-03-20 (×3): 500 mL

## 2022-03-20 MED FILL — Verapamil HCl IV Soln 2.5 MG/ML: INTRAVENOUS | Qty: 2 | Status: AC

## 2022-03-20 MED FILL — Ondansetron HCl Inj 4 MG/2ML (2 MG/ML): INTRAMUSCULAR | Qty: 2 | Status: AC

## 2022-03-20 SURGICAL SUPPLY — 20 items
BALLN EMERGE MR 2.0X12 (BALLOONS) ×2
BALLOON EMERGE MR 2.0X12 (BALLOONS) IMPLANT
BAND CMPR LRG ZPHR (HEMOSTASIS) ×1
BAND ZEPHYR COMPRESS 30 LONG (HEMOSTASIS) ×1 IMPLANT
CATH LAUNCHER 6FR AL1 (CATHETERS) IMPLANT
CATHETER LAUNCHER 6FR AL1 (CATHETERS) ×2
DEVICE SPIDERFX EMB PROT 3MM (WIRE) ×1 IMPLANT
ELECT DEFIB PAD ADLT CADENCE (PAD) ×1 IMPLANT
GLIDESHEATH SLEND SS 6F .021 (SHEATH) ×1 IMPLANT
GUIDEWIRE INQWIRE 1.5J.035X260 (WIRE) IMPLANT
INQWIRE 1.5J .035X260CM (WIRE) ×2
KIT ENCORE 26 ADVANTAGE (KITS) ×1 IMPLANT
KIT ESSENTIALS PG (KITS) ×1 IMPLANT
KIT HEART LEFT (KITS) ×2 IMPLANT
PACK CARDIAC CATHETERIZATION (CUSTOM PROCEDURE TRAY) ×2 IMPLANT
STENT SYNERGY XD 3.0X12 (Permanent Stent) IMPLANT
SYNERGY XD 3.0X12 (Permanent Stent) ×2 IMPLANT
TRANSDUCER W/STOPCOCK (MISCELLANEOUS) ×2 IMPLANT
TUBING CIL FLEX 10 FLL-RA (TUBING) ×2 IMPLANT
WIRE COUGAR XT STRL 190CM (WIRE) ×1 IMPLANT

## 2022-03-20 NOTE — Progress Notes (Addendum)
The patient has been seen in conjunction with Harlan Stains, NP. All aspects of care have been considered and discussed. The patient has been personally interviewed, examined, and all clinical data has been reviewed.  Stable.  No groin hematoma. For staged PCI of SVG to OM1.  Relatively focal.  Dr. Angelena Form will perform.  Good potentially be done from right radial approach or left radial approach.  Right femoral access site is unremarkable. He advocates for not being discharged until tomorrow morning when he has a ride home. NPO.   Progress Note  Patient Name: Erik Alvarado Date of Encounter: 03/20/2022  [facility] Cardiologist: Evalina Field, MD   Subjective   No complaints this morning. Planned for cardiac cath today.   Inpatient Medications    Scheduled Meds:  aspirin  81 mg Oral Daily   Chlorhexidine Gluconate Cloth  6 each Topical Daily   clopidogrel  75 mg Oral Q breakfast   dapagliflozin propanediol  10 mg Oral Daily   ezetimibe  10 mg Oral Daily   heparin  5,000 Units Subcutaneous Q8H   sodium chloride flush  3 mL Intravenous Q12H   sodium chloride flush  3 mL Intravenous Q12H   Continuous Infusions:  sodium chloride     sodium chloride     sodium chloride 1 mL/kg/hr (03/20/22 0528)   PRN Meds: sodium chloride, sodium chloride, acetaminophen, acetaminophen, nitroGLYCERIN, ondansetron (ZOFRAN) IV, oxyCODONE, sodium chloride flush, sodium chloride flush   Vital Signs    Vitals:   03/19/22 2013 03/19/22 2358 03/20/22 0435 03/20/22 0808  BP: 125/61 (!) 94/53 (!) 95/45 114/64  Pulse: (!) 58 (!) 55 (!) 51 (!) 53  Resp: _0 Temp: 98.5 F (36.9 C) 98.5 F (36.9 C) 98.5 F (36.9 C) 97.6 F (36.4 C)  TempSrc: Oral Oral Oral Oral  SpO2: 97% 95% 94%   Weight:      Height:        Intake/Output Summary (Last 24 hours) at 03/20/2022 0917 Last data filed at 03/20/2022 0528 Gross per 24 hour  Intake --  Output 1100 ml  Net -1100 ml       03/17/2022    5:30 PM 11/02/2020   11:16 AM 11/24/2019   10:34 AM  Last 3 Weights  Weight (lbs) 166 lb 10.7 oz 153 lb 3.2 oz 198 lb  Weight (kg) 75.6 kg 69.491 kg 89.812 kg      Telemetry    Sinus bradycardia, PACs - Personally Reviewed  ECG    No new tracing  Physical Exam   GEN: No acute distress.   Neck: No JVD Cardiac: RRR, no murmurs, rubs, or gallops.  Respiratory: Clear to auscultation bilaterally. GI: Soft, nontender, non-distended  MS: No edema; No deformity. Neuro:  Nonfocal  Psych: Normal affect   Labs    High Sensitivity Troponin:   Recent Labs  Lab 03/17/22 1834 03/17/22 2101 03/18/22 1222  TROPONINIHS 6,281* 6,993* 15,198*     Chemistry Recent Labs  Lab 03/17/22 1834 03/18/22 0327 03/19/22 0739 03/20/22 0003  NA 142 139 138 140  K 3.9 3.6 3.2* 4.1  CL 105 104 103 101  CO2 _1 GLUCOSE 108* 93 162* 94  BUN _2 CREATININE 0.92 0.93 1.13 1.05  CALCIUM 9.2 8.8* 8.5* 8.9  PROT 6.2*  --   --   --   ALBUMIN 3.5  --   --   --   AST  94*  --   --   --   ALT 21  --   --   --   ALKPHOS 52  --   --   --   BILITOT 1.1  --   --   --   GFRNONAA >60 >60 >60 >60  ANIONGAP _0 Lipids  Recent Labs  Lab 03/18/22 0327  CHOL 216*  TRIG 178*  HDL 32*  LDLCALC 148*  CHOLHDL 6.8    Hematology Recent Labs  Lab 03/17/22 1834 03/18/22 0327 03/19/22 0739  WBC 11.7* 10.7* 8.1  RBC 4.47 4.20* 4.50  HGB 14.4 13.9 14.6  HCT 43.2 40.3 43.0  MCV 96.6 96.0 95.6  MCH 32.2 33.1 32.4  MCHC 33.3 34.5 34.0  RDW 13.1 13.3 13.0  PLT 236 206 191   Thyroid  Recent Labs  Lab 03/19/22 0739  TSH 4.983*    BNPNo results for input(s): "BNP", "PROBNP" in the last 168 hours.  DDimer No results for input(s): "DDIMER" in the last 168 hours.   Radiology    ECHOCARDIOGRAM COMPLETE  Result Date: 03/19/2022    ECHOCARDIOGRAM REPORT   Patient Name:   Erik Alvarado Date of Exam: 03/19/2022 Medical Rec #:  [id number]        Height:        72.0 in Accession #:    [id number]       Weight:       166.7 lb Date of Birth:  04/10/1951        BSA:          1.972 m Patient Age:    71 years         BP:           85/61 mmHg Patient Gender: M                HR:           78 bpm. Exam Location:  Inpatient Procedure: 2D Echo, Cardiac Doppler and Color Doppler Indications:    CAD  History:        Patient has prior history of Echocardiogram examinations, most                 recent 08/09/2013. CAD; Cardiac cath 03/17/22 and Prior CABG.  Sonographer:    Merrie Roof RDCS Referring Phys: [place]  1. Left ventricular ejection fraction, by estimation, is 60 to 65%. The left ventricle has normal function. The left ventricle has no regional wall motion abnormalities. Left ventricular diastolic parameters were normal.  2. Right ventricular systolic function is normal. The right ventricular size is normal. There is normal pulmonary artery systolic pressure. The estimated right ventricular systolic pressure is 40.3 mmHg.  3. The mitral valve is degenerative. Mild mitral valve regurgitation. No evidence of mitral stenosis.  4. The aortic valve is tricuspid. There is moderate calcification of the aortic valve. There is moderate thickening of the aortic valve. Aortic valve regurgitation is not visualized. Mild aortic valve stenosis. Aortic valve mean gradient measures 19.0 mmHg. Aortic valve Vmax measures 2.62 m/s.  5. The inferior vena cava is normal in size with greater than 50% respiratory variability, suggesting right atrial pressure of 3 mmHg. FINDINGS  Left Ventricle: Left ventricular ejection fraction, by estimation, is 60 to 65%. The left ventricle has normal function. The left ventricle has no regional wall motion abnormalities. The left ventricular internal cavity size was normal in size. There  is  no left ventricular hypertrophy. Abnormal (paradoxical) septal motion consistent with post-operative status. Left ventricular diastolic parameters were  normal. Right Ventricle: The right ventricular size is normal. No increase in right ventricular wall thickness. Right ventricular systolic function is normal. There is normal pulmonary artery systolic pressure. The tricuspid regurgitant velocity is 2.67 m/s, and  with an assumed right atrial pressure of 3 mmHg, the estimated right ventricular systolic pressure is 31.5 mmHg. Left Atrium: Left atrial size was normal in size. Right Atrium: Right atrial size was normal in size. Pericardium: There is no evidence of pericardial effusion. Mitral Valve: The mitral valve is degenerative in appearance. Mild mitral annular calcification. Mild mitral valve regurgitation. No evidence of mitral valve stenosis. Tricuspid Valve: The tricuspid valve is grossly normal. Tricuspid valve regurgitation is mild . No evidence of tricuspid stenosis. Aortic Valve: The aortic valve is tricuspid. There is moderate calcification of the aortic valve. There is moderate thickening of the aortic valve. Aortic valve regurgitation is not visualized. Mild aortic stenosis is present. Aortic valve mean gradient measures 19.0 mmHg. Aortic valve peak gradient measures 27.5 mmHg. Aortic valve area, by VTI measures 1.09 cm. Pulmonic Valve: The pulmonic valve was grossly normal. Pulmonic valve regurgitation is not visualized. No evidence of pulmonic stenosis. Aorta: The aortic root and ascending aorta are structurally normal, with no evidence of dilitation. Venous: The inferior vena cava is normal in size with greater than 50% respiratory variability, suggesting right atrial pressure of 3 mmHg. IAS/Shunts: The atrial septum is grossly normal.  LEFT VENTRICLE PLAX 2D LVIDd:         4.30 cm   Diastology LVIDs:         2.90 cm   LV e' medial:    7.29 cm/s LV PW:         1.00 cm   LV E/e' medial:  8.5 LV IVS:        0.90 cm   LV e' lateral:   11.10 cm/s LVOT diam:     2.00 cm   LV E/e' lateral: 5.6 LV SV:         61 LV SV Index:   31 LVOT Area:     3.14 cm   RIGHT VENTRICLE RV Basal diam:  3.20 cm RV S prime:     11.60 cm/s TAPSE (M-mode): 1.7 cm LEFT ATRIUM             Index        RIGHT ATRIUM           Index LA diam:        3.80 cm 1.93 cm/m   RA Area:     14.90 cm LA Vol (A2C):   55.3 ml 28.05 ml/m  RA Volume:   34.40 ml  17.45 ml/m LA Vol (A4C):   49.3 ml 25.00 ml/m LA Biplane Vol: 54.5 ml 27.64 ml/m  AORTIC VALVE AV Area (Vmax):    1.18 cm AV Area (Vmean):   0.99 cm AV Area (VTI):     1.09 cm AV Vmax:           262.00 cm/s AV Vmean:          190.500 cm/s AV VTI:            0.561 m AV Peak Grad:      27.5 mmHg AV Mean Grad:      19.0 mmHg LVOT Vmax:         98.50 cm/s LVOT Vmean:  59.750 cm/s LVOT VTI:          0.194 m LVOT/AV VTI ratio: 0.35  AORTA Ao Root diam: 3.10 cm Ao Asc diam:  2.60 cm MITRAL VALVE               TRICUSPID VALVE MV Area (PHT): 2.22 cm    TR Peak grad:   28.5 mmHg MV Decel Time: 342 msec    TR Vmax:        267.00 cm/s MV E velocity: 61.80 cm/s MV A velocity: 59.70 cm/s  SHUNTS MV E/A ratio:  1.04        Systemic VTI:  0.19 m                            Systemic Diam: 2.00 cm Eleonore Chiquito MD Electronically signed by Eleonore Chiquito MD Signature Date/Time: 03/19/2022/12:28:12 PM    Final     Cardiac Studies   Cath: 03/17/22  CONCLUSIONS: Occlusion of the proximal circumflex treated with angioplasty followed by stenting from the ostium to the mid second obtuse marginal covering in-stent restenosis in the mid second marginal.  Stent was 2.25 x 26 Onyx deployed at 16 atm x 2. 65 to 75% distal left main unchanged by description from 2017 angiography at [facility] patent stent in the proximal to mid LAD and also in the first diagonal (which is jailed by the LAD stent). Patent RCA but with proximal to distal overlapping stents with multiple layers.  Also the site of prior brachytherapy dating back 20 years.  Mid to distal eccentric 70% in-stent restenosis is noted. Occluded saphenous vein graft to  PDA Occluded limb of saphenous vein graft to the second obtuse marginal.  The main body of the saphenous vein graft to the first obtuse marginal contains a focal 90% stenosis.   Atretic LIMA. Low normal to mildly depressed LVEF, 45 to 50% with LVEDP 24 mmHg.  Consistent with acute diastolic heart failure   RECOMMENDATIONS:   Aspirin and Plavix Cycle markers 2D Doppler echocardiogram Anticipate stent of the focal 90% stenosis in the shaft of the SVG to OM1. Gentle hydration.  Watch for heart failure as LVEDP is elevated.  Consider starting SGLT2 therapy  Diagnostic Dominance: Right  Intervention    Patient Profile     71 y.o. male with CAD status post CABG, hyperlipidemia, tobacco abuse who was admitted on 03/17/2022 for non-STEMI.  Status post intervention to the native left circumflex.   Assessment & Plan    Non-STEMI: High-sensitivity troponin peaked at 15,198.  Underwent cardiac catheterization noted above with severe multivessel CAD, LIMA is atretic, occluded vein graft to RCA.  S/p intervention to the native LCX into OM branch. Does have disease in the SVG to OM 2 with plans for staged intervention today. No recurrent chest pain.  --Continue IV heparin, aspirin, Plavix, Zetia  HFrEF Ischemic cardiomyopathy --LV gram showed LVEF of 40 to 45%, elevated EDP at the time of cath.  Follow-up echocardiogram showed LVEF of 60 to 65%, no regional wall motion abnormality, normal RV, mild MR -- He was given IV Lasix and net negative 2.5 L post cath. -- Unable to add beta-blocker secondary to bradycardia, blood pressures also remained soft. --Continue Farxiga  Hyperlipidemia: LDL 148, HDL 32 --Statin intolerant as well as PCSK9 inhibitor intolerance --Continue Zetia, consider outpatient Leqvio  Hypotension: blood pressures have been borderline soft, no room for GDMT at this time  For questions or updates, please contact [place] Please consult [email or web address] for contact info  under        Signed, Reino Bellis, NP  03/20/2022, 9:17 AM

## 2022-03-20 NOTE — Progress Notes (Signed)
  Transition of Care [facility]) Screening Note   Patient Details  Name: Erik Alvarado Date of Birth: 07-15-1951   Transition of Care [facility]) CM/SW Contact:    Milas Gain, [place] Phone Number: 03/20/2022, 5:10 PM    Transition of Care Department [facility]) has reviewed patient and no TOC needs have been identified at this time. We will continue to monitor patient advancement through interdisciplinary progression rounds. If new patient transition needs arise, please place a TOC consult.

## 2022-03-20 NOTE — Progress Notes (Signed)
CARDIAC REHAB PHASE I   Began MI/stent education with pt. Pt educated on importance of ASA and Plavix. Pt given MI book and heart healthy diet. Reviewed site care and restrictions. Will refer to CRP II [place] to meet the requirements, with knowledge pt not interested in attending. Pt for staged intervention today. Will continue to follow.  [id number] Rufina Falco, RN BSN 03/20/2022 9:05 AM

## 2022-03-20 NOTE — H&P (View-Only) (Signed)
 The patient has been seen in conjunction with Lindsey Roberts, NP. All aspects of care have been considered and discussed. The patient has been personally interviewed, examined, and all clinical data has been reviewed.  Stable.  No groin hematoma. For staged PCI of SVG to OM1.  Relatively focal.  Dr. McAlhany will perform.  Good potentially be done from right radial approach or left radial approach.  Right femoral access site is unremarkable. He advocates for not being discharged until tomorrow morning when he has a ride home. NPO.   Progress Note  Patient Name: Erik Alvarado Date of Encounter: 03/20/2022  [facility] Cardiologist: [place] T O'Neal, MD   Subjective   No complaints this morning. Planned for cardiac cath today.   Inpatient Medications    Scheduled Meds:  aspirin  81 mg Oral Daily   Chlorhexidine Gluconate Cloth  6 each Topical Daily   clopidogrel  75 mg Oral Q breakfast   dapagliflozin propanediol  10 mg Oral Daily   ezetimibe  10 mg Oral Daily   heparin  5,000 Units Subcutaneous Q8H   sodium chloride flush  3 mL Intravenous Q12H   sodium chloride flush  3 mL Intravenous Q12H   Continuous Infusions:  sodium chloride     sodium chloride     sodium chloride 1 mL/kg/hr (03/20/22 0528)   PRN Meds: sodium chloride, sodium chloride, acetaminophen, acetaminophen, nitroGLYCERIN, ondansetron (ZOFRAN) IV, oxyCODONE, sodium chloride flush, sodium chloride flush   Vital Signs    Vitals:   03/19/22 2013 03/19/22 2358 03/20/22 0435 03/20/22 0808  BP: 125/61 (!) 94/53 (!) 95/45 114/64  Pulse: (!) 58 (!) 55 (!) 51 (!) 53  Resp: 20 18 14 12  Temp: 98.5 F (36.9 C) 98.5 F (36.9 C) 98.5 F (36.9 C) 97.6 F (36.4 C)  TempSrc: Oral Oral Oral Oral  SpO2: 97% 95% 94%   Weight:      Height:        Intake/Output Summary (Last 24 hours) at 03/20/2022 0917 Last data filed at 03/20/2022 0528 Gross per 24 hour  Intake --  Output 1100 ml  Net -1100 ml       03/17/2022    5:30 PM 11/02/2020   11:16 AM 11/24/2019   10:34 AM  Last 3 Weights  Weight (lbs) 166 lb 10.7 oz 153 lb 3.2 oz 198 lb  Weight (kg) 75.6 kg 69.491 kg 89.812 kg      Telemetry    Sinus bradycardia, PACs - Personally Reviewed  ECG    No new tracing  Physical Exam   GEN: No acute distress.   Neck: No JVD Cardiac: RRR, no murmurs, rubs, or gallops.  Respiratory: Clear to auscultation bilaterally. GI: Soft, nontender, non-distended  MS: No edema; No deformity. Neuro:  Nonfocal  Psych: Normal affect   Labs    High Sensitivity Troponin:   Recent Labs  Lab 03/17/22 1834 03/17/22 2101 03/18/22 1222  TROPONINIHS 6,281* 6,993* 15,198*     Chemistry Recent Labs  Lab 03/17/22 1834 03/18/22 0327 03/19/22 0739 03/20/22 0003  NA 142 139 138 140  K 3.9 3.6 3.2* 4.1  CL 105 104 103 101  CO2 27 22 25 26  GLUCOSE 108* 93 162* 94  BUN 10 13 14 16  CREATININE 0.92 0.93 1.13 1.05  CALCIUM 9.2 8.8* 8.5* 8.9  PROT 6.2*  --   --   --   ALBUMIN 3.5  --   --   --   AST   94*  --   --   --   ALT 21  --   --   --   ALKPHOS 52  --   --   --   BILITOT 1.1  --   --   --   GFRNONAA >60 >60 >60 >60  ANIONGAP 10 13 10 13    Lipids  Recent Labs  Lab 03/18/22 0327  CHOL 216*  TRIG 178*  HDL 32*  LDLCALC 148*  CHOLHDL 6.8    Hematology Recent Labs  Lab 03/17/22 1834 03/18/22 0327 03/19/22 0739  WBC 11.7* 10.7* 8.1  RBC 4.47 4.20* 4.50  HGB 14.4 13.9 14.6  HCT 43.2 40.3 43.0  MCV 96.6 96.0 95.6  MCH 32.2 33.1 32.4  MCHC 33.3 34.5 34.0  RDW 13.1 13.3 13.0  PLT 236 206 191   Thyroid  Recent Labs  Lab 03/19/22 0739  TSH 4.983*    BNPNo results for input(s): "BNP", "PROBNP" in the last 168 hours.  DDimer No results for input(s): "DDIMER" in the last 168 hours.   Radiology    ECHOCARDIOGRAM COMPLETE  Result Date: 03/19/2022    ECHOCARDIOGRAM REPORT   Patient Name:   Erik Alvarado Date of Exam: 03/19/2022 Medical Rec #:  [id number]        Height:        72.0 in Accession #:    [id number]       Weight:       166.7 lb Date of Birth:  05/28/1951        BSA:          1.972 m Patient Age:    70 years         BP:           85/61 mmHg Patient Gender: M                HR:           78 bpm. Exam Location:  Inpatient Procedure: 2D Echo, Cardiac Doppler and Color Doppler Indications:    CAD  History:        Patient has prior history of Echocardiogram examinations, most                 recent 08/09/2013. CAD; Cardiac cath 03/17/22 and Prior CABG.  Sonographer:    Rachel Lane RDCS Referring Phys: [id number] Chantalle Defilippo W Lynell Kussman IMPRESSIONS  1. Left ventricular ejection fraction, by estimation, is 60 to 65%. The left ventricle has normal function. The left ventricle has no regional wall motion abnormalities. Left ventricular diastolic parameters were normal.  2. Right ventricular systolic function is normal. The right ventricular size is normal. There is normal pulmonary artery systolic pressure. The estimated right ventricular systolic pressure is 31.5 mmHg.  3. The mitral valve is degenerative. Mild mitral valve regurgitation. No evidence of mitral stenosis.  4. The aortic valve is tricuspid. There is moderate calcification of the aortic valve. There is moderate thickening of the aortic valve. Aortic valve regurgitation is not visualized. Mild aortic valve stenosis. Aortic valve mean gradient measures 19.0 mmHg. Aortic valve Vmax measures 2.62 m/s.  5. The inferior vena cava is normal in size with greater than 50% respiratory variability, suggesting right atrial pressure of 3 mmHg. FINDINGS  Left Ventricle: Left ventricular ejection fraction, by estimation, is 60 to 65%. The left ventricle has normal function. The left ventricle has no regional wall motion abnormalities. The left ventricular internal cavity size was normal in size. There   is  no left ventricular hypertrophy. Abnormal (paradoxical) septal motion consistent with post-operative status. Left ventricular diastolic parameters were  normal. Right Ventricle: The right ventricular size is normal. No increase in right ventricular wall thickness. Right ventricular systolic function is normal. There is normal pulmonary artery systolic pressure. The tricuspid regurgitant velocity is 2.67 m/s, and  with an assumed right atrial pressure of 3 mmHg, the estimated right ventricular systolic pressure is 31.5 mmHg. Left Atrium: Left atrial size was normal in size. Right Atrium: Right atrial size was normal in size. Pericardium: There is no evidence of pericardial effusion. Mitral Valve: The mitral valve is degenerative in appearance. Mild mitral annular calcification. Mild mitral valve regurgitation. No evidence of mitral valve stenosis. Tricuspid Valve: The tricuspid valve is grossly normal. Tricuspid valve regurgitation is mild . No evidence of tricuspid stenosis. Aortic Valve: The aortic valve is tricuspid. There is moderate calcification of the aortic valve. There is moderate thickening of the aortic valve. Aortic valve regurgitation is not visualized. Mild aortic stenosis is present. Aortic valve mean gradient measures 19.0 mmHg. Aortic valve peak gradient measures 27.5 mmHg. Aortic valve area, by VTI measures 1.09 cm. Pulmonic Valve: The pulmonic valve was grossly normal. Pulmonic valve regurgitation is not visualized. No evidence of pulmonic stenosis. Aorta: The aortic root and ascending aorta are structurally normal, with no evidence of dilitation. Venous: The inferior vena cava is normal in size with greater than 50% respiratory variability, suggesting right atrial pressure of 3 mmHg. IAS/Shunts: The atrial septum is grossly normal.  LEFT VENTRICLE PLAX 2D LVIDd:         4.30 cm   Diastology LVIDs:         2.90 cm   LV e' medial:    7.29 cm/s LV PW:         1.00 cm   LV E/e' medial:  8.5 LV IVS:        0.90 cm   LV e' lateral:   11.10 cm/s LVOT diam:     2.00 cm   LV E/e' lateral: 5.6 LV SV:         61 LV SV Index:   31 LVOT Area:     3.14 cm   RIGHT VENTRICLE RV Basal diam:  3.20 cm RV S prime:     11.60 cm/s TAPSE (M-mode): 1.7 cm LEFT ATRIUM             Index        RIGHT ATRIUM           Index LA diam:        3.80 cm 1.93 cm/m   RA Area:     14.90 cm LA Vol (A2C):   55.3 ml 28.05 ml/m  RA Volume:   34.40 ml  17.45 ml/m LA Vol (A4C):   49.3 ml 25.00 ml/m LA Biplane Vol: 54.5 ml 27.64 ml/m  AORTIC VALVE AV Area (Vmax):    1.18 cm AV Area (Vmean):   0.99 cm AV Area (VTI):     1.09 cm AV Vmax:           262.00 cm/s AV Vmean:          190.500 cm/s AV VTI:            0.561 m AV Peak Grad:      27.5 mmHg AV Mean Grad:      19.0 mmHg LVOT Vmax:         98.50 cm/s LVOT Vmean:  59.750 cm/s LVOT VTI:          0.194 m LVOT/AV VTI ratio: 0.35  AORTA Ao Root diam: 3.10 cm Ao Asc diam:  2.60 cm MITRAL VALVE               TRICUSPID VALVE MV Area (PHT): 2.22 cm    TR Peak grad:   28.5 mmHg MV Decel Time: 342 msec    TR Vmax:        267.00 cm/s MV E velocity: 61.80 cm/s MV A velocity: 59.70 cm/s  SHUNTS MV E/A ratio:  1.04        Systemic VTI:  0.19 m                            Systemic Diam: 2.00 cm Eleonore Chiquito MD Electronically signed by Eleonore Chiquito MD Signature Date/Time: 03/19/2022/12:28:12 PM    Final     Cardiac Studies   Cath: 03/17/22  CONCLUSIONS: Occlusion of the proximal circumflex treated with angioplasty followed by stenting from the ostium to the mid second obtuse marginal covering in-stent restenosis in the mid second marginal.  Stent was 2.25 x 26 Onyx deployed at 16 atm x 2. 65 to 75% distal left main unchanged by description from 2017 angiography at [facility] patent stent in the proximal to mid LAD and also in the first diagonal (which is jailed by the LAD stent). Patent RCA but with proximal to distal overlapping stents with multiple layers.  Also the site of prior brachytherapy dating back 20 years.  Mid to distal eccentric 70% in-stent restenosis is noted. Occluded saphenous vein graft to  PDA Occluded limb of saphenous vein graft to the second obtuse marginal.  The main body of the saphenous vein graft to the first obtuse marginal contains a focal 90% stenosis.   Atretic LIMA. Low normal to mildly depressed LVEF, 45 to 50% with LVEDP 24 mmHg.  Consistent with acute diastolic heart failure   RECOMMENDATIONS:   Aspirin and Plavix Cycle markers 2D Doppler echocardiogram Anticipate stent of the focal 90% stenosis in the shaft of the SVG to OM1. Gentle hydration.  Watch for heart failure as LVEDP is elevated.  Consider starting SGLT2 therapy  Diagnostic Dominance: Right  Intervention    Patient Profile     71 y.o. male with CAD status post CABG, hyperlipidemia, tobacco abuse who was admitted on 03/17/2022 for non-STEMI.  Status post intervention to the native left circumflex.   Assessment & Plan    Non-STEMI: High-sensitivity troponin peaked at 15,198.  Underwent cardiac catheterization noted above with severe multivessel CAD, LIMA is atretic, occluded vein graft to RCA.  S/p intervention to the native LCX into OM branch. Does have disease in the SVG to OM 2 with plans for staged intervention today. No recurrent chest pain.  --Continue IV heparin, aspirin, Plavix, Zetia  HFrEF Ischemic cardiomyopathy --LV gram showed LVEF of 40 to 45%, elevated EDP at the time of cath.  Follow-up echocardiogram showed LVEF of 60 to 65%, no regional wall motion abnormality, normal RV, mild MR -- He was given IV Lasix and net negative 2.5 L post cath. -- Unable to add beta-blocker secondary to bradycardia, blood pressures also remained soft. --Continue Farxiga  Hyperlipidemia: LDL 148, HDL 32 --Statin intolerant as well as PCSK9 inhibitor intolerance --Continue Zetia, consider outpatient Leqvio  Hypotension: blood pressures have been borderline soft, no room for GDMT at this time  For questions or updates, please contact [facility] Please consult [email or web address] for contact info  under        Signed, Lindsay Roberts, NP  03/20/2022, 9:17 AM    

## 2022-03-20 NOTE — Interval H&P Note (Signed)
History and Physical Interval Note:  03/20/2022 10:53 AM  Erik Alvarado  has presented today for surgery, with the diagnosis of CAD.  The various methods of treatment have been discussed with the patient and family. After consideration of risks, benefits and other options for treatment, the patient has consented to  Procedure(s): CORONARY STENT INTERVENTION (N/A) as a surgical intervention.  The patient's history has been reviewed, patient examined, no change in status, stable for surgery.  I have reviewed the patient's chart and labs.  Questions were answered to the patient's satisfaction.    Cath Lab Visit (complete for each Cath Lab visit)  Clinical Evaluation Leading to the Procedure:   ACS: Yes.    Non-ACS:    Anginal Classification: CCS IV  Anti-ischemic medical therapy: No Therapy  Non-Invasive Test Results: No non-invasive testing performed  Prior CABG: Previous CABG        Lauree Chandler

## 2022-03-20 NOTE — Progress Notes (Signed)
TR BAND REMOVAL  LOCATION:    right radial  DEFLATED PER PROTOCOL:    Yes.    TIME BAND OFF / DRESSING APPLIED:    1600 a clean dry dressing was applied with gauze and tegaderm cover with coban   SITE UPON ARRIVAL:    Level 0  SITE AFTER BAND REMOVAL:    Level 0 small bruise above site.  CIRCULATION SENSATION AND MOVEMENT:    Within Normal Limits   Yes.    COMMENTS:   Care instruction given to patient.

## 2022-03-21 ENCOUNTER — Other Ambulatory Visit (HOSPITAL_COMMUNITY): Payer: Self-pay

## 2022-03-21 ENCOUNTER — Telehealth (HOSPITAL_COMMUNITY): Payer: Self-pay | Admitting: Pharmacist

## 2022-03-21 ENCOUNTER — Telehealth (HOSPITAL_COMMUNITY): Payer: Self-pay | Admitting: Pharmacy Technician

## 2022-03-21 DIAGNOSIS — E782 Mixed hyperlipidemia: Secondary | ICD-10-CM

## 2022-03-21 DIAGNOSIS — I214 Non-ST elevation (NSTEMI) myocardial infarction: Secondary | ICD-10-CM | POA: Diagnosis not present

## 2022-03-21 DIAGNOSIS — I10 Essential (primary) hypertension: Secondary | ICD-10-CM

## 2022-03-21 DIAGNOSIS — Z720 Tobacco use: Secondary | ICD-10-CM

## 2022-03-21 DIAGNOSIS — Z955 Presence of coronary angioplasty implant and graft: Secondary | ICD-10-CM

## 2022-03-21 LAB — CBC
HCT: 40.2 % (ref 39.0–52.0)
Hemoglobin: 13.9 g/dL (ref 13.0–17.0)
MCH: 33.1 pg (ref 26.0–34.0)
MCHC: 34.6 g/dL (ref 30.0–36.0)
MCV: 95.7 fL (ref 80.0–100.0)
Platelets: 209 10*3/uL (ref 150–400)
RBC: 4.2 MIL/uL — ABNORMAL LOW (ref 4.22–5.81)
RDW: 12.7 % (ref 11.5–15.5)
WBC: 7.5 10*3/uL (ref 4.0–10.5)
nRBC: 0 % (ref 0.0–0.2)

## 2022-03-21 LAB — BASIC METABOLIC PANEL
Anion gap: 5 (ref 5–15)
BUN: 16 mg/dL (ref 8–23)
CO2: 22 mmol/L (ref 22–32)
Calcium: 8.5 mg/dL — ABNORMAL LOW (ref 8.9–10.3)
Chloride: 110 mmol/L (ref 98–111)
Creatinine, Ser: 0.97 mg/dL (ref 0.61–1.24)
GFR, Estimated: >60 mL/min (ref >60)
Glucose, Bld: 84 mg/dL (ref 70–99)
Potassium: 4 mmol/L (ref 3.5–5.1)
Sodium: 137 mmol/L (ref 135–145)

## 2022-03-21 MED ORDER — DAPAGLIFLOZIN PROPANEDIOL 10 MG PO TABS
10.0000 mg | ORAL_TABLET | Freq: Every day | ORAL | 11 refills | Status: DC
  Filled 2022-03-21: qty 30, 30d supply, fill #0

## 2022-03-21 MED ORDER — CLOPIDOGREL BISULFATE 75 MG PO TABS
75.0000 mg | ORAL_TABLET | Freq: Every day | ORAL | 3 refills | Status: DC
  Filled 2022-03-21: qty 90, 90d supply, fill #0

## 2022-03-21 MED ORDER — NITROGLYCERIN 0.4 MG SL SUBL
0.4000 mg | SUBLINGUAL_TABLET | SUBLINGUAL | 0 refills | Status: AC | PRN
  Filled 2022-03-21: qty 25, 30d supply, fill #0

## 2022-03-21 MED ORDER — HYDROCHLOROTHIAZIDE 25 MG PO TABS: 25.0000 mg | ORAL_TABLET | Freq: Every day | ORAL | Status: DC | PRN

## 2022-03-21 MED FILL — Nitroglycerin IV Soln 100 MCG/ML in D5W: INTRA_ARTERIAL | Qty: 10 | Status: AC

## 2022-03-21 NOTE — Telephone Encounter (Signed)
Pharmacy Patient Advocate Encounter  Insurance verification completed.    The patient is insured through [employer] Part D   The patient is currently admitted and ran test claims for the following: Clopidogrel, Delene Loll, Farxiga, Jardiance .  Copays and coinsurance results were relayed to Inpatient clinical team.

## 2022-03-21 NOTE — Progress Notes (Signed)
CARDIAC REHAB PHASE I   PRE:  Rate/Rhythm: 73  MODE:  Ambulation: 470 ft   POST:  Rate/Rhythm: 112   BP:  Sitting: 131/84    SaO2: 95 RA   Pt ambulated 437f in hallway independently with steady gait. Pt denies CP, SOB, or dizziness throughout. Reinforced importance of ASA and Plavix. Reviewed site care, restrictions, and exercise guidelines. Referred to [facility] [place] to meet the requirement. Pt anxious for d/c.  [id number] Falco RN BSN 03/21/2022 8:54 AM

## 2022-03-21 NOTE — TOC Benefit Eligibility Note (Signed)
Patient Teacher, English as a foreign language completed.    The patient is currently admitted and upon discharge could be taking clopidogrel (Plavix) 75 mg.  The current 30 day co-pay is, $1.45.   The patient is currently admitted and upon discharge could be taking Entresto 24-26 mg.  The current 30 day co-pay is, $4.30   The patient is currently admitted and upon discharge could be taking Farxiga 10 mg.  The current 30 day co-pay is, $4.30   The patient is currently admitted and upon discharge could be taking Jardiance 10 mg.  The current 30 day co-pay is, $4.30   The patient is insured through [place], [place] Patient Advocate Specialist [place] Patient Advocate Team Direct Number: [phone]  Fax: [phone]

## 2022-03-21 NOTE — Discharge Summary (Signed)
Discharge Summary    Patient ID: Erik Alvarado MRN: [id number]; DOB: Jan 13, 1951  Admit date: 03/17/2022 Discharge date: 03/21/2022  PCP:  Cyndi Bender, [place] Providers Cardiologist:  Evalina Field, MD      Discharge Diagnoses    Principal Problem:   NSTEMI (non-ST elevated myocardial infarction) [facility]) Active Problems:   Hyperlipidemia   Essential hypertension   Tobacco abuse   Status post coronary artery stent placement    Diagnostic Studies/Procedures    Cath: 03/17/22   CONCLUSIONS: Occlusion of the proximal circumflex treated with angioplasty followed by stenting from the ostium to the mid second obtuse marginal covering in-stent restenosis in the mid second marginal.  Stent was 2.25 x 26 Onyx deployed at 16 atm x 2. 65 to 75% distal left main unchanged by description from 2017 angiography at [facility] patent stent in the proximal to mid LAD and also in the first diagonal (which is jailed by the LAD stent). Patent RCA but with proximal to distal overlapping stents with multiple layers.  Also the site of prior brachytherapy dating back 20 years.  Mid to distal eccentric 70% in-stent restenosis is noted. Occluded saphenous vein graft to PDA Occluded limb of saphenous vein graft to the second obtuse marginal.  The main body of the saphenous vein graft to the first obtuse marginal contains a focal 90% stenosis.   Atretic LIMA. Low normal to mildly depressed LVEF, 45 to 50% with LVEDP 24 mmHg.  Consistent with acute diastolic heart failure   RECOMMENDATIONS:   Aspirin and Plavix Cycle markers 2D Doppler echocardiogram Anticipate stent of the focal 90% stenosis in the shaft of the SVG to OM1. Gentle hydration.  Watch for heart failure as LVEDP is elevated.  Consider starting SGLT2 therapy   Diagnostic Dominance: Right  Intervention     Echo: 03/19/22  IMPRESSIONS     1. Left ventricular ejection fraction, by  estimation, is 60 to 65%. The  left ventricle has normal function. The left ventricle has no regional  wall motion abnormalities. Left ventricular diastolic parameters were  normal.   2. Right ventricular systolic function is normal. The right ventricular  size is normal. There is normal pulmonary artery systolic pressure. The  estimated right ventricular systolic pressure is 30.8 mmHg.   3. The mitral valve is degenerative. Mild mitral valve regurgitation. No  evidence of mitral stenosis.   4. The aortic valve is tricuspid. There is moderate calcification of the  aortic valve. There is moderate thickening of the aortic valve. Aortic  valve regurgitation is not visualized. Mild aortic valve stenosis. Aortic  valve mean gradient measures 19.0  mmHg. Aortic valve Vmax measures 2.62 m/s.   5. The inferior vena cava is normal in size with greater than 50%  respiratory variability, suggesting right atrial pressure of 3 mmHg.   FINDINGS   Left Ventricle: Left ventricular ejection fraction, by estimation, is 60  to 65%. The left ventricle has normal function. The left ventricle has no  regional wall motion abnormalities. The left ventricular internal cavity  size was normal in size. There is   no left ventricular hypertrophy. Abnormal (paradoxical) septal motion  consistent with post-operative status. Left ventricular diastolic  parameters were normal.   Right Ventricle: The right ventricular size is normal. No increase in  right ventricular wall thickness. Right ventricular systolic function is  normal. There is normal pulmonary artery systolic pressure. The tricuspid  regurgitant velocity is 2.67 m/s,  and   with an assumed right atrial pressure of 3 mmHg, the estimated right  ventricular systolic pressure is 50.2 mmHg.   Left Atrium: Left atrial size was normal in size.   Right Atrium: Right atrial size was normal in size.   Pericardium: There is no evidence of pericardial effusion.    Mitral Valve: The mitral valve is degenerative in appearance. Mild mitral  annular calcification. Mild mitral valve regurgitation. No evidence of  mitral valve stenosis.   Tricuspid Valve: The tricuspid valve is grossly normal. Tricuspid valve  regurgitation is mild . No evidence of tricuspid stenosis.   Aortic Valve: The aortic valve is tricuspid. There is moderate  calcification of the aortic valve. There is moderate thickening of the  aortic valve. Aortic valve regurgitation is not visualized. Mild aortic  stenosis is present. Aortic valve mean gradient  measures 19.0 mmHg. Aortic valve peak gradient measures 27.5 mmHg. Aortic  valve area, by VTI measures 1.09 cm.   Pulmonic Valve: The pulmonic valve was grossly normal. Pulmonic valve  regurgitation is not visualized. No evidence of pulmonic stenosis.   Aorta: The aortic root and ascending aorta are structurally normal, with  no evidence of dilitation.   Venous: The inferior vena cava is normal in size with greater than 50%  respiratory variability, suggesting right atrial pressure of 3 mmHg.   IAS/Shunts: The atrial septum is grossly normal.   Cath: 03/20/22    Prox Graft to Mid Graft lesion before 1st Mrg  is 85% stenosed.   1st Mrg lesion is 95% stenosed.   Severe stenosis in the proximal body of the vein graft to the first obtuse marginal branch.  Successful PTCA/DES x 1 to the vein graft to OM1 using distal embolic protection.    Recommendation: Continue DAPT with ASA and Plavix for at least one year.   Diagnostic Dominance: Right  Intervention   _____________   History of Present Illness     Erik Alvarado is a 71 y.o. male with above medical history who is followed by Dr. Audie Box.  Per chart review, patient has a long history of coronary artery disease.  Reportedly had 3 stents placed between 1996 and 2010.  Later had a CABG x4 in 2014 with LIMA-LAD, SVG-OM 1/2 (Y graft), SVG-PDA.  Went on to have POBA to  prox/mid RCA and DES to [facility] in 2017 done at [facility]. Also showed an occluded SVG-PDA. Patient also was previously diagnosed with spasmodic atherosclerotic disease.    Most recent echocardiogram available for review was completed on 9/7/218 at [place] LVEF 55%, small areas of mild septal and inferior wall hypokinesis, abnormal septal motion consistent with post-open heart surgery and possible elevated RV EDP.   Patient presented to [facility] ED on 7/6 complaining of chest pain. Patient described the pain as an intense burning/pressure that radiated to his neck and left arm. Felt exactly the same as symptoms he has had with prior heart attacks. Not associated with position, exertion. Not worse with cough, deep inhalation. Associated with diaphoresis. Denies n/v. First noticed the pain while at rest and rest has not relieved the pain. Patient was started on IV heparin at [facility]. Labs at [facility] showed Hemoglobin 13.8, creatinine 0.8. Trop 0.8>>1.58 . BP was labile at [facility] so he was not given IV nitro.  Patient is a heavy tobacco user.    Was transported to [facility] for further evaluation. Since arriving at [facility], patient has continued to  have intense chest pain. Gave one dose of SL nitro without improvement. Started IV nitro.     Hospital Course     Non-STEMI: High-sensitivity troponin peaked at 15,198.  Underwent cardiac catheterization noted above with severe multivessel CAD, LIMA is atretic, occluded vein graft to RCA.  S/p intervention to the native LCX into OM branch.  Underwent staged intervention to severe stenosis of the proximal body of vein graft to first OM with successful PCI/DES x1 using embolic protection. Seen by cardiac rehab and ambulated without recurrent chest pain. -- Continue DAPT with aspirin/Plavix for at least 1 year, Zetia   HFrEF Ischemic cardiomyopathy --LV gram showed LVEF of 40 to 45%, elevated EDP at the time of cath.  Follow-up  echocardiogram showed LVEF of 60 to 65%, no regional wall motion abnormality, normal RV, mild MR -- He was given IV Lasix and net negative 2.5 L post cath. -- Unable to add beta-blocker secondary to bradycardia, blood pressures also remained soft. GDMT is very limited.  --Continue Farxiga   Hyperlipidemia: LDL 148, HDL 32 --Statin intolerant as well as PCSK9 inhibitor intolerance --Continue Zetia, consider outpatient Leqvio   General: Well developed, well nourished, male appearing in no acute distress. Head: Normocephalic, atraumatic.  Neck: Supple without bruits, JVD. Lungs:  Resp regular and unlabored, CTA. Heart: RRR, S1, S2, no S3, S4, or murmur; no rub. Abdomen: Soft, non-tender, non-distended with normoactive bowel sounds. No hepatomegaly. No rebound/guarding. No obvious abdominal masses. Extremities: No clubbing, cyanosis, edema. Distal pedal pulses are 2+ bilaterally. Right radial cath site stable without bruising or hematoma Neuro: Alert and oriented X 3. Moves all extremities spontaneously. Psych: Normal affect.  Patient seen by Dr. Tamala Julian and deemed stable for discharge home. Follow up in the office has been arranged. Medications sent to the [facility] pharmacy.   Did the patient have an acute coronary syndrome (MI, NSTEMI, STEMI, etc) this admission?:  Yes                               AHA/ACC Clinical Performance & Quality Measures: Aspirin prescribed? - Yes ADP Receptor Inhibitor (Plavix/Clopidogrel, Brilinta/Ticagrelor or Effient/Prasugrel) prescribed (includes medically managed patients)? - Yes Beta Blocker prescribed? - No - baseline bradycardia High Intensity Statin (Lipitor 40-80mg  or Crestor 20-40mg ) prescribed? - No - intolerant  EF assessed during THIS hospitalization? - Yes For EF <40%, was ACEI/ARB prescribed? - Yes For EF <40%, Aldosterone Antagonist (Spironolactone or Eplerenone) prescribed? - Not Applicable (EF >/= 25%) Cardiac Rehab Phase II ordered (including  medically managed patients)? - Yes       The patient will be scheduled for a TOC follow up appointment in 10-14 days.  A message has been sent to the [facility] and Scheduling Pool at the office where the patient should be seen for follow up.  _____________  Discharge Vitals Blood pressure 132/73, pulse 85, temperature 98 F (36.7 C), temperature source Oral, resp. rate 16, height 6' (1.829 m), weight 75.6 kg, SpO2 95 %.  Filed Weights   03/17/22 1730  Weight: 75.6 kg    Labs & Radiologic Studies    CBC Recent Labs    03/19/22 0739 03/21/22 0417  WBC 8.1 7.5  HGB 14.6 13.9  HCT 43.0 40.2  MCV 95.6 95.7  PLT 191 638   Basic Metabolic Panel Recent Labs    03/20/22 0003 03/21/22 0417  NA 140 137  K 4.1 4.0  CL 101 110  CO2 26 22  GLUCOSE 94 84  BUN 16 16  CREATININE 1.05 0.97  CALCIUM 8.9 8.5*   Liver Function Tests No results for input(s): "AST", "ALT", "ALKPHOS", "BILITOT", "PROT", "ALBUMIN" in the last 72 hours. No results for input(s): "LIPASE", "AMYLASE" in the last 72 hours. High Sensitivity Troponin:   Recent Labs  Lab 03/17/22 1834 03/17/22 2101 03/18/22 1222  TROPONINIHS 6,281* 9,407* 15,198*    BNP Invalid input(s): "POCBNP" D-Dimer No results for input(s): "DDIMER" in the last 72 hours. Hemoglobin A1C Recent Labs    03/19/22 0040  HGBA1C 5.6   Fasting Lipid Panel No results for input(s): "CHOL", "HDL", "LDLCALC", "TRIG", "CHOLHDL", "LDLDIRECT" in the last 72 hours. Thyroid Function Tests Recent Labs    03/19/22 0739  TSH 4.983*   _____________  CARDIAC CATHETERIZATION  Result Date: 03/20/2022   Prox Graft to Mid Graft lesion before 1st Mrg  is 85% stenosed.   1st Mrg lesion is 95% stenosed. Severe stenosis in the proximal body of the vein graft to the first obtuse marginal branch. Successful PTCA/DES x 1 to the vein graft to OM1 using distal embolic protection. Recommendation: Continue DAPT with ASA and Plavix for at least one year.    ECHOCARDIOGRAM COMPLETE  Result Date: 03/19/2022    ECHOCARDIOGRAM REPORT   Patient Name:   Erik Alvarado Date of Exam: 03/19/2022 Medical Rec #:  [id number]        Height:       72.0 in Accession #:    [id number]       Weight:       166.7 lb Date of Birth:  [birth year]        BSA:          1.972 m Patient Age:    33 years         BP:           85/61 mmHg Patient Gender: M                HR:           78 bpm. Exam Location:  Inpatient Procedure: 2D Echo, Cardiac Doppler and Color Doppler Indications:    CAD  History:        Patient has prior history of Echocardiogram examinations, most                 recent 08/09/2013. CAD; Cardiac cath 03/17/22 and Prior CABG.  Sonographer:    Merrie Roof RDCS Referring Phys: [place]  1. Left ventricular ejection fraction, by estimation, is 60 to 65%. The left ventricle has normal function. The left ventricle has no regional wall motion abnormalities. Left ventricular diastolic parameters were normal.  2. Right ventricular systolic function is normal. The right ventricular size is normal. There is normal pulmonary artery systolic pressure. The estimated right ventricular systolic pressure is 24.4 mmHg.  3. The mitral valve is degenerative. Mild mitral valve regurgitation. No evidence of mitral stenosis.  4. The aortic valve is tricuspid. There is moderate calcification of the aortic valve. There is moderate thickening of the aortic valve. Aortic valve regurgitation is not visualized. Mild aortic valve stenosis. Aortic valve mean gradient measures 19.0 mmHg. Aortic valve Vmax measures 2.62 m/s.  5. The inferior vena cava is normal in size with greater than 50% respiratory variability, suggesting right atrial pressure of 3 mmHg. FINDINGS  Left Ventricle: Left ventricular ejection fraction, by estimation, is 60 to 65%. The left ventricle has  normal function. The left ventricle has no regional wall motion abnormalities. The left ventricular internal cavity  size was normal in size. There is  no left ventricular hypertrophy. Abnormal (paradoxical) septal motion consistent with post-operative status. Left ventricular diastolic parameters were normal. Right Ventricle: The right ventricular size is normal. No increase in right ventricular wall thickness. Right ventricular systolic function is normal. There is normal pulmonary artery systolic pressure. The tricuspid regurgitant velocity is 2.67 m/s, and  with an assumed right atrial pressure of 3 mmHg, the estimated right ventricular systolic pressure is 23.7 mmHg. Left Atrium: Left atrial size was normal in size. Right Atrium: Right atrial size was normal in size. Pericardium: There is no evidence of pericardial effusion. Mitral Valve: The mitral valve is degenerative in appearance. Mild mitral annular calcification. Mild mitral valve regurgitation. No evidence of mitral valve stenosis. Tricuspid Valve: The tricuspid valve is grossly normal. Tricuspid valve regurgitation is mild . No evidence of tricuspid stenosis. Aortic Valve: The aortic valve is tricuspid. There is moderate calcification of the aortic valve. There is moderate thickening of the aortic valve. Aortic valve regurgitation is not visualized. Mild aortic stenosis is present. Aortic valve mean gradient measures 19.0 mmHg. Aortic valve peak gradient measures 27.5 mmHg. Aortic valve area, by VTI measures 1.09 cm. Pulmonic Valve: The pulmonic valve was grossly normal. Pulmonic valve regurgitation is not visualized. No evidence of pulmonic stenosis. Aorta: The aortic root and ascending aorta are structurally normal, with no evidence of dilitation. Venous: The inferior vena cava is normal in size with greater than 50% respiratory variability, suggesting right atrial pressure of 3 mmHg. IAS/Shunts: The atrial septum is grossly normal.  LEFT VENTRICLE PLAX 2D LVIDd:         4.30 cm   Diastology LVIDs:         2.90 cm   LV e' medial:    7.29 cm/s LV PW:         1.00  cm   LV E/e' medial:  8.5 LV IVS:        0.90 cm   LV e' lateral:   11.10 cm/s LVOT diam:     2.00 cm   LV E/e' lateral: 5.6 LV SV:         61 LV SV Index:   31 LVOT Area:     3.14 cm  RIGHT VENTRICLE RV Basal diam:  3.20 cm RV S prime:     11.60 cm/s TAPSE (M-mode): 1.7 cm LEFT ATRIUM             Index        RIGHT ATRIUM           Index LA diam:        3.80 cm 1.93 cm/m   RA Area:     14.90 cm LA Vol (A2C):   55.3 ml 28.05 ml/m  RA Volume:   34.40 ml  17.45 ml/m LA Vol (A4C):   49.3 ml 25.00 ml/m LA Biplane Vol: 54.5 ml 27.64 ml/m  AORTIC VALVE AV Area (Vmax):    1.18 cm AV Area (Vmean):   0.99 cm AV Area (VTI):     1.09 cm AV Vmax:           262.00 cm/s AV Vmean:          190.500 cm/s AV VTI:            0.561 m AV Peak Grad:      27.5 mmHg AV Mean  Grad:      19.0 mmHg LVOT Vmax:         98.50 cm/s LVOT Vmean:        59.750 cm/s LVOT VTI:          0.194 m LVOT/AV VTI ratio: 0.35  AORTA Ao Root diam: 3.10 cm Ao Asc diam:  2.60 cm MITRAL VALVE               TRICUSPID VALVE MV Area (PHT): 2.22 cm    TR Peak grad:   28.5 mmHg MV Decel Time: 342 msec    TR Vmax:        267.00 cm/s MV E velocity: 61.80 cm/s MV A velocity: 59.70 cm/s  SHUNTS MV E/A ratio:  1.04        Systemic VTI:  0.19 m                            Systemic Diam: 2.00 cm Eleonore Chiquito MD Electronically signed by Eleonore Chiquito MD Signature Date/Time: 03/19/2022/12:28:12 PM    Final    CARDIAC CATHETERIZATION  Result Date: 03/17/2022 CONCLUSIONS: Occlusion of the proximal circumflex treated with angioplasty followed by stenting from the ostium to the mid second obtuse marginal covering in-stent restenosis in the mid second marginal.  Stent was 2.25 x 26 Onyx deployed at 16 atm x 2. 65 to 75% distal left main unchanged by description from 2017 angiography at [facility] patent stent in the proximal to mid LAD and also in the first diagonal (which is jailed by the LAD stent). Patent RCA but with proximal to distal  overlapping stents with multiple layers.  Also the site of prior brachytherapy dating back 20 years.  Mid to distal eccentric 70% in-stent restenosis is noted. Occluded saphenous vein graft to PDA Occluded limb of saphenous vein graft to the second obtuse marginal.  The main body of the saphenous vein graft to the first obtuse marginal contains a focal 90% stenosis.  Atretic LIMA. Low normal to mildly depressed LVEF, 45 to 50% with LVEDP 24 mmHg.  Consistent with acute diastolic heart failure RECOMMENDATIONS: Aspirin and Plavix Cycle markers 2D Doppler echocardiogram Anticipate stent of the focal 90% stenosis in the shaft of the SVG to OM1. Gentle hydration.  Watch for heart failure as LVEDP is elevated.  Consider starting SGLT2 therapy   Disposition   Pt is being discharged home today in good condition.  Follow-up Plans & Appointments     Follow-up Information     Lenna Sciara, NP Follow up on 03/28/2022.   Specialties: Nurse Practitioner, Family Medicine Why: at 2:20pm for your follow up appt with Dr. Racheal Patches' NP Maris Berger information: [address] [place] [address] [place] [address] [phone]                Discharge Instructions     AMB Referral to Advanced Lipid Disorders Clinic   Complete by: As directed    Internal Lipid Clinic Referral Scheduling  Internal lipid clinic referrals are providers within [facility], who wish to refer established patients for routine management (help in starting PCSK9 inhibitor therapy) or advanced therapies.  Internal MD referral criteria:              1. All patients with LDL>190 mg/dL  2. All patients with Triglycerides >500 mg/dL  3. Patients with suspected or confirmed heterozygous familial hyperlipidemia (HeFH) or homozygous familial hyperlipidemia (HoFH)  4.  Patients with family history of suspicious for genetic dyslipidemia desiring genetic testing  5. Patients refractory to standard guideline based therapy  6. Patients with  statin intolerance (failed 2 statins, one of which must be a high potency statin)  7. Patients who the provider desires to be seen by MD   Internal PharmD referral criteria:   1. Follow-up patients for medication management  2. Follow-up for compliance monitoring  3. Patients for drug education  4. Patients with statin intolerance  5. PCSK9 inhibitor education and prior authorization approvals  6. Patients with triglycerides <500 mg/dL  External Lipid Clinic Referral  External lipid clinic referrals are for providers outside of [facility], considered new clinic patients - automatically routed to MD schedule   Amb Referral to Cardiac Rehabilitation   Complete by: As directed    Diagnosis:  Coronary Stents NSTEMI     After initial evaluation and assessments completed: Virtual Based Care may be provided alone or in conjunction with Phase 2 Cardiac Rehab based on patient barriers.: Yes   Diet - low sodium heart healthy   Complete by: As directed    Discharge instructions   Complete by: As directed    Radial Site Care Refer to this sheet in the next few weeks. These instructions provide you with information on caring for yourself after your procedure. Your caregiver may also give you more specific instructions. Your treatment has been planned according to current medical practices, but problems sometimes occur. Call your caregiver if you have any problems or questions after your procedure. HOME CARE INSTRUCTIONS You may shower the day after the procedure. Remove the bandage (dressing) and gently wash the site with plain soap and water. Gently pat the site dry.  Do not apply powder or lotion to the site.  Do not submerge the affected site in water for 3 to 5 days.  Inspect the site at least twice daily.  Do not flex or bend the affected arm for 24 hours.  No lifting over 5 pounds (2.3 kg) for 5 days after your procedure.  Do not drive home if you are discharged the same day of the  procedure. Have someone else drive you.  You may drive 24 hours after the procedure unless otherwise instructed by your caregiver.  What to expect: Any bruising will usually fade within 1 to 2 weeks.  Blood that collects in the tissue (hematoma) may be painful to the touch. It should usually decrease in size and tenderness within 1 to 2 weeks.  SEEK IMMEDIATE MEDICAL CARE IF: You have unusual pain at the radial site.  You have redness, warmth, swelling, or pain at the radial site.  You have drainage (other than a small amount of blood on the dressing).  You have chills.  You have a fever or persistent symptoms for more than 72 hours.  You have a fever and your symptoms suddenly get worse.  Your arm becomes pale, cool, tingly, or numb.  You have heavy bleeding from the site. Hold pressure on the site.   PLEASE DO NOT MISS ANY DOSES OF YOUR PLAVIX!!!!! Also keep a log of you blood pressures and bring back to your follow up appt. Please call the office with any questions.   Patients taking blood thinners should generally stay away from medicines like ibuprofen, Advil, Motrin, naproxen, and Aleve due to risk of stomach bleeding. You may take Tylenol as directed or talk to your primary doctor about alternatives.   PLEASE ENSURE THAT  YOU DO NOT RUN OUT OF YOUR PLAVIX. This medication is very important to remain on for at least one year. IF you have issues obtaining this medication due to cost please CALL the office 3-5 business days prior to running out in order to prevent missing doses of this medication.   Increase activity slowly   Complete by: As directed        Discharge Medications   Allergies as of 03/21/2022       Reactions   Other Other (See Comments)   Pt unable to eat any red meat.  Any beta blocker   Aloe Rash   Statins Other (See Comments)   Makes joints hurt   Amoxicillin Rash   Has patient had a PCN reaction causing immediate rash, facial/tongue/throat swelling, SOB or  lightheadedness with hypotension: Yes Has patient had a PCN reaction causing severe rash involving mucus membranes or skin necrosis: No Has patient had a PCN reaction that required hospitalization No Has patient had a PCN reaction occurring within the last 10 years: No If all of the above answers are "NO", then may proceed with Cephalosporin use.   Cephalexin Other (See Comments)   "couldn't sleep". Recently completed therapy on 09/16/13 without rash or itching.        Medication List     TAKE these medications    albuterol 108 (90 Base) MCG/ACT inhaler Commonly known as: VENTOLIN HFA SMARTSIG:1 Puff(s) By Mouth Every 4 Hours PRN   aspirin EC 81 MG tablet Take 81 mg by mouth daily. Notes to patient: Blood thinner  Prevents clotting in the stent and heart attack    clopidogrel 75 MG tablet Commonly known as: PLAVIX Take 1 tablet (75 mg total) by mouth daily with breakfast. Notes to patient: Blood thinner  Prevents clotting in the stent and heart attack    docusate sodium 100 MG capsule Commonly known as: COLACE Take 100 mg by mouth as needed. Equate Stool softener brand as needed  Patient last dose approximately 09/11/2013.   ezetimibe 10 MG tablet Commonly known as: ZETIA Take 10 mg by mouth daily. Notes to patient: Lowers cholesterol    Farxiga 10 MG Tabs tablet Generic drug: dapagliflozin propanediol Take 1 tablet (10 mg total) by mouth daily. Notes to patient: Treats heart failure (weak heart muscle)   fluticasone 50 MCG/ACT nasal spray Commonly known as: FLONASE Place 1 spray into both nostrils daily. Notes to patient: Prevents nasal congestion    hydrochlorothiazide 25 MG tablet Commonly known as: HYDRODIURIL Take 1 tablet (25 mg total) by mouth daily as needed (for high blood pressure). What changed:  when to take this reasons to take this   nitroGLYCERIN 0.4 MG SL tablet Commonly known as: NITROSTAT Place 1 tablet (0.4 mg total) under the tongue every 5  (five) minutes x 3 doses as needed for chest pain.   oxyCODONE 5 MG immediate release tablet Commonly known as: Oxy IR/ROXICODONE Take 5 mg by mouth 3 (three) times daily.   pantoprazole 40 MG tablet Commonly known as: PROTONIX Take 1 tablet (40 mg total) by mouth 2 (two) times daily. Notes to patient: Treats acid reflux Prevents stomach bleeding    ROLAIDS PO Take 1 tablet by mouth daily as needed (heartburn, indigestion).   tamsulosin 0.4 MG Caps capsule Commonly known as: FLOMAX Take 1 capsule (0.4 mg total) by mouth daily after breakfast. Notes to patient: Treats urinary retention   Vitamin D (Ergocalciferol) 1.25 MG (50000 UNIT) Caps capsule Commonly known  as: DRISDOL Take 50,000 Units by mouth once a week. Notes to patient: Supplement            Outstanding Labs/Studies   N/a   Duration of Discharge Encounter   Greater than 30 minutes including physician time.  Signed, Reino Bellis, NP 03/21/2022, 10:57 AM

## 2022-03-21 NOTE — Discharge Planning (Addendum)
Conversation with patient and close friends who will help to look out for him. Discussed risk factor modification. Left radial access site is unremarkable. Plan follow-up in 2 weeks.  Primary cardiologist Dr. Jenetta DownerNori Riis. Consider PCSK9 therapy. Phase 2 cardiac rehab recommended but not excepted.

## 2022-03-21 NOTE — Telephone Encounter (Signed)
Hello,   The Pharmacy team is conducting a discharge transitions of care quality improvement initiative. The recommendations below are for your consideration.     Erik Alvarado is a 71 y.o. male (MRN: [id number], DOB: Mar 16, 1951) who was recently hospitalized on 03/17/2022 for NSTEMI. They are anticipated to visit your clinic for post-discharge follow-up and may benefit from assistance with medication initiation and/or access.     Relevant medication access issues which may benefit from further intervention include: -Cost of Entresto and Wilder Glade is $4.30 per month   Please consider the following therapy recommendations at follow-up appointment below:   -Consider adding a beta-blocker if able (he has been have bradycardia during admission) -He describes an intolerance to a number of blood pressure medications and has a list at home.  It is unclear at this time if this list includes and ACE or ARB. He will bring this list to his next visit. Consider an ARB or ACE if able -He has been referred to lipid clinic (statin intolerant)   We appreciate your assistance with the implementation of these recommendations. Please let us know if there is anything we can help you with at this time.      Thank you,      Hildred Laser, PharmD Clinical Pharmacist **Pharmacist phone directory can now be found on [email or web address] ([email or web address]).  Listed under [place].

## 2022-03-21 NOTE — Care Management Important Message (Signed)
Important Message  Patient Details  Name: Erik Alvarado MRN: [id number] Date of Birth: 1950-12-24   Medicare Important Message Given:  Yes  Patient left prior to IM delivery will mail the IM to the patient home address.   Katheryne Gorr 03/21/2022, 3:08 PM

## 2022-03-21 NOTE — Care Management Important Message (Signed)
Important Message  Patient Details  Name: Erik Alvarado MRN: [id number] Date of Birth: 1950-10-12   Medicare Important Message Given:  Yes     Orbie Pyo 03/21/2022, 3:07 PM

## 2022-03-28 ENCOUNTER — Ambulatory Visit (INDEPENDENT_AMBULATORY_CARE_PROVIDER_SITE_OTHER): Payer: Medicare Other | Admitting: Nurse Practitioner

## 2022-03-28 ENCOUNTER — Encounter: Payer: Self-pay | Admitting: Nurse Practitioner

## 2022-03-28 VITALS — BP 126/67 | HR 76 | Ht 72.0 in | Wt 167.0 lb

## 2022-03-28 DIAGNOSIS — I255 Ischemic cardiomyopathy: Secondary | ICD-10-CM

## 2022-03-28 DIAGNOSIS — Z720 Tobacco use: Secondary | ICD-10-CM

## 2022-03-28 DIAGNOSIS — E785 Hyperlipidemia, unspecified: Secondary | ICD-10-CM

## 2022-03-28 DIAGNOSIS — I2510 Atherosclerotic heart disease of native coronary artery without angina pectoris: Secondary | ICD-10-CM | POA: Diagnosis not present
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
## 2022-03-28 NOTE — Patient Instructions (Signed)
Medication Instructions:  Your physician recommends that you continue on your current medications as directed. Please refer to the Current Medication list given to you today.   *If you need a refill on your cardiac medications before your next appointment, please call your pharmacy*   Lab Work: NONE ordered at this time of appointment   If you have labs (blood work) drawn today and your tests are completely normal, you will receive your results only by: [place] (if you have MyChart) OR A paper copy in the mail If you have any lab test that is abnormal or we need to change your treatment, we will call you to review the results.   Testing/Procedures: NONE ordered at this time of appointment     Follow-Up: At [facility], you and your health needs are our priority.  As part of our continuing mission to provide you with exceptional heart care, we have created designated Provider Care Teams.  These Care Teams include your primary Cardiologist (physician) and Advanced Practice Providers (APPs -  Physician Assistants and Nurse Practitioners) who all work together to provide you with the care you need, when you need it.  We recommend signing up for the patient portal called "MyChart".  Sign up information is provided on this After Visit Summary.  MyChart is used to connect with patients for Virtual Visits (Telemedicine).  Patients are able to view lab/test results, encounter notes, upcoming appointments, etc.  Non-urgent messages can be sent to your provider as well.   To learn more about what you can do with MyChart, go to [email or web address].    Your next appointment:   3-4 month(s)  The format for your next appointment:   In Person  Provider:   Evalina Field, MD  or Diona Browner, NP        Other Instructions Referral sent to PharmD   Important Information About Sugar

## 2022-03-30 ENCOUNTER — Ambulatory Visit: Payer: Medicare Other

## 2022-04-24 ENCOUNTER — Telehealth (HOSPITAL_COMMUNITY): Payer: Self-pay

## 2022-04-24 ENCOUNTER — Telehealth: Payer: Self-pay | Admitting: Cardiovascular Disease

## 2022-04-24 ENCOUNTER — Other Ambulatory Visit (HOSPITAL_COMMUNITY): Payer: Self-pay

## 2022-04-24 MED ORDER — DAPAGLIFLOZIN PROPANEDIOL 10 MG PO TABS: 10.0000 mg | ORAL_TABLET | Freq: Every day | ORAL | 11 refills | Status: DC

## 2022-04-24 NOTE — Telephone Encounter (Signed)
Transitions of Care Pharmacy  ° °Call attempted for a pharmacy transitions of care follow-up. HIPAA appropriate voicemail was left with call back information provided.  ° °Call attempt #1. Will follow-up in 2-3 days.  °  °

## 2022-04-24 NOTE — Telephone Encounter (Signed)
*  STAT* If patient is at the pharmacy, call can be transferred to refill team.   1. Which medications need to be refilled? (please list name of each medication and dose if known)   dapagliflozin propanediol (FARXIGA) 10 MG TABS tablet  2. Which pharmacy/location (including street and city if local pharmacy) is medication to be sent to?  [id number] [place]. Do they need a 30 day or 90 day supply? 90 day  Patient called stating he has 1 tablet left.

## 2022-04-25 ENCOUNTER — Other Ambulatory Visit (HOSPITAL_COMMUNITY): Payer: Self-pay

## 2022-04-25 ENCOUNTER — Telehealth (HOSPITAL_COMMUNITY): Payer: Self-pay

## 2022-04-25 NOTE — Telephone Encounter (Signed)
Pharmacy Transitions of Care Follow-up Telephone Call  Date of discharge: 03/21/2022  Discharge Diagnosis: NSTEMI  How have you been since you were released from the hospital? Patient has been doing well since discharge. He has no questions or concerns regarding his medications.    Medication changes made at discharge: START taking: clopidogrel (PLAVIX)    CHANGE how you take: hydrochlorothiazide (HYDRODIURIL)   Medication changes verified by the patient? Yes    Medication Accessibility:  [facility]   Was the patient provided with refills on discharged medications? Yes   Have all prescriptions been transferred from [facility] to home pharmacy? Yes   Is the patient able to afford medications? Has insurance, Medicare  Notable copays: $1.45 for 90ds    Medication Review:  CLOPIDOGREL (PLAVIX) Clopidogrel 75 mg once daily.  - Advised patient of medications to avoid (NSAIDs, ASA)  - Educated that Tylenol (acetaminophen) will be the preferred analgesic to prevent risk of bleeding  - Emphasized importance of monitoring for signs and symptoms of bleeding (abnormal bruising, prolonged bleeding, nose bleeds, bleeding from gums, discolored urine, black tarry stools)  - Advised patient to alert all providers of anticoagulation therapy prior to starting a new medication or having a procedure   Follow-up Appointments:  [facility] f/u appt confirmed? Saw Diona Browner, NP on 03/28/2022.  If their condition worsens, is the pt aware to call PCP or go to the Emergency Dept.? Yes  Final Patient Assessment: Patient has had follow-up with Cardiology and has refills sent to home pharmacy.

## 2022-05-08 DIAGNOSIS — G894 Chronic pain syndrome: Secondary | ICD-10-CM | POA: Diagnosis not present

## 2022-05-08 DIAGNOSIS — M5459 Other low back pain: Secondary | ICD-10-CM | POA: Diagnosis not present

## 2022-05-08 DIAGNOSIS — Z79891 Long term (current) use of opiate analgesic: Secondary | ICD-10-CM | POA: Diagnosis not present

## 2022-05-08 DIAGNOSIS — M5136 Other intervertebral disc degeneration, lumbar region: Secondary | ICD-10-CM | POA: Diagnosis not present

## 2022-05-08 DIAGNOSIS — M5412 Radiculopathy, cervical region: Secondary | ICD-10-CM | POA: Diagnosis not present

## 2022-05-17 ENCOUNTER — Ambulatory Visit: Payer: Medicare Other | Attending: Cardiology | Admitting: Pharmacist

## 2022-05-17 ENCOUNTER — Telehealth: Payer: Self-pay | Admitting: Pharmacist

## 2022-05-17 DIAGNOSIS — I2510 Atherosclerotic heart disease of native coronary artery without angina pectoris: Secondary | ICD-10-CM

## 2022-05-17 DIAGNOSIS — I10 Essential (primary) hypertension: Secondary | ICD-10-CM | POA: Insufficient documentation

## 2022-05-17 DIAGNOSIS — E782 Mixed hyperlipidemia: Secondary | ICD-10-CM

## 2022-05-17 DIAGNOSIS — I214 Non-ST elevation (NSTEMI) myocardial infarction: Secondary | ICD-10-CM

## 2022-05-17 DIAGNOSIS — M4800 Spinal stenosis, site unspecified: Secondary | ICD-10-CM | POA: Insufficient documentation

## 2022-05-17 DIAGNOSIS — M1990 Unspecified osteoarthritis, unspecified site: Secondary | ICD-10-CM | POA: Insufficient documentation

## 2022-05-17 DIAGNOSIS — I255 Ischemic cardiomyopathy: Secondary | ICD-10-CM

## 2022-05-17 DIAGNOSIS — C679 Malignant neoplasm of bladder, unspecified: Secondary | ICD-10-CM | POA: Insufficient documentation

## 2022-05-17 HISTORY — DX: Spinal stenosis, site unspecified: M48.00

## 2022-05-17 MED ORDER — EZETIMIBE 10 MG PO TABS: 10.0000 mg | ORAL_TABLET | Freq: Every day | ORAL | 3 refills | Status: DC

## 2022-05-17 MED ORDER — ICOSAPENT ETHYL 1 G PO CAPS: 2.0000 g | ORAL_CAPSULE | Freq: Two times a day (BID) | ORAL | 6 refills | Status: DC

## 2022-05-17 MED ORDER — HYDROCHLOROTHIAZIDE 25 MG PO TABS: 25.0000 mg | ORAL_TABLET | Freq: Every day | ORAL | 3 refills | Status: DC | PRN
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
## 2022-05-17 NOTE — Patient Instructions (Addendum)
It was nice meeting you today  We will restart your Praluent after I complete the prior authorization  I have also refilled your Ezetimibe '10mg'$  once a day and your hydrochlorothiazide '25mg'$  once a day  We will recheck your cholesterol in 2-3 months  Please call with any questions  Karren Cobble, PharmD, [place], [place], [place], [place], [place], [address] Phone: [phone], Fax: [phone]

## 2022-05-17 NOTE — Telephone Encounter (Signed)
PA for Praluent submitted.  Key: [id number]

## 2022-05-18 MED ORDER — PRALUENT 75 MG/ML ~~LOC~~ SOAJ: 1.0000 mL | SUBCUTANEOUS | 3 refills | Status: DC

## 2022-05-18 NOTE — Telephone Encounter (Signed)
PA approved until 05/17/23

## 2022-07-31 ENCOUNTER — Ambulatory Visit: Payer: Medicare Other | Attending: Nurse Practitioner | Admitting: Nurse Practitioner
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
## 2022-08-09 DIAGNOSIS — M5412 Radiculopathy, cervical region: Secondary | ICD-10-CM | POA: Diagnosis not present

## 2022-08-09 DIAGNOSIS — G894 Chronic pain syndrome: Secondary | ICD-10-CM | POA: Diagnosis not present

## 2022-08-09 DIAGNOSIS — M5136 Other intervertebral disc degeneration, lumbar region: Secondary | ICD-10-CM | POA: Diagnosis not present

## 2022-08-09 DIAGNOSIS — M5459 Other low back pain: Secondary | ICD-10-CM | POA: Diagnosis not present

## 2022-08-17 ENCOUNTER — Encounter: Payer: Self-pay | Admitting: Nurse Practitioner

## 2022-10-17 ENCOUNTER — Other Ambulatory Visit: Payer: Self-pay | Admitting: Internal Medicine

## 2022-11-30 ENCOUNTER — Telehealth: Payer: Self-pay | Admitting: *Deleted

## 2022-11-30 NOTE — Telephone Encounter (Signed)
I will forward back to pre op app to see notes from Dr. Audie Box about Plavix.  Pt has been scheduled for tele pre op appt 12/05/22 10:40. Due to procedure date and med hold. Med rec and consent are done.

## 2022-11-30 NOTE — Telephone Encounter (Signed)
   Name: Erik Alvarado  DOB: 1951/08/12  MRN: [id number]  Primary Cardiologist: Evalina Field, MD  Chart reviewed as part of pre-operative protocol coverage. Because of Erik Alvarado's past medical history and time since last visit, he will require a follow-up telephone visit in order to better assess preoperative cardiovascular risk.  Pre-op covering staff: - Please schedule appointment and call patient to inform them. If patient already had an upcoming appointment within acceptable timeframe, please add "pre-op clearance" to the appointment notes so provider is aware. - Please contact requesting surgeon's office via preferred method (i.e, phone, fax) to inform them of need for appointment prior to surgery.   I will have to message Dr. Audie Box to weigh in on DAPT since he would require uninterrupted therapy until 03/2023 per last note.   Elgie Collard, PA-C  11/30/2022, 11:43 AM

## 2022-11-30 NOTE — Telephone Encounter (Signed)
Pt has been scheduled for tele pre op appt 12/05/22 10:40. Due to procedure date and med hold. Med rec and consent are done.      Patient Consent for Virtual Visit        Erik Alvarado has provided verbal consent on 11/30/2022 for a virtual visit (video or telephone).   CONSENT FOR VIRTUAL VISIT FOR:  Erik Alvarado  By participating in this virtual visit I agree to the following:  I hereby voluntarily request, consent and authorize [facility] and its employed or contracted physicians, physician assistants, nurse practitioners or other licensed health care professionals (the Practitioner), to provide me with telemedicine health care services (the "Services") as deemed necessary by the treating Practitioner. I acknowledge and consent to receive the Services by the Practitioner via telemedicine. I understand that the telemedicine visit will involve communicating with the Practitioner through live audiovisual communication technology and the disclosure of certain medical information by electronic transmission. I acknowledge that I have been given the opportunity to request an in-person assessment or other available alternative prior to the telemedicine visit and am voluntarily participating in the telemedicine visit.  I understand that I have the right to withhold or withdraw my consent to the use of telemedicine in the course of my care at any time, without affecting my right to future care or treatment, and that the Practitioner or I may terminate the telemedicine visit at any time. I understand that I have the right to inspect all information obtained and/or recorded in the course of the telemedicine visit and may receive copies of available information for a reasonable fee.  I understand that some of the potential risks of receiving the Services via telemedicine include:  Delay or interruption in medical evaluation due to technological equipment failure or disruption; Information  transmitted may not be sufficient (e.g. poor resolution of images) to allow for appropriate medical decision making by the Practitioner; and/or  In rare instances, security protocols could fail, causing a breach of personal health information.  Furthermore, I acknowledge that it is my responsibility to provide information about my medical history, conditions and care that is complete and accurate to the best of my ability. I acknowledge that Practitioner's advice, recommendations, and/or decision may be based on factors not within their control, such as incomplete or inaccurate data provided by me or distortions of diagnostic images or specimens that may result from electronic transmissions. I understand that the practice of medicine is not an exact science and that Practitioner makes no warranties or guarantees regarding treatment outcomes. I acknowledge that a copy of this consent can be made available to me via my patient portal ([facility]), or I can request a printed copy by calling the office of [facility].    I understand that my insurance will be billed for this visit.   I have read or had this consent read to me. I understand the contents of this consent, which adequately explains the benefits and risks of the Services being provided via telemedicine.  I have been provided ample opportunity to ask questions regarding this consent and the Services and have had my questions answered to my satisfaction. I give my informed consent for the services to be provided through the use of telemedicine in my medical care

## 2022-11-30 NOTE — Telephone Encounter (Signed)
   Pre-operative Risk Assessment    Patient Name: Erik Alvarado  DOB: 1950-11-12 MRN: [id number]      Request for Surgical Clearance    Procedure:   CERVICAL SELECTIVE NERVE ROOT BLOCK  Date of Surgery:  Clearance 12/12/22                                 Surgeon:   Surgeon's Group or Practice Name:  [facility] Phone number:  [phone] Fax number:  [phone]   Type of Clearance Requested:   - Pharmacy:  Hold Clopidogrel (Plavix) X'S 7 DAYS   Type of Anesthesia:  Not Indicated   Additional requests/questions:    Astrid Divine   11/30/2022, 10:25 AM
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
## 2022-12-05 ENCOUNTER — Encounter: Payer: Self-pay | Admitting: Nurse Practitioner

## 2022-12-05 ENCOUNTER — Ambulatory Visit: Payer: Medicare Other | Attending: Cardiology | Admitting: Nurse Practitioner

## 2022-12-05 DIAGNOSIS — Z01810 Encounter for preprocedural cardiovascular examination: Secondary | ICD-10-CM

## 2022-12-11 ENCOUNTER — Other Ambulatory Visit: Payer: Self-pay | Admitting: Nurse Practitioner

## 2022-12-11 DIAGNOSIS — E782 Mixed hyperlipidemia: Secondary | ICD-10-CM

## 2022-12-11 DIAGNOSIS — I214 Non-ST elevation (NSTEMI) myocardial infarction: Secondary | ICD-10-CM

## 2022-12-11 DIAGNOSIS — I2510 Atherosclerotic heart disease of native coronary artery without angina pectoris: Secondary | ICD-10-CM

## 2023-01-01 ENCOUNTER — Ambulatory Visit: Payer: Medicare Other | Admitting: Internal Medicine

## 2023-01-01 ENCOUNTER — Encounter: Payer: Self-pay | Admitting: Internal Medicine

## 2023-01-01 VITALS — BP 124/88 | HR 85 | Temp 97.0°F | Resp 18 | Ht 72.0 in | Wt 154.5 lb

## 2023-01-01 DIAGNOSIS — E782 Mixed hyperlipidemia: Secondary | ICD-10-CM | POA: Diagnosis not present

## 2023-01-01 DIAGNOSIS — C679 Malignant neoplasm of bladder, unspecified: Secondary | ICD-10-CM

## 2023-01-01 DIAGNOSIS — Z1331 Encounter for screening for depression: Secondary | ICD-10-CM

## 2023-01-01 DIAGNOSIS — R3916 Straining to void: Secondary | ICD-10-CM

## 2023-01-01 DIAGNOSIS — Z6820 Body mass index (BMI) 20.0-20.9, adult: Secondary | ICD-10-CM

## 2023-01-01 DIAGNOSIS — Z131 Encounter for screening for diabetes mellitus: Secondary | ICD-10-CM

## 2023-01-01 DIAGNOSIS — I25118 Atherosclerotic heart disease of native coronary artery with other forms of angina pectoris: Secondary | ICD-10-CM

## 2023-01-01 DIAGNOSIS — Z0000 Encounter for general adult medical examination without abnormal findings: Secondary | ICD-10-CM | POA: Diagnosis not present

## 2023-01-01 DIAGNOSIS — I10 Essential (primary) hypertension: Secondary | ICD-10-CM | POA: Diagnosis not present

## 2023-01-01 DIAGNOSIS — N401 Enlarged prostate with lower urinary tract symptoms: Secondary | ICD-10-CM | POA: Diagnosis not present

## 2023-01-01 DIAGNOSIS — N400 Enlarged prostate without lower urinary tract symptoms: Secondary | ICD-10-CM

## 2023-01-01 DIAGNOSIS — Z951 Presence of aortocoronary bypass graft: Secondary | ICD-10-CM

## 2023-01-01 DIAGNOSIS — J449 Chronic obstructive pulmonary disease, unspecified: Secondary | ICD-10-CM | POA: Diagnosis not present

## 2023-01-01 DIAGNOSIS — Z720 Tobacco use: Secondary | ICD-10-CM

## 2023-01-01 DIAGNOSIS — G8929 Other chronic pain: Secondary | ICD-10-CM

## 2023-01-01 HISTORY — DX: Encounter for screening for diabetes mellitus: Z13.1

## 2023-01-01 HISTORY — DX: Benign prostatic hyperplasia without lower urinary tract symptoms: N40.0

## 2023-01-01 MED ORDER — FLUTICASONE PROPIONATE 50 MCG/ACT NA SUSP: 1.0000 | Freq: Every day | NASAL | 2 refills | Status: AC

## 2023-01-01 MED ORDER — BREZTRI AEROSPHERE 160-9-4.8 MCG/ACT IN AERO: 2.0000 | INHALATION_SPRAY | Freq: Two times a day (BID) | RESPIRATORY_TRACT | 6 refills | Status: AC

## 2023-01-01 MED ORDER — ALBUTEROL SULFATE HFA 108 (90 BASE) MCG/ACT IN AERS: 2.0000 | INHALATION_SPRAY | Freq: Four times a day (QID) | RESPIRATORY_TRACT | 6 refills | Status: AC | PRN

## 2023-01-01 NOTE — Assessment & Plan Note (Signed)
He will let us know when he is ready to quit smoking

## 2023-01-01 NOTE — Assessment & Plan Note (Signed)
I will add breztrie along with albuterol

## 2023-01-01 NOTE — Assessment & Plan Note (Signed)
He is fasting today for lab drawn
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
## 2023-01-01 NOTE — Assessment & Plan Note (Addendum)
He is on flomax and Will do PSA today

## 2023-01-02 LAB — LIPID PANEL
Chol/HDL Ratio: 3.5 ratio (ref 0.0–5.0)
Cholesterol, Total: 154 mg/dL (ref 100–199)
HDL: 44 mg/dL (ref >39)
LDL Chol Calc (NIH): 91 mg/dL (ref 0–99)
Triglycerides: 101 mg/dL (ref 0–149)
VLDL Cholesterol Cal: 19 mg/dL (ref 5–40)

## 2023-01-02 LAB — CMP14 + ANION GAP
ALT: 13 IU/L (ref 0–44)
AST: 19 IU/L (ref 0–40)
Albumin/Globulin Ratio: 1.6 (ref 1.2–2.2)
Albumin: 4.1 g/dL (ref 3.8–4.8)
Alkaline Phosphatase: 73 IU/L (ref 44–121)
Anion Gap: 16 mmol/L (ref 10.0–18.0)
BUN/Creatinine Ratio: 9 — ABNORMAL LOW (ref 10–24)
BUN: 8 mg/dL (ref 8–27)
Bilirubin Total: 0.4 mg/dL (ref 0.0–1.2)
CO2: 21 mmol/L (ref 20–29)
Calcium: 9.4 mg/dL (ref 8.6–10.2)
Chloride: 103 mmol/L (ref 96–106)
Creatinine, Ser: 0.93 mg/dL (ref 0.76–1.27)
Globulin, Total: 2.5 g/dL (ref 1.5–4.5)
Glucose: 112 mg/dL — ABNORMAL HIGH (ref 70–99)
Potassium: 4.9 mmol/L (ref 3.5–5.2)
Sodium: 140 mmol/L (ref 134–144)
Total Protein: 6.6 g/dL (ref 6.0–8.5)
eGFR: 87 mL/min/{1.73_m2} (ref >59)

## 2023-01-02 LAB — PSA: Prostate Specific Ag, Serum: 7.2 ng/mL — ABNORMAL HIGH (ref 0.0–4.0)

## 2023-01-02 LAB — HEMOGLOBIN A1C
Est. average glucose Bld gHb Est-mCnc: 117 mg/dL
Hgb A1c MFr Bld: 5.7 % — ABNORMAL HIGH (ref 4.8–5.6)

## 2023-01-02 NOTE — Assessment & Plan Note (Signed)
stable °

## 2023-01-02 NOTE — Assessment & Plan Note (Signed)
Will do HbA1c today 

## 2023-01-02 NOTE — Assessment & Plan Note (Signed)
Stable and he will continue to follow with cardiologist

## 2023-01-02 NOTE — Assessment & Plan Note (Signed)
He will continue to follow with pain clinic

## 2023-01-08 ENCOUNTER — Other Ambulatory Visit: Payer: Self-pay | Admitting: Internal Medicine

## 2023-01-10 ENCOUNTER — Other Ambulatory Visit: Payer: Self-pay | Admitting: Internal Medicine

## 2023-01-11 ENCOUNTER — Other Ambulatory Visit: Payer: Self-pay

## 2023-01-11 MED ORDER — VITAMIN D (ERGOCALCIFEROL) 1.25 MG (50000 UNIT) PO CAPS: 50000.0000 [IU] | ORAL_CAPSULE | ORAL | 3 refills | Status: DC

## 2023-01-26 ENCOUNTER — Telehealth: Payer: Self-pay | Admitting: *Deleted

## 2023-01-26 MED ORDER — CLOPIDOGREL BISULFATE 75 MG PO TABS: 75.0000 mg | ORAL_TABLET | Freq: Every day | ORAL | 3 refills | Status: DC

## 2023-01-26 NOTE — Telephone Encounter (Signed)
   Primary Cardiologist: Reatha Harps, MD  Chart reviewed as part of pre-operative protocol coverage. Given past medical history and time since last visit, based on ACC/AHA guidelines, Erik Alvarado would be at acceptable risk for the planned procedure without further cardiovascular testing.   Patient was advised that if he develops new symptoms prior to surgery to contact our office to arrange a follow-up appointment.  He verbalized understanding.  He was previously cleared for a different procedure on 12/05/22 with a functional capacity and METS of 6.05 according to the [facility] activity status index.  His perioperative risk of major cardiac event is 6.6 according to the revised cardiac risk index. I contacted him to ensure no changes and he has no concerning symptoms.   Per office protocol, he may Plavix for 5 days prior to procedure. Ideally aspirin should be continued without interruption, however if the bleeding risk is too great, aspirin may be held for 5-7 days prior to surgery. Please resume aspirin post operatively when it is felt to be safe from a bleeding standpoint.   I will route this recommendation to the requesting party via Epic fax function and remove from pre-op pool.  Please call with questions.  @ME1 @ 01/26/2023, 2:49 PM

## 2023-01-26 NOTE — Telephone Encounter (Signed)
   Pre-operative Risk Assessment    Patient Name: Erik Alvarado  DOB: [birth year] MRN: [id number]      Request for Surgical Clearance    Procedure:   CYSTOSCOPY, TRANSURETHRAL, RESECTION OF BLADDER TUMOR  Date of Surgery:  Clearance 02/15/23                                 Surgeon:  DR. Debroah Baller Surgeon's Group or Practice Name:  [facility] Phone number:  [phone] Fax number:  [phone]   Type of Clearance Requested:   - Medical ; ASA x 3 DAYS PRIOR AND RESUME 3 DAYS POST PROCEDURE   Type of Anesthesia:  General    Additional requests/questions:    [name]   01/26/2023, 1:00 PM

## 2023-02-15 DIAGNOSIS — R001 Bradycardia, unspecified: Secondary | ICD-10-CM | POA: Diagnosis not present

## 2023-02-15 DIAGNOSIS — I4510 Unspecified right bundle-branch block: Secondary | ICD-10-CM | POA: Diagnosis not present

## 2023-03-20 ENCOUNTER — Telehealth: Payer: Self-pay

## 2023-03-20 NOTE — Telephone Encounter (Signed)
Pharmacy Patient Advocate Encounter   Received notification from CoverMyMeds that prior authorization for Praluent 75MG /ML auto-injectors is required/requested.   Insurance verification completed.   The patient is insured through [facility] [place] .  PA submitted to [facility] Medicaid via CoverMyMeds Key/confirmation #/EOC [id number] Status is pending

## 2023-03-20 NOTE — Telephone Encounter (Signed)
Pharmacy Patient Advocate Encounter  Received notification from [facility] Medicaid that Prior Authorization for Praluent has been APPROVED from 03/06/2023 to Further Notice..  PA #/Case ID/Reference #: [id number]

## 2023-04-04 ENCOUNTER — Ambulatory Visit: Payer: Medicare Other | Admitting: Internal Medicine

## 2023-05-07 ENCOUNTER — Other Ambulatory Visit: Payer: Self-pay | Admitting: Nurse Practitioner

## 2023-05-07 DIAGNOSIS — I2510 Atherosclerotic heart disease of native coronary artery without angina pectoris: Secondary | ICD-10-CM

## 2023-05-07 DIAGNOSIS — I10 Essential (primary) hypertension: Secondary | ICD-10-CM

## 2023-05-07 DIAGNOSIS — E782 Mixed hyperlipidemia: Secondary | ICD-10-CM

## 2023-05-07 DIAGNOSIS — I214 Non-ST elevation (NSTEMI) myocardial infarction: Secondary | ICD-10-CM

## 2023-06-13 ENCOUNTER — Telehealth: Payer: Self-pay | Admitting: Nurse Practitioner

## 2023-06-13 DIAGNOSIS — E782 Mixed hyperlipidemia: Secondary | ICD-10-CM

## 2023-06-13 DIAGNOSIS — I214 Non-ST elevation (NSTEMI) myocardial infarction: Secondary | ICD-10-CM

## 2023-06-13 DIAGNOSIS — I2510 Atherosclerotic heart disease of native coronary artery without angina pectoris: Secondary | ICD-10-CM

## 2023-06-13 MED ORDER — PRALUENT 75 MG/ML ~~LOC~~ SOAJ
1.0000 mL | SUBCUTANEOUS | 2 refills | Status: DC
Start: 2023-06-13 — End: 2024-04-07

## 2023-06-13 NOTE — Telephone Encounter (Signed)
Spoke with pt. Pts refill was sent into his pharmacy. Pt is aware that he will need an appointment for additional refills. Pt was reminded that he can call the pharmacy to requesting refills in the future and it's ok for his PCP to refill medications when needed.

## 2023-06-13 NOTE — Telephone Encounter (Signed)
Stopped by this morning requesting refill on PRALUENT  Says he was trying to call in request, but was on hold for a long time and could never get through. Also asking if his provider with [facility] could get the ok to send refill in since it takes a while for call to be answered. Patient verified phone number on chart and pharmacy as correct.

## 2023-06-19 NOTE — Progress Notes (Signed)
Cardiology Office Note:  .   Date:  06/26/2023  ID:  Benita Stabile, DOB 1950-10-21, MRN [id number] PCP: Lonie Peak, Cordelia Poche  [facility] Providers Cardiologist:  Reatha Harps, MD {  History of Present Illness: .   Erik Alvarado is a 72 y.o. male with a past medical history of CAD, HTN, COPD, BPH, bladder cancer, tobacco use. Patient is followed by Dr. Flora Lipps and presents today for preoperative evaluation   Per chart review, patient patient has a long history of coronary artery disease.  Reportedly had 3 stents placed between 1996 and 2010.  Later had CABG x 4 in 2014 with LIMA-LAD, SVG-OM 1/2 (Y graft), SVG-PDA.  Went on to have POBA to the proximal/mid RCA and DES to [facility] in 2017.  It was completed at [facility].  Cath at that time also showed an occluded SVG-PDA.  Patient was admitted in 03/2022 with an NSTEMI.  He had presented to the [facility] ED on 03/16/2022 complaining of chest pain.  Found to have elevated troponins.  Overall, presentation was consistent with NSTEMI.  He underwent cardiac catheterization on 03/17/2022 that noted severe multivessel CAD, LIMA is atretic, occluded vein graft to RCA. He was treated with PCI/DES of the native left circumflex and OM branch. Later that admission, he underwent staged intervention to severe stenosis of the proximal body of vein graft to first OM with DES.  It was recommended that patient continue DAPT with aspirin, Plavix for at least 1 year.  Echocardiogram at that time showed EF 60-65%, no regional wall motion abnormalities, normal RV function, mild mitral regurg, mild aortic valve stenosis.  Patient was seen virtually on 12/05/2022 for preop evaluation prior to cervical selective nerve root block.  At that time, patient was doing well from a cardiac perspective.  It was felt that he was an acceptable risk for the planned procedure without further cardiovascular testing.  Today, patient presents for preoperative evaluation.  Reports  that he has been diagnosed with urethral/prostate cancer.  He is scheduled for prostate biopsies.  He has been doing very well from a heart perspective.  He denies chest pain, shortness of breath, palpitations.  His physical activity is somewhat limited due to neck and back pain.  However, he is able to walk up and down stairs, do housework, and ride an exercise bike for up to an hour without chest pain or shortness of breath.  He sometimes has dizziness, but only if he stands up too quickly.  Denies syncope, near syncope.  If he sits for too long, drinks too much fluids in a day, or eats too much salt, he has ankle swelling.  Ankle swelling goes away if he elevates his feet.  Has been going on for about 1 year. Reports that his diastolic BP is often elevated at home   ROS: Patient reports neck, back pain.  Denies chest pain, shortness of breath,, syncope, near syncope, palpitations.  Denies hematuria.  Studies Reviewed: .   Cardiac Studies & Procedures   CARDIAC CATHETERIZATION  CARDIAC CATHETERIZATION 03/20/2022  Narrative   Prox Graft to Mid Graft lesion before 1st Mrg  is 85% stenosed.   1st Mrg lesion is 95% stenosed.  Severe stenosis in the proximal body of the vein graft to the first obtuse marginal branch. Successful PTCA/DES x 1 to the vein graft to OM1 using distal embolic protection.  Recommendation: Continue DAPT with ASA and Plavix for at least one year.  Findings Coronary Findings Diagnostic  Cardiology Office Note:  .   Date:  06/26/2023  ID:  Benita Stabile, DOB 1950-10-21, MRN [id number] PCP: Lonie Peak, Cordelia Poche  [facility] Providers Cardiologist:  Reatha Harps, MD {  History of Present Illness: .   Erik Alvarado is a 72 y.o. male with a past medical history of CAD, HTN, COPD, BPH, bladder cancer, tobacco use. Patient is followed by Dr. Flora Lipps and presents today for preoperative evaluation   Per chart review, patient patient has a long history of coronary artery disease.  Reportedly had 3 stents placed between 1996 and 2010.  Later had CABG x 4 in 2014 with LIMA-LAD, SVG-OM 1/2 (Y graft), SVG-PDA.  Went on to have POBA to the proximal/mid RCA and DES to [facility] in 2017.  It was completed at [facility].  Cath at that time also showed an occluded SVG-PDA.  Patient was admitted in 03/2022 with an NSTEMI.  He had presented to the [facility] ED on 03/16/2022 complaining of chest pain.  Found to have elevated troponins.  Overall, presentation was consistent with NSTEMI.  He underwent cardiac catheterization on 03/17/2022 that noted severe multivessel CAD, LIMA is atretic, occluded vein graft to RCA. He was treated with PCI/DES of the native left circumflex and OM branch. Later that admission, he underwent staged intervention to severe stenosis of the proximal body of vein graft to first OM with DES.  It was recommended that patient continue DAPT with aspirin, Plavix for at least 1 year.  Echocardiogram at that time showed EF 60-65%, no regional wall motion abnormalities, normal RV function, mild mitral regurg, mild aortic valve stenosis.  Patient was seen virtually on 12/05/2022 for preop evaluation prior to cervical selective nerve root block.  At that time, patient was doing well from a cardiac perspective.  It was felt that he was an acceptable risk for the planned procedure without further cardiovascular testing.  Today, patient presents for preoperative evaluation.  Reports  that he has been diagnosed with urethral/prostate cancer.  He is scheduled for prostate biopsies.  He has been doing very well from a heart perspective.  He denies chest pain, shortness of breath, palpitations.  His physical activity is somewhat limited due to neck and back pain.  However, he is able to walk up and down stairs, do housework, and ride an exercise bike for up to an hour without chest pain or shortness of breath.  He sometimes has dizziness, but only if he stands up too quickly.  Denies syncope, near syncope.  If he sits for too long, drinks too much fluids in a day, or eats too much salt, he has ankle swelling.  Ankle swelling goes away if he elevates his feet.  Has been going on for about 1 year. Reports that his diastolic BP is often elevated at home   ROS: Patient reports neck, back pain.  Denies chest pain, shortness of breath,, syncope, near syncope, palpitations.  Denies hematuria.  Studies Reviewed: .   Cardiac Studies & Procedures   CARDIAC CATHETERIZATION  CARDIAC CATHETERIZATION 03/20/2022  Narrative   Prox Graft to Mid Graft lesion before 1st Mrg  is 85% stenosed.   1st Mrg lesion is 95% stenosed.  Severe stenosis in the proximal body of the vein graft to the first obtuse marginal branch. Successful PTCA/DES x 1 to the vein graft to OM1 using distal embolic protection.  Recommendation: Continue DAPT with ASA and Plavix for at least one year.  Findings Coronary Findings Diagnostic  Cardiology Office Note:  .   Date:  06/26/2023  ID:  Benita Stabile, DOB 1950-10-21, MRN [id number] PCP: Lonie Peak, Cordelia Poche  [facility] Providers Cardiologist:  Reatha Harps, MD {  History of Present Illness: .   Erik Alvarado is a 72 y.o. male with a past medical history of CAD, HTN, COPD, BPH, bladder cancer, tobacco use. Patient is followed by Dr. Flora Lipps and presents today for preoperative evaluation   Per chart review, patient patient has a long history of coronary artery disease.  Reportedly had 3 stents placed between 1996 and 2010.  Later had CABG x 4 in 2014 with LIMA-LAD, SVG-OM 1/2 (Y graft), SVG-PDA.  Went on to have POBA to the proximal/mid RCA and DES to [facility] in 2017.  It was completed at [facility].  Cath at that time also showed an occluded SVG-PDA.  Patient was admitted in 03/2022 with an NSTEMI.  He had presented to the [facility] ED on 03/16/2022 complaining of chest pain.  Found to have elevated troponins.  Overall, presentation was consistent with NSTEMI.  He underwent cardiac catheterization on 03/17/2022 that noted severe multivessel CAD, LIMA is atretic, occluded vein graft to RCA. He was treated with PCI/DES of the native left circumflex and OM branch. Later that admission, he underwent staged intervention to severe stenosis of the proximal body of vein graft to first OM with DES.  It was recommended that patient continue DAPT with aspirin, Plavix for at least 1 year.  Echocardiogram at that time showed EF 60-65%, no regional wall motion abnormalities, normal RV function, mild mitral regurg, mild aortic valve stenosis.  Patient was seen virtually on 12/05/2022 for preop evaluation prior to cervical selective nerve root block.  At that time, patient was doing well from a cardiac perspective.  It was felt that he was an acceptable risk for the planned procedure without further cardiovascular testing.  Today, patient presents for preoperative evaluation.  Reports  that he has been diagnosed with urethral/prostate cancer.  He is scheduled for prostate biopsies.  He has been doing very well from a heart perspective.  He denies chest pain, shortness of breath, palpitations.  His physical activity is somewhat limited due to neck and back pain.  However, he is able to walk up and down stairs, do housework, and ride an exercise bike for up to an hour without chest pain or shortness of breath.  He sometimes has dizziness, but only if he stands up too quickly.  Denies syncope, near syncope.  If he sits for too long, drinks too much fluids in a day, or eats too much salt, he has ankle swelling.  Ankle swelling goes away if he elevates his feet.  Has been going on for about 1 year. Reports that his diastolic BP is often elevated at home   ROS: Patient reports neck, back pain.  Denies chest pain, shortness of breath,, syncope, near syncope, palpitations.  Denies hematuria.  Studies Reviewed: .   Cardiac Studies & Procedures   CARDIAC CATHETERIZATION  CARDIAC CATHETERIZATION 03/20/2022  Narrative   Prox Graft to Mid Graft lesion before 1st Mrg  is 85% stenosed.   1st Mrg lesion is 95% stenosed.  Severe stenosis in the proximal body of the vein graft to the first obtuse marginal branch. Successful PTCA/DES x 1 to the vein graft to OM1 using distal embolic protection.  Recommendation: Continue DAPT with ASA and Plavix for at least one year.  Findings Coronary Findings Diagnostic  Cardiology Office Note:  .   Date:  06/26/2023  ID:  Benita Stabile, DOB 1950-10-21, MRN [id number] PCP: Lonie Peak, Cordelia Poche  [facility] Providers Cardiologist:  Reatha Harps, MD {  History of Present Illness: .   Erik Alvarado is a 72 y.o. male with a past medical history of CAD, HTN, COPD, BPH, bladder cancer, tobacco use. Patient is followed by Dr. Flora Lipps and presents today for preoperative evaluation   Per chart review, patient patient has a long history of coronary artery disease.  Reportedly had 3 stents placed between 1996 and 2010.  Later had CABG x 4 in 2014 with LIMA-LAD, SVG-OM 1/2 (Y graft), SVG-PDA.  Went on to have POBA to the proximal/mid RCA and DES to [facility] in 2017.  It was completed at [facility].  Cath at that time also showed an occluded SVG-PDA.  Patient was admitted in 03/2022 with an NSTEMI.  He had presented to the [facility] ED on 03/16/2022 complaining of chest pain.  Found to have elevated troponins.  Overall, presentation was consistent with NSTEMI.  He underwent cardiac catheterization on 03/17/2022 that noted severe multivessel CAD, LIMA is atretic, occluded vein graft to RCA. He was treated with PCI/DES of the native left circumflex and OM branch. Later that admission, he underwent staged intervention to severe stenosis of the proximal body of vein graft to first OM with DES.  It was recommended that patient continue DAPT with aspirin, Plavix for at least 1 year.  Echocardiogram at that time showed EF 60-65%, no regional wall motion abnormalities, normal RV function, mild mitral regurg, mild aortic valve stenosis.  Patient was seen virtually on 12/05/2022 for preop evaluation prior to cervical selective nerve root block.  At that time, patient was doing well from a cardiac perspective.  It was felt that he was an acceptable risk for the planned procedure without further cardiovascular testing.  Today, patient presents for preoperative evaluation.  Reports  that he has been diagnosed with urethral/prostate cancer.  He is scheduled for prostate biopsies.  He has been doing very well from a heart perspective.  He denies chest pain, shortness of breath, palpitations.  His physical activity is somewhat limited due to neck and back pain.  However, he is able to walk up and down stairs, do housework, and ride an exercise bike for up to an hour without chest pain or shortness of breath.  He sometimes has dizziness, but only if he stands up too quickly.  Denies syncope, near syncope.  If he sits for too long, drinks too much fluids in a day, or eats too much salt, he has ankle swelling.  Ankle swelling goes away if he elevates his feet.  Has been going on for about 1 year. Reports that his diastolic BP is often elevated at home   ROS: Patient reports neck, back pain.  Denies chest pain, shortness of breath,, syncope, near syncope, palpitations.  Denies hematuria.  Studies Reviewed: .   Cardiac Studies & Procedures   CARDIAC CATHETERIZATION  CARDIAC CATHETERIZATION 03/20/2022  Narrative   Prox Graft to Mid Graft lesion before 1st Mrg  is 85% stenosed.   1st Mrg lesion is 95% stenosed.  Severe stenosis in the proximal body of the vein graft to the first obtuse marginal branch. Successful PTCA/DES x 1 to the vein graft to OM1 using distal embolic protection.  Recommendation: Continue DAPT with ASA and Plavix for at least one year.  Findings Coronary Findings Diagnostic  to Mid Graft lesion before 1st Mrg  is 85% stenosed. Prox Graft to Mid Graft lesion between 1st Mrg and 2nd Mrg  is 100% stenosed.  LIMA Graft To Mid LAD Prox Graft to Mid Graft lesion is 100% stenosed.  Intervention  Ost Cx to Prox Cx lesion Stent Pre-stent angioplasty was performed. A drug-eluting stent was successfully placed. Post-Intervention Lesion Assessment The intervention was successful. Pre-interventional TIMI flow is 0. Post-intervention TIMI flow is 3. There is a 0% residual stenosis post intervention.  2nd Mrg lesion Stent A stent was successfully placed. Post-Intervention Lesion Assessment The intervention was successful. Pre-interventional TIMI flow is 0. Post-intervention TIMI flow is 3. There is a 0% residual stenosis post intervention.   STRESS  TESTS  NM MYOCAR MULTI W/SPECT W 06/10/2016  Narrative CLINICAL DATA:  Chest pain  EXAM: MYOCARDIAL IMAGING WITH SPECT (REST AND PHARMACOLOGIC-STRESS)  GATED LEFT VENTRICULAR WALL MOTION STUDY  LEFT VENTRICULAR EJECTION FRACTION  TECHNIQUE: Standard myocardial SPECT imaging was performed after resting intravenous injection of 10 mCi Tc-4m tetrofosmin. Subsequently, intravenous infusion of Lexiscan was performed under the supervision of the Cardiology staff. At peak effect of the drug, 30 mCi Tc-76m tetrofosmin was injected intravenously and standard myocardial SPECT imaging was performed. Quantitative gated imaging was also performed to evaluate left ventricular wall motion, and estimate left ventricular ejection fraction.  COMPARISON:  None.  FINDINGS: Perfusion: There is a large fixed perfusion defect involving the entire inferior wall, the adjacent septum, in the adjacent lateral wall. There is probably a small area of stress-induced perfusion defect at the lateral extent of the fixed defect.  Wall Motion: Normal left ventricular wall motion. No left ventricular dilation.  Left Ventricular Ejection Fraction: 67 %  End diastolic volume 60 ml  End systolic volume 20 ml  IMPRESSION: 1. There is a large fixed perfusion defect in the inferior wall and adjoining walls without LV dilatation.  2. Normal left ventricular wall motion.  3. Left ventricular ejection fraction 67%  4. Non invasive risk stratification*: Low risk. Please note that there is a large fixed perfusion defect in the inferior wall but there is no LV dilatation.  *2012 Appropriate Use Criteria for Coronary Revascularization Focused Update: J Am Coll Cardiol. 2012;59(9):857-881. http://content.dementiazones.com.aspx?articleid=1201161   Electronically Signed By: Jolaine Click M.D. On: 06/10/2016 12:57   ECHOCARDIOGRAM  ECHOCARDIOGRAM COMPLETE 03/19/2022  Narrative ECHOCARDIOGRAM  REPORT    Patient Name:   BRYCIN KILLE Date of Exam: 03/19/2022 Medical Rec #:  [id number]        Height:       72.0 in Accession #:    [id number]       Weight:       166.7 lb Date of Birth:  1950/09/24        BSA:          1.972 m Patient Age:    70 years         BP:           85/61 mmHg Patient Gender: M                HR:           78 bpm. Exam Location:  Inpatient  Procedure: 2D Echo, Cardiac Doppler and Color Doppler  Indications:    CAD  History:        Patient has prior history of Echocardiogram examinations, most recent 08/09/2013. CAD; Cardiac cath 03/17/22 and Prior CABG.  Sonographer:    Roosvelt Maser  Cardiology Office Note:  .   Date:  06/26/2023  ID:  Benita Stabile, DOB 1950-10-21, MRN [id number] PCP: Lonie Peak, Cordelia Poche  [facility] Providers Cardiologist:  Reatha Harps, MD {  History of Present Illness: .   Erik Alvarado is a 72 y.o. male with a past medical history of CAD, HTN, COPD, BPH, bladder cancer, tobacco use. Patient is followed by Dr. Flora Lipps and presents today for preoperative evaluation   Per chart review, patient patient has a long history of coronary artery disease.  Reportedly had 3 stents placed between 1996 and 2010.  Later had CABG x 4 in 2014 with LIMA-LAD, SVG-OM 1/2 (Y graft), SVG-PDA.  Went on to have POBA to the proximal/mid RCA and DES to [facility] in 2017.  It was completed at [facility].  Cath at that time also showed an occluded SVG-PDA.  Patient was admitted in 03/2022 with an NSTEMI.  He had presented to the [facility] ED on 03/16/2022 complaining of chest pain.  Found to have elevated troponins.  Overall, presentation was consistent with NSTEMI.  He underwent cardiac catheterization on 03/17/2022 that noted severe multivessel CAD, LIMA is atretic, occluded vein graft to RCA. He was treated with PCI/DES of the native left circumflex and OM branch. Later that admission, he underwent staged intervention to severe stenosis of the proximal body of vein graft to first OM with DES.  It was recommended that patient continue DAPT with aspirin, Plavix for at least 1 year.  Echocardiogram at that time showed EF 60-65%, no regional wall motion abnormalities, normal RV function, mild mitral regurg, mild aortic valve stenosis.  Patient was seen virtually on 12/05/2022 for preop evaluation prior to cervical selective nerve root block.  At that time, patient was doing well from a cardiac perspective.  It was felt that he was an acceptable risk for the planned procedure without further cardiovascular testing.  Today, patient presents for preoperative evaluation.  Reports  that he has been diagnosed with urethral/prostate cancer.  He is scheduled for prostate biopsies.  He has been doing very well from a heart perspective.  He denies chest pain, shortness of breath, palpitations.  His physical activity is somewhat limited due to neck and back pain.  However, he is able to walk up and down stairs, do housework, and ride an exercise bike for up to an hour without chest pain or shortness of breath.  He sometimes has dizziness, but only if he stands up too quickly.  Denies syncope, near syncope.  If he sits for too long, drinks too much fluids in a day, or eats too much salt, he has ankle swelling.  Ankle swelling goes away if he elevates his feet.  Has been going on for about 1 year. Reports that his diastolic BP is often elevated at home   ROS: Patient reports neck, back pain.  Denies chest pain, shortness of breath,, syncope, near syncope, palpitations.  Denies hematuria.  Studies Reviewed: .   Cardiac Studies & Procedures   CARDIAC CATHETERIZATION  CARDIAC CATHETERIZATION 03/20/2022  Narrative   Prox Graft to Mid Graft lesion before 1st Mrg  is 85% stenosed.   1st Mrg lesion is 95% stenosed.  Severe stenosis in the proximal body of the vein graft to the first obtuse marginal branch. Successful PTCA/DES x 1 to the vein graft to OM1 using distal embolic protection.  Recommendation: Continue DAPT with ASA and Plavix for at least one year.  Findings Coronary Findings Diagnostic

## 2023-06-26 ENCOUNTER — Ambulatory Visit: Payer: Medicare Other | Attending: Cardiology | Admitting: Cardiology

## 2023-06-26 ENCOUNTER — Encounter: Payer: Self-pay | Admitting: Cardiology

## 2023-06-26 VITALS — BP 118/84 | HR 73 | Ht 71.0 in | Wt 159.2 lb

## 2023-06-26 DIAGNOSIS — I2510 Atherosclerotic heart disease of native coronary artery without angina pectoris: Secondary | ICD-10-CM | POA: Diagnosis present

## 2023-06-26 DIAGNOSIS — E785 Hyperlipidemia, unspecified: Secondary | ICD-10-CM | POA: Diagnosis present

## 2023-06-26 DIAGNOSIS — R600 Localized edema: Secondary | ICD-10-CM | POA: Diagnosis present

## 2023-06-26 DIAGNOSIS — I340 Nonrheumatic mitral (valve) insufficiency: Secondary | ICD-10-CM | POA: Diagnosis present

## 2023-06-26 DIAGNOSIS — Z720 Tobacco use: Secondary | ICD-10-CM | POA: Insufficient documentation

## 2023-06-26 DIAGNOSIS — E782 Mixed hyperlipidemia: Secondary | ICD-10-CM | POA: Diagnosis present

## 2023-06-26 DIAGNOSIS — Z01810 Encounter for preprocedural cardiovascular examination: Secondary | ICD-10-CM | POA: Insufficient documentation

## 2023-06-26 DIAGNOSIS — I350 Nonrheumatic aortic (valve) stenosis: Secondary | ICD-10-CM | POA: Insufficient documentation

## 2023-06-26 DIAGNOSIS — I10 Essential (primary) hypertension: Secondary | ICD-10-CM | POA: Diagnosis present

## 2023-06-26 MED ORDER — HYDROCHLOROTHIAZIDE 12.5 MG PO CAPS: 12.5000 mg | ORAL_CAPSULE | Freq: Every day | ORAL | 3 refills | Status: DC

## 2023-06-26 NOTE — Patient Instructions (Signed)
Medication Instructions:  Stop Zetia Decrease hydrochlorothiazide 12.5 mg once a day. *If you need a refill on your cardiac medications before your next appointment, please call your pharmacy*   Lab Work: Today: Lipid panel, and BMP 2 Weeks: BMP If you have labs (blood work) drawn today and your tests are completely normal, you will receive your results only by: MyChart Message (if you have MyChart) OR A paper copy in the mail If you have any lab test that is abnormal or we need to change your treatment, we will call you to review the results.   Testing/Procedures: No testing   Follow-Up: At [facility], you and your health needs are our priority.  As part of our continuing mission to provide you with exceptional heart care, we have created designated Provider Care Teams.  These Care Teams include your primary Cardiologist (physician) and Advanced Practice Providers (APPs -  Physician Assistants and Nurse Practitioners) who all work together to provide you with the care you need, when you need it.  We recommend signing up for the patient portal called "MyChart".  Sign up information is provided on this After Visit Summary.  MyChart is used to connect with patients for Virtual Visits (Telemedicine).  Patients are able to view lab/test results, encounter notes, upcoming appointments, etc.  Non-urgent messages can be sent to your provider as well.   To learn more about what you can do with MyChart, go to [email or web address].    Your next appointment:   3-4 month(s)  Provider:   Any APP

## 2023-06-27 LAB — LIPID PANEL
Chol/HDL Ratio: 3.1 {ratio} (ref 0.0–5.0)
Cholesterol, Total: 125 mg/dL (ref 100–199)
HDL: 40 mg/dL (ref >39)
LDL Chol Calc (NIH): 73 mg/dL (ref 0–99)
Triglycerides: 57 mg/dL (ref 0–149)
VLDL Cholesterol Cal: 12 mg/dL (ref 5–40)

## 2023-06-27 LAB — BASIC METABOLIC PANEL
BUN/Creatinine Ratio: 8 — ABNORMAL LOW (ref 10–24)
BUN: 7 mg/dL — ABNORMAL LOW (ref 8–27)
CO2: 24 mmol/L (ref 20–29)
Calcium: 9.4 mg/dL (ref 8.6–10.2)
Chloride: 104 mmol/L (ref 96–106)
Creatinine, Ser: 0.88 mg/dL (ref 0.76–1.27)
Glucose: 105 mg/dL — ABNORMAL HIGH (ref 70–99)
Potassium: 4.7 mmol/L (ref 3.5–5.2)
Sodium: 142 mmol/L (ref 134–144)
eGFR: 90 mL/min/{1.73_m2} (ref >59)

## 2023-08-02 ENCOUNTER — Telehealth: Payer: Self-pay | Admitting: *Deleted

## 2023-08-02 NOTE — Telephone Encounter (Signed)
   Pre-operative Risk Assessment    Patient Name: Erik Alvarado  DOB: [birth year] MRN: [id number]  DATE OF LAST VISIT: 06/26/23 Robet Leu, PAC DATE OF NEXT VISIT: 09/27/23 Micah Flesher, [facility]    Request for Surgical Clearance    Procedure:   PROSTATE Bx  Date of Surgery:  Clearance TBD                                 Surgeon: NOT LISTED Surgeon's Group or Practice Name:  [facility] Phone number:  [phone] Fax number:  [phone]   Type of Clearance Requested:   - Medical  - Pharmacy:  Hold Aspirin and Clopidogrel (Plavix) x 3 DAYS PRIOR   Type of Anesthesia:  Not Indicated   Additional requests/questions:    [name]   08/02/2023, 5:09 PM

## 2023-08-07 NOTE — Telephone Encounter (Signed)
   Patient Name: CALVION DELEE  DOB: Jul 28, 1951 MRN: [id number]  Primary Cardiologist: Reatha Harps, MD  Chart reviewed as part of pre-operative protocol coverage. Pre-op clearance already addressed by colleagues in earlier phone notes. To summarize recommendations:  - Looks like I saw him for preoperative evaluation before prostate biopsy- as long as he is not having any new symptoms, OK to proceed  -Robet Leu, PA-C  Patient is okay to hold Plavix and aspirin x 3 days prior to procedure and resume when medically safe to do so.  Will route this bundled recommendation to requesting provider via Epic fax function and remove from pre-op pool. Please call with questions.  Sharlene Dory, PA-C 08/07/2023, 2:15 PM

## 2023-08-24 ENCOUNTER — Inpatient Hospital Stay: Payer: Medicare Other | Attending: Oncology | Admitting: Oncology

## 2023-08-24 ENCOUNTER — Other Ambulatory Visit: Payer: Self-pay

## 2023-08-24 ENCOUNTER — Encounter: Payer: Self-pay | Admitting: Oncology

## 2023-08-24 ENCOUNTER — Inpatient Hospital Stay: Payer: Medicare Other

## 2023-08-24 VITALS — BP 122/103 | HR 80 | Temp 97.8°F | Resp 16 | Ht 70.9 in | Wt 159.9 lb

## 2023-08-24 DIAGNOSIS — C678 Malignant neoplasm of overlapping sites of bladder: Secondary | ICD-10-CM | POA: Diagnosis not present

## 2023-08-24 DIAGNOSIS — Z23 Encounter for immunization: Secondary | ICD-10-CM | POA: Diagnosis not present

## 2023-08-24 DIAGNOSIS — Z800 Family history of malignant neoplasm of digestive organs: Secondary | ICD-10-CM

## 2023-08-24 DIAGNOSIS — F17210 Nicotine dependence, cigarettes, uncomplicated: Secondary | ICD-10-CM

## 2023-08-24 DIAGNOSIS — Z79899 Other long term (current) drug therapy: Secondary | ICD-10-CM | POA: Diagnosis not present

## 2023-08-24 DIAGNOSIS — C679 Malignant neoplasm of bladder, unspecified: Secondary | ICD-10-CM | POA: Diagnosis present

## 2023-08-24 LAB — CMP (CANCER CENTER ONLY)
ALT: 9 U/L (ref 0–44)
AST: 16 U/L (ref 15–41)
Albumin: 4 g/dL (ref 3.5–5.0)
Alkaline Phosphatase: 84 U/L (ref 38–126)
Anion gap: 11 (ref 5–15)
BUN: 10 mg/dL (ref 8–23)
CO2: 25 mmol/L (ref 22–32)
Calcium: 9.4 mg/dL (ref 8.9–10.3)
Chloride: 104 mmol/L (ref 98–111)
Creatinine: 0.98 mg/dL (ref 0.61–1.24)
GFR, Estimated: >60 mL/min (ref >60)
Glucose, Bld: 112 mg/dL — ABNORMAL HIGH (ref 70–99)
Potassium: 3.8 mmol/L (ref 3.5–5.1)
Sodium: 139 mmol/L (ref 135–145)
Total Bilirubin: 0.5 mg/dL (ref <1.2)
Total Protein: 6.7 g/dL (ref 6.5–8.1)

## 2023-08-24 LAB — CBC WITH DIFFERENTIAL (CANCER CENTER ONLY)
Abs Immature Granulocytes: 0.02 10*3/uL (ref 0.00–0.07)
Basophils Absolute: 0.1 10*3/uL (ref 0.0–0.1)
Basophils Relative: 1 %
Eosinophils Absolute: 0.2 10*3/uL (ref 0.0–0.5)
Eosinophils Relative: 2 %
HCT: 44.9 % (ref 39.0–52.0)
Hemoglobin: 15.5 g/dL (ref 13.0–17.0)
Immature Granulocytes: 0 %
Lymphocytes Relative: 20 %
Lymphs Abs: 1.7 10*3/uL (ref 0.7–4.0)
MCH: 33.2 pg (ref 26.0–34.0)
MCHC: 34.5 g/dL (ref 30.0–36.0)
MCV: 96.1 fL (ref 80.0–100.0)
Monocytes Absolute: 0.8 10*3/uL (ref 0.1–1.0)
Monocytes Relative: 10 %
Neutro Abs: 5.8 10*3/uL (ref 1.7–7.7)
Neutrophils Relative %: 67 %
Platelet Count: 216 10*3/uL (ref 150–400)
RBC: 4.67 MIL/uL (ref 4.22–5.81)
RDW: 12.9 % (ref 11.5–15.5)
WBC Count: 8.6 10*3/uL (ref 4.0–10.5)
nRBC: 0 % (ref 0.0–0.2)
nRBC: 0 /100{WBCs}
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
## 2023-08-24 NOTE — Telephone Encounter (Signed)
Disregard, opened in error

## 2023-08-24 NOTE — Progress Notes (Signed)
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

## 2023-08-25 ENCOUNTER — Other Ambulatory Visit: Payer: Self-pay

## 2023-08-30 ENCOUNTER — Inpatient Hospital Stay: Payer: Medicare Other

## 2023-08-30 ENCOUNTER — Encounter: Payer: Self-pay | Admitting: Oncology

## 2023-08-30 NOTE — Progress Notes (Signed)
[facility] Clinical Social Work  Initial Assessment   Erik Alvarado is a 72 y.o. year old male contacted by phone. Clinical Social Work was referred by new patient protocol for assessment of psychosocial needs.   SDOH (Social Determinants of Health) assessments performed: Yes SDOH Interventions    Flowsheet Row Office Visit from 08/24/2023 in [facility] [place] - A Dept Of [place]  SDOH Interventions   Food Insecurity Interventions Intervention Not Indicated  Housing Interventions Intervention Not Indicated  Utilities Interventions Intervention Not Indicated       SDOH Screenings   Food Insecurity: No Food Insecurity (08/24/2023)  Housing: Unknown (08/24/2023)  Transportation Needs: No Transportation Needs (08/24/2023)  Utilities: Not At Risk (08/24/2023)  Depression (PHQ2-9): Low Risk  (01/01/2023)  Tobacco Use: High Risk (08/24/2023)     Distress Screen completed: No     No data to display            Family/Social Information:  Housing Arrangement: patient lives alone Family members/support persons in your life? Family Transportation concerns: no  Employment: Disabled  .  Income source: Special educational needs teacher Income Financial concerns: No Type of concern:  Patient is able to pay necessary bills, but is on limited income. Food access concerns: no Religious or spiritual practice:  Spiritual Practices  that bring comfort Services Currently in place:  Spiritual Rituals,     Coping/ Adjustment to diagnosis: Patient understands treatment plan and what happens next? yes Concerns about diagnosis and/or treatment:  How his loved ones are handling the diagnosis.  Patient reported stressors: Adjusting to my illness Current coping skills/ strengths: Ability for insight , Active sense of humor , Average or above average intelligence , Capable of independent living , Communication skills , General fund of knowledge , Motivation for treatment/growth ,  and Supportive family/friends     SUMMARY: Current SDOH Barriers:  No SDOH barriers at this time.   Clinical Social Work Clinical Goal(s):  No clinical social work goals at this time  Interventions: Discussed common feeling and emotions when being diagnosed with cancer, and the importance of support during treatment Informed patient of the support team roles and support services at [facility] Provided CSW contact information and encouraged patient to call with any questions or concerns  Follow Up Plan: Patient will contact CSW with any support or resource needs Patient verbalizes understanding of plan: Yes   Marguerita Merles, LCSW Clinical Social Worker [facility]

## 2023-08-31 ENCOUNTER — Other Ambulatory Visit: Payer: Self-pay | Admitting: Oncology

## 2023-08-31 ENCOUNTER — Inpatient Hospital Stay: Payer: Medicare Other

## 2023-08-31 ENCOUNTER — Encounter: Payer: Self-pay | Admitting: Hematology and Oncology

## 2023-08-31 ENCOUNTER — Inpatient Hospital Stay (HOSPITAL_BASED_OUTPATIENT_CLINIC_OR_DEPARTMENT_OTHER): Payer: Medicare Other | Admitting: Hematology and Oncology

## 2023-08-31 ENCOUNTER — Encounter: Payer: Self-pay | Admitting: Oncology

## 2023-08-31 ENCOUNTER — Other Ambulatory Visit: Payer: Self-pay | Admitting: Hematology and Oncology

## 2023-08-31 VITALS — BP 102/77 | HR 70 | Temp 98.9°F | Resp 18 | Ht 70.9 in | Wt 160.5 lb

## 2023-08-31 DIAGNOSIS — C679 Malignant neoplasm of bladder, unspecified: Secondary | ICD-10-CM

## 2023-08-31 DIAGNOSIS — Z23 Encounter for immunization: Secondary | ICD-10-CM | POA: Diagnosis not present

## 2023-08-31 LAB — CMP (CANCER CENTER ONLY)
ALT: 11 U/L (ref 0–44)
AST: 18 U/L (ref 15–41)
Albumin: 4.2 g/dL (ref 3.5–5.0)
Alkaline Phosphatase: 81 U/L (ref 38–126)
Anion gap: 11 (ref 5–15)
BUN: 9 mg/dL (ref 8–23)
CO2: 27 mmol/L (ref 22–32)
Calcium: 9.2 mg/dL (ref 8.9–10.3)
Chloride: 104 mmol/L (ref 98–111)
Creatinine: 0.93 mg/dL (ref 0.61–1.24)
GFR, Estimated: >60 mL/min (ref >60)
Glucose, Bld: 116 mg/dL — ABNORMAL HIGH (ref 70–99)
Potassium: 3.7 mmol/L (ref 3.5–5.1)
Sodium: 141 mmol/L (ref 135–145)
Total Bilirubin: 0.3 mg/dL (ref <1.2)
Total Protein: 6.7 g/dL (ref 6.5–8.1)

## 2023-08-31 LAB — TSH: TSH: 2.603 u[IU]/mL (ref 0.350–4.500)

## 2023-08-31 LAB — CBC WITH DIFFERENTIAL (CANCER CENTER ONLY)
Abs Immature Granulocytes: 0.02 10*3/uL (ref 0.00–0.07)
Basophils Absolute: 0.1 10*3/uL (ref 0.0–0.1)
Basophils Relative: 1 %
Eosinophils Absolute: 0.4 10*3/uL (ref 0.0–0.5)
Eosinophils Relative: 4 %
HCT: 43.9 % (ref 39.0–52.0)
Hemoglobin: 15.4 g/dL (ref 13.0–17.0)
Immature Granulocytes: 0 %
Lymphocytes Relative: 28 %
Lymphs Abs: 2.4 10*3/uL (ref 0.7–4.0)
MCH: 33.8 pg (ref 26.0–34.0)
MCHC: 35.1 g/dL (ref 30.0–36.0)
MCV: 96.3 fL (ref 80.0–100.0)
Monocytes Absolute: 0.8 10*3/uL (ref 0.1–1.0)
Monocytes Relative: 9 %
Neutro Abs: 5.1 10*3/uL (ref 1.7–7.7)
Neutrophils Relative %: 58 %
Platelet Count: 229 10*3/uL (ref 150–400)
RBC: 4.56 MIL/uL (ref 4.22–5.81)
RDW: 12.9 % (ref 11.5–15.5)
WBC Count: 8.8 10*3/uL (ref 4.0–10.5)
nRBC: 0 % (ref 0.0–0.2)
nRBC: 0 /100{WBCs}

## 2023-08-31 MED ORDER — ONDANSETRON HCL 8 MG PO TABS: 8.0000 mg | ORAL_TABLET | Freq: Three times a day (TID) | ORAL | 1 refills | Status: DC | PRN

## 2023-08-31 MED ORDER — INFLUENZA VAC A&B SURF ANT ADJ 0.5 ML IM SUSY
0.5000 mL | PREFILLED_SYRINGE | Freq: Once | INTRAMUSCULAR | Status: AC
  Administered 2023-08-31: 0.5 mL via INTRAMUSCULAR
  Filled 2023-08-31: qty 0.5

## 2023-08-31 MED ORDER — INFLUENZA VAC A&B SURF ANT ADJ 0.5 ML IM SUSY
0.5000 mL | PREFILLED_SYRINGE | Freq: Once | INTRAMUSCULAR | Status: DC
Start: 2023-08-31 — End: 2023-08-31
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
## 2023-08-31 NOTE — Patient Instructions (Signed)

## 2023-09-01 LAB — T4: T4, Total: 6.9 ug/dL (ref 4.5–12.0)

## 2023-09-03 ENCOUNTER — Encounter: Payer: Self-pay | Admitting: Hematology and Oncology

## 2023-09-07 ENCOUNTER — Telehealth: Payer: Self-pay

## 2023-09-07 NOTE — Telephone Encounter (Signed)
Darl Pikes Phy,RPH:  There aren't any drug interactions between them so ok to proceed with the praluent

## 2023-09-07 NOTE — Telephone Encounter (Addendum)
Pt called to let us know he got his port placed on Monday. He wants to know when he can restart his Prauluent injection (he takes every 14d)? He states Harvin Hazel was going to find out if it has an interaction with Keytruda. Pt also asking if we could contact his pain clinic physician, Charolett Bumpers @ [phone], to get a few more oxycodone to cover increased pain?

## 2023-09-13 ENCOUNTER — Other Ambulatory Visit: Payer: Self-pay

## 2023-09-13 DIAGNOSIS — R5383 Other fatigue: Secondary | ICD-10-CM

## 2023-09-13 DIAGNOSIS — C679 Malignant neoplasm of bladder, unspecified: Secondary | ICD-10-CM
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
## 2023-09-14 ENCOUNTER — Inpatient Hospital Stay: Payer: Medicare Other

## 2023-09-14 ENCOUNTER — Inpatient Hospital Stay: Payer: Medicare Other | Attending: Oncology

## 2023-09-14 VITALS — BP 103/86 | HR 73 | Temp 98.3°F | Resp 18 | Ht 70.9 in | Wt 161.1 lb

## 2023-09-14 DIAGNOSIS — C679 Malignant neoplasm of bladder, unspecified: Secondary | ICD-10-CM | POA: Diagnosis not present

## 2023-09-14 DIAGNOSIS — Z5112 Encounter for antineoplastic immunotherapy: Secondary | ICD-10-CM | POA: Insufficient documentation

## 2023-09-14 DIAGNOSIS — Z79620 Long term (current) use of immunosuppressive biologic: Secondary | ICD-10-CM | POA: Insufficient documentation

## 2023-09-14 DIAGNOSIS — R5383 Other fatigue: Secondary | ICD-10-CM

## 2023-09-14 LAB — CBC WITH DIFFERENTIAL (CANCER CENTER ONLY)
Abs Immature Granulocytes: 0.03 10*3/uL (ref 0.00–0.07)
Basophils Absolute: 0.1 10*3/uL (ref 0.0–0.1)
Basophils Relative: 1 %
Eosinophils Absolute: 0.3 10*3/uL (ref 0.0–0.5)
Eosinophils Relative: 4 %
HCT: 43 % (ref 39.0–52.0)
Hemoglobin: 14.7 g/dL (ref 13.0–17.0)
Immature Granulocytes: 0 %
Lymphocytes Relative: 22 %
Lymphs Abs: 1.8 10*3/uL (ref 0.7–4.0)
MCH: 32.7 pg (ref 26.0–34.0)
MCHC: 34.2 g/dL (ref 30.0–36.0)
MCV: 95.6 fL (ref 80.0–100.0)
Monocytes Absolute: 0.8 10*3/uL (ref 0.1–1.0)
Monocytes Relative: 9 %
Neutro Abs: 5.3 10*3/uL (ref 1.7–7.7)
Neutrophils Relative %: 64 %
Platelet Count: 246 10*3/uL (ref 150–400)
RBC: 4.5 MIL/uL (ref 4.22–5.81)
RDW: 12.8 % (ref 11.5–15.5)
WBC Count: 8.3 10*3/uL (ref 4.0–10.5)
nRBC: 0 % (ref 0.0–0.2)
nRBC: 0 /100{WBCs}

## 2023-09-14 LAB — CMP (CANCER CENTER ONLY)
ALT: 10 U/L (ref 0–44)
AST: 17 U/L (ref 15–41)
Albumin: 4.2 g/dL (ref 3.5–5.0)
Alkaline Phosphatase: 75 U/L (ref 38–126)
Anion gap: 11 (ref 5–15)
BUN: 12 mg/dL (ref 8–23)
CO2: 26 mmol/L (ref 22–32)
Calcium: 9 mg/dL (ref 8.9–10.3)
Chloride: 104 mmol/L (ref 98–111)
Creatinine: 0.9 mg/dL (ref 0.61–1.24)
GFR, Estimated: >60 mL/min (ref >60)
Glucose, Bld: 158 mg/dL — ABNORMAL HIGH (ref 70–99)
Potassium: 3.7 mmol/L (ref 3.5–5.1)
Sodium: 141 mmol/L (ref 135–145)
Total Bilirubin: 0.5 mg/dL (ref 0.0–1.2)
Total Protein: 6.7 g/dL (ref 6.5–8.1)

## 2023-09-14 LAB — TSH: TSH: 3.025 u[IU]/mL (ref 0.350–4.500)

## 2023-09-14 MED ORDER — HEPARIN SOD (PORK) LOCK FLUSH 100 UNIT/ML IV SOLN: 500.0000 [IU] | Freq: Once | INTRAVENOUS | Status: DC | PRN

## 2023-09-14 MED ORDER — SODIUM CHLORIDE 0.9% FLUSH: 10.0000 mL | INTRAVENOUS | Status: DC | PRN

## 2023-09-14 MED ORDER — SODIUM CHLORIDE 0.9 % IV SOLN
200.0000 mg | Freq: Once | INTRAVENOUS | Status: AC
  Administered 2023-09-14: 200 mg via INTRAVENOUS
  Filled 2023-09-14: qty 8

## 2023-09-14 MED ORDER — SODIUM CHLORIDE 0.9 % IV SOLN: INTRAVENOUS | Status: DC

## 2023-09-14 NOTE — Patient Instructions (Signed)

## 2023-09-14 NOTE — Progress Notes (Signed)
..  Pharmacist Chemotherapy Monitoring - Initial Assessment    Anticipated start date: 09/14/23  The following has been reviewed per standard work regarding the patient's treatment regimen: The patient's diagnosis, treatment plan and drug doses, and organ/hematologic function Lab orders and baseline tests specific to treatment regimen  The treatment plan start date, drug sequencing, and pre-medications Prior authorization status  Patient's documented medication list, including drug-drug interaction screen and prescriptions for anti-emetics and supportive care specific to the treatment regimen The drug concentrations, fluid compatibility, administration routes, and timing of the medications to be used The patient's access for treatment and lifetime cumulative dose history, if applicable  The patient's medication allergies and previous infusion related reactions, if applicable   Changes made to treatment plan:  N/A  Follow up needed:  N/A   Devere CHRISTELLA Manzanilla, RPH, 09/14/2023  9:30 AM

## 2023-09-17 ENCOUNTER — Telehealth: Payer: Self-pay

## 2023-09-17 NOTE — Telephone Encounter (Addendum)
 I spoke with pt to see how he is doing since Keytruda  infusion on Friday. Pt states he hasn't been able to keep any food down. He has taken the Zofran  before he eats to see if it helps, but it didn't. He says he will try something like soup and in 15 min it comes right back up. I don't understand it. I'm not nauseated at all. He is able to keep Pedialyte and yogurt (in small bites over extended period of time) down. He denies heartburn, sore throat, SOB, cough, chest pain, skin rash, itching, fever and chills. He mentioned that, I felt kind of shaky when I left there on Friday, but it went away. I reminded pt to call us  if he develops temp of 100.4 or higher, day or night. He verbalized understanding. I confirmed his next appt, 10/05/23 @ 845a.

## 2023-09-17 NOTE — Telephone Encounter (Signed)
 Dr Bernie: We can see him later this week if he doesn't get better   Devere Phy,RPH: Wonder if he caught a norovirus? How's his bowel movements?   Leandro Berkowitz C,RN: No diarrhea, no constipation.  Devere Phy,RPH: vomiting incidence is 2 to 37% although we don't see it very often

## 2023-09-18 ENCOUNTER — Inpatient Hospital Stay: Payer: Medicare Other | Admitting: Dietician

## 2023-09-18 NOTE — Telephone Encounter (Signed)
 09/18/2023 Erik Alvarado, RD: "gave him some tips to try to overcome the nausea. he's feeling a bit better today, maybe it is norovirus. I'm going to call Thursday morning. If he doesn't improve at all I'll have him call office".

## 2023-09-18 NOTE — Progress Notes (Signed)
 Nutrition Assessment: Reached out to patient at home telephone number.    Reason for Assessment: vomiting   ASSESSMENT:  Patient is a 73 y.o. male with recurrent superficial bladder tumor.  Medical history notable for COPD, coronary artery disease with stents, tobacco use, GERD, hyperlipidemia, right bundle branch block.  He started treatment with Keytruda  on 09/13/22.  He report hasn't been able to keep anything down since Saturday.  He tried chicken noodle soup, plain rice.  He has been drinking Pedialyte, diet green teat, tolerates yogurt in small quantities.  Just started attempting Boost ONS today and has kept it down and 1 small pancake.  He reports feeling much better today. He has some fresh ginger in his refrigerator and willing to add to sprite or try ginger tea with honey.  Hasn't had a bowel movement since Saturday, Doesn't like prune juice, willing to try stool softener.  He states he just feels like he has nothing to move through cleaned out from vomiting.    Nutrition Focused Physical Exam: unable to perform NFPE   Medications: using Zofran  every 8 hours, gummy MVI, gummy hair skin and nails, hasn't taken and stool softener or laxative   Labs: reviewed 09/13/22   Anthropometrics:    Height: 70 Weight: 161#  BMI: 22.53   Estimated Energy Needs  Kcals: 2200-2500 Protein: 88-110 Fluid: 2.5 L   NUTRITION DIAGNOSIS: inadequate intake r/t to vomiting   INTERVENTION:   Reviewed tips to address nausea Suggested increasing oral nutrition supplement TID Encourage small frequent attempt with bland carbs and ONS/yogurts for protein. Emailed Nutrition Tip sheet  for  Nausea with Contact information provided in text to follow up on tolerance   MONITORING, EVALUATION, GOAL: weight trends, nutrition impact symptoms, PO intake, labs   Next Visit: remote 1/9  Micheline Craven, RDN, LDN Registered Dietitian, [facility] Part Time Remote (Usual office hours:  Tuesday-Thursday) Mobile: [phone] Remote Office: [phone]

## 2023-09-20 ENCOUNTER — Inpatient Hospital Stay: Payer: Medicare Other | Admitting: Dietician

## 2023-09-20 NOTE — Progress Notes (Signed)
 Nutrition Follow Up:   Reached out to patient at home telephone.  He reports hasn't been eating a lot, but it is all staying down since yesterday. He did switched to sweet tea, sprite with ginger in it for fluids ran out of Pedialyte but he's going to go out today for more.  Yesterday he tried white rice, can chicken & rice soup (about a cup) some slices of toast.  Overall fluids "Couple of quarts."  He feels so much better he is optimistic he'll be back to normal tomorrow.   Anthropometrics:   Height: 70 Weight: 161#  BMI: 22.53   Estimated Energy Needs  Kcals: 2200-2500 Protein: 88-110 Fluid: 2.5 L   NUTRITION DIAGNOSIS: inadequate intake r/t to vomiting.  Resolving, no vomiting yesterday   INTERVENTION:   Encourage small frequent attempt with bland carbs and lean proteins.  Encouraged diligence with fluids  MONITORING, EVALUATION, GOAL: weight trends, nutrition impact symptoms, PO intake, labs   Next Visit: remote after next infusion  Micheline Craven, RDN, LDN Registered Dietitian, [facility] Part Time Remote (Usual office hours: Tuesday-Thursday) Mobile: [phone]

## 2023-09-21 NOTE — Telephone Encounter (Signed)
 09/20/23  Dinger, Montie CROME, RD  Martell Greig ORN, RN Sounded so much better today.  He kept everything down yesterday and is being very attentive to hydrating. I was worried about dehydration and not being able to get to care tomorrow.  I'll touch base remotely after his next treatment.

## 2023-09-27 ENCOUNTER — Ambulatory Visit: Payer: Medicare Other | Admitting: Physician Assistant

## 2023-09-28 ENCOUNTER — Encounter: Payer: Self-pay | Admitting: Oncology

## 2023-10-04 NOTE — Progress Notes (Signed)
[facility]  [address] [place],  [place]  [address] [phone]  Clinic Day:  10/05/2023  Referring physician: Lonie Peak, PA-C   HISTORY OF PRESENT ILLNESS:  The patient is a 73 y.o. 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  He comes in today to be evaluated before heading into his 2nd cycle of pembrolizumab.  The patient tolerated his 1st cycle of pembrolizumab fairly well.  He had nausea and rare vomiting for the first 4-5 days after his first cycle of pembrolizumab immunotherapy.  However, since then, he has been doing very well.  As a pertains to his disease, he denies having hematuria, increased urinary frequency, or other urinary symptoms which concern him for overt signs of disease progression.  This patient's bladder cancer history dates back to at least 2014.  Over time, he has had multiple bladder tumor resections.  He has also been exposed to BCG for his previously early-stage bladder cancers.  As his disease has returned and is now high-grade in nature, he is now on pembrolizumab for his disease management.  PHYSICAL EXAM:  Blood pressure 109/72, pulse 64, temperature 98.5 F (36.9 C), temperature source Oral, resp. rate 16, height 5' 10.9" (1.801 m), weight 158 lb 1.6 oz (71.7 kg), SpO2 97%. Wt Readings from Last 3 Encounters:  10/05/23 158 lb 1.6 oz (71.7 kg)  09/14/23 161 lb 1.3 oz (73.1 kg)  08/31/23 160 lb 8 oz (72.8 kg)   Body mass index is 22.11 kg/m. Performance status (ECOG): 1 - Symptomatic but completely ambulatory Physical Exam Constitutional:      Appearance: Normal appearance. He is not ill-appearing.  HENT:     Mouth/Throat:     Mouth: Mucous membranes are moist.     Pharynx: Oropharynx is clear. No oropharyngeal exudate or posterior oropharyngeal erythema.  Cardiovascular:     Rate and Rhythm: Normal rate and regular rhythm.     Heart sounds: No murmur heard.    No friction rub. No gallop.   Pulmonary:     Effort: Pulmonary effort is normal. No respiratory distress.     Breath sounds: Normal breath sounds. No wheezing, rhonchi or rales.  Abdominal:     General: Bowel sounds are normal. There is no distension.     Palpations: Abdomen is soft. There is no mass.     Tenderness: There is no abdominal tenderness.  Musculoskeletal:        General: No swelling.     Right lower leg: No edema.     Left lower leg: No edema.  Lymphadenopathy:     Cervical: No cervical adenopathy.     Upper Body:     Right upper body: No supraclavicular or axillary adenopathy.     Left upper body: No supraclavicular or axillary adenopathy.     Lower Body: No right inguinal adenopathy. No left inguinal adenopathy.  Skin:    General: Skin is warm.     Coloration: Skin is not jaundiced.     Findings: No lesion or rash.  Neurological:     General: No focal deficit present.     Mental Status: He is alert and oriented to person, place, and time. Mental status is at baseline.  Psychiatric:        Mood and Affect: Mood normal.        Behavior: Behavior normal.        Thought Content: Thought content normal.     LABS:  Latest Ref Rng & Units 10/05/2023    8:42 AM 09/14/2023    8:51 AM 08/31/2023    2:31 PM  CBC  WBC 4.0 - 10.5 K/uL 7.7  8.3  8.8   Hemoglobin 13.0 - 17.0 g/dL 60.4  54.0  98.1   Hematocrit 39.0 - 52.0 % 45.6  43.0  43.9   Platelets 150 - 400 K/uL 196  246  229       Latest Ref Rng & Units 10/05/2023    8:42 AM 09/14/2023    8:51 AM 08/31/2023    2:31 PM  CMP  Glucose 70 - 99 mg/dL 191  478  295   BUN 8 - 23 mg/dL 12  12  9    Creatinine 0.61 - 1.24 mg/dL 6.21  3.08  6.57   Sodium 135 - 145 mmol/L 137  141  141   Potassium 3.5 - 5.1 mmol/L 3.8  3.7  3.7   Chloride 98 - 111 mmol/L 101  104  104   CO2 22 - 32 mmol/L 27  26  27    Calcium 8.9 - 10.3 mg/dL 9.3  9.0  9.2   Total Protein 6.5 - 8.1 g/dL 7.0  6.7  6.7   Total Bilirubin 0.0 - 1.2 mg/dL 0.4  0.5  0.3   Alkaline  Phos 38 - 126 U/L 86  75  81   AST 15 - 41 U/L 18  17  18    ALT 0 - 44 U/L 10  10  11     ASSESSMENT & PLAN:  Assessment/Plan:  A 73 y.o. male with recurrent high-grade, but superficial, bladder cancer.he will proceed with his second cycle of pembrolizumab immunotherapy today.  Overall, the patient appears to be doing fairly well.  I will see him back in 3 weeks before he has into his third cycle of pembrolizumab.  The patient understands all the plans discussed today and knows to contact our office if he runs into any problems that require immediate clinical attention.    Euriah Matlack Kirby Funk, MD

## 2023-10-05 ENCOUNTER — Inpatient Hospital Stay: Payer: Medicare Other

## 2023-10-05 ENCOUNTER — Ambulatory Visit: Payer: Medicare Other | Admitting: Oncology

## 2023-10-05 ENCOUNTER — Ambulatory Visit: Payer: Medicare Other

## 2023-10-05 ENCOUNTER — Other Ambulatory Visit: Payer: Medicare Other

## 2023-10-05 ENCOUNTER — Inpatient Hospital Stay (HOSPITAL_BASED_OUTPATIENT_CLINIC_OR_DEPARTMENT_OTHER): Payer: Medicare Other | Admitting: Oncology

## 2023-10-05 VITALS — BP 109/72 | HR 64 | Temp 98.5°F | Resp 16 | Ht 70.9 in | Wt 158.1 lb

## 2023-10-05 DIAGNOSIS — C679 Malignant neoplasm of bladder, unspecified: Secondary | ICD-10-CM

## 2023-10-05 DIAGNOSIS — Z5112 Encounter for antineoplastic immunotherapy: Secondary | ICD-10-CM | POA: Diagnosis not present

## 2023-10-05 LAB — CMP (CANCER CENTER ONLY)
ALT: 10 U/L (ref 0–44)
AST: 18 U/L (ref 15–41)
Albumin: 4.2 g/dL (ref 3.5–5.0)
Alkaline Phosphatase: 86 U/L (ref 38–126)
Anion gap: 10 (ref 5–15)
BUN: 12 mg/dL (ref 8–23)
CO2: 27 mmol/L (ref 22–32)
Calcium: 9.3 mg/dL (ref 8.9–10.3)
Chloride: 101 mmol/L (ref 98–111)
Creatinine: 0.95 mg/dL (ref 0.61–1.24)
GFR, Estimated: >60 mL/min (ref >60)
Glucose, Bld: 125 mg/dL — ABNORMAL HIGH (ref 70–99)
Potassium: 3.8 mmol/L (ref 3.5–5.1)
Sodium: 137 mmol/L (ref 135–145)
Total Bilirubin: 0.4 mg/dL (ref 0.0–1.2)
Total Protein: 7 g/dL (ref 6.5–8.1)

## 2023-10-05 LAB — CBC WITH DIFFERENTIAL (CANCER CENTER ONLY)
Abs Immature Granulocytes: 0.01 10*3/uL (ref 0.00–0.07)
Basophils Absolute: 0.1 10*3/uL (ref 0.0–0.1)
Basophils Relative: 1 %
Eosinophils Absolute: 0.3 10*3/uL (ref 0.0–0.5)
Eosinophils Relative: 4 %
HCT: 45.6 % (ref 39.0–52.0)
Hemoglobin: 15.5 g/dL (ref 13.0–17.0)
Immature Granulocytes: 0 %
Lymphocytes Relative: 21 %
Lymphs Abs: 1.6 10*3/uL (ref 0.7–4.0)
MCH: 32.4 pg (ref 26.0–34.0)
MCHC: 34 g/dL (ref 30.0–36.0)
MCV: 95.4 fL (ref 80.0–100.0)
Monocytes Absolute: 1.2 10*3/uL — ABNORMAL HIGH (ref 0.1–1.0)
Monocytes Relative: 15 %
Neutro Abs: 4.5 10*3/uL (ref 1.7–7.7)
Neutrophils Relative %: 59 %
Platelet Count: 196 10*3/uL (ref 150–400)
RBC: 4.78 MIL/uL (ref 4.22–5.81)
RDW: 12.9 % (ref 11.5–15.5)
WBC Count: 7.7 10*3/uL (ref 4.0–10.5)
nRBC: 0 % (ref 0.0–0.2)
nRBC: 0 /100{WBCs}

## 2023-10-05 MED ORDER — SODIUM CHLORIDE 0.9% FLUSH
10.0000 mL | INTRAVENOUS | Status: DC | PRN
  Administered 2023-10-05: 10 mL

## 2023-10-05 MED ORDER — SODIUM CHLORIDE 0.9 % IV SOLN: INTRAVENOUS | Status: DC

## 2023-10-05 MED ORDER — SODIUM CHLORIDE 0.9 % IV SOLN
200.0000 mg | Freq: Once | INTRAVENOUS | Status: AC
  Administered 2023-10-05: 200 mg via INTRAVENOUS
  Filled 2023-10-05: qty 8

## 2023-10-05 MED ORDER — HEPARIN SOD (PORK) LOCK FLUSH 100 UNIT/ML IV SOLN
500.0000 [IU] | Freq: Once | INTRAVENOUS | Status: AC | PRN
Start: 2023-10-05 — End: 2023-10-05
  Administered 2023-10-05: 500 [IU]

## 2023-10-05 NOTE — Patient Instructions (Signed)
Pembrolizumab Injection What is this medication? PEMBROLIZUMAB (PEM broe LIZ ue mab) treats some types of cancer. It works by helping your immune system slow or stop the spread of cancer cells. It is a monoclonal antibody. This medicine may be used for other purposes; ask your health care provider or pharmacist if you have questions. COMMON BRAND NAME(S): Keytruda What should I tell my care team before I take this medication? They need to know if you have any of these conditions: Allogeneic stem cell transplant (uses someone else's stem cells) Autoimmune diseases, such as Crohn disease, ulcerative colitis, lupus History of chest radiation Nervous system problems, such as Guillain-Barre syndrome, myasthenia gravis Organ transplant An unusual or allergic reaction to pembrolizumab, other medications, foods, dyes, or preservatives Pregnant or trying to get pregnant Breast-feeding How should I use this medication? This medication is injected into a vein. It is given by your care team in a hospital or clinic setting. A special MedGuide will be given to you before each treatment. Be sure to read this information carefully each time. Talk to your care team about the use of this medication in children. While it may be prescribed for children as young as 6 months for selected conditions, precautions do apply. Overdosage: If you think you have taken too much of this medicine contact a poison control center or emergency room at once. NOTE: This medicine is only for you. Do not share this medicine with others. What if I miss a dose? Keep appointments for follow-up doses. It is important not to miss your dose. Call your care team if you are unable to keep an appointment. What may interact with this medication? Interactions have not been studied. This list may not describe all possible interactions. Give your health care provider a list of all the medicines, herbs, non-prescription drugs, or dietary  supplements you use. Also tell them if you smoke, drink alcohol, or use illegal drugs. Some items may interact with your medicine. What should I watch for while using this medication? Your condition will be monitored carefully while you are receiving this medication. You may need blood work while taking this medication. This medication may cause serious skin reactions. They can happen weeks to months after starting the medication. Contact your care team right away if you notice fevers or flu-like symptoms with a rash. The rash may be red or purple and then turn into blisters or peeling of the skin. You may also notice a red rash with swelling of the face, lips, or lymph nodes in your neck or under your arms. Tell your care team right away if you have any change in your eyesight. Talk to your care team if you may be pregnant. Serious birth defects can occur if you take this medication during pregnancy and for 4 months after the last dose. You will need a negative pregnancy test before starting this medication. Contraception is recommended while taking this medication and for 4 months after the last dose. Your care team can help you find the option that works for you. Do not breastfeed while taking this medication and for 4 months after the last dose. What side effects may I notice from receiving this medication? Side effects that you should report to your care team as soon as possible: Allergic reactions--skin rash, itching, hives, swelling of the face, lips, tongue, or throat Dry cough, shortness of breath or trouble breathing Eye pain, redness, irritation, or discharge with blurry or decreased vision Heart muscle inflammation--unusual weakness or  fatigue, shortness of breath, chest pain, fast or irregular heartbeat, dizziness, swelling of the ankles, feet, or hands Hormone gland problems--headache, sensitivity to light, unusual weakness or fatigue, dizziness, fast or irregular heartbeat, increased  sensitivity to cold or heat, excessive sweating, constipation, hair loss, increased thirst or amount of urine, tremors or shaking, irritability Infusion reactions--chest pain, shortness of breath or trouble breathing, feeling faint or lightheaded Kidney injury (glomerulonephritis)--decrease in the amount of urine, red or dark brown urine, foamy or bubbly urine, swelling of the ankles, hands, or feet Liver injury--right upper belly pain, loss of appetite, nausea, light-colored stool, dark yellow or brown urine, yellowing skin or eyes, unusual weakness or fatigue Pain, tingling, or numbness in the hands or feet, muscle weakness, change in vision, confusion or trouble speaking, loss of balance or coordination, trouble walking, seizures Rash, fever, and swollen lymph nodes Redness, blistering, peeling, or loosening of the skin, including inside the mouth Sudden or severe stomach pain, bloody diarrhea, fever, nausea, vomiting Side effects that usually do not require medical attention (report to your care team if they continue or are bothersome): Bone, joint, or muscle pain Diarrhea Fatigue Loss of appetite Nausea Skin rash This list may not describe all possible side effects. Call your doctor for medical advice about side effects. You may report side effects to FDA at 1-800-FDA-1088. Where should I keep my medication? This medication is given in a hospital or clinic. It will not be stored at home. NOTE: This sheet is a summary. It may not cover all possible information. If you have questions about this medicine, talk to your doctor, pharmacist, or health care provider.  2024 Elsevier/Gold Standard (2022-01-10 00:00:00)  [facility] [place] [facility]  Discharge Instructions: Thank you for choosing [facility] to provide your oncology and hematology care.  If you have a lab appointment with the Cancer Center, please go directly to the Cancer Center  and check in at the registration area.   Wear comfortable clothing and clothing appropriate for easy access to any Portacath or PICC line.   We strive to give you quality time with your provider. You may need to reschedule your appointment if you arrive late (15 or more minutes).  Arriving late affects you and other patients whose appointments are after yours.  Also, if you miss three or more appointments without notifying the office, you may be dismissed from the clinic at the provider's discretion.      For prescription refill requests, have your pharmacy contact our office and allow 72 hours for refills to be completed.    Today you received the following chemotherapy and/or immunotherapy agents PEMBROLIZUMAB      To help prevent nausea and vomiting after your treatment, we encourage you to take your nausea medication as directed.  BELOW ARE SYMPTOMS THAT SHOULD BE REPORTED IMMEDIATELY: *FEVER GREATER THAN 100.4 F (38 C) OR HIGHER *CHILLS OR SWEATING *NAUSEA AND VOMITING THAT IS NOT CONTROLLED WITH YOUR NAUSEA MEDICATION *UNUSUAL SHORTNESS OF BREATH *UNUSUAL BRUISING OR BLEEDING *URINARY PROBLEMS (pain or burning when urinating, or frequent urination) *BOWEL PROBLEMS (unusual diarrhea, constipation, pain near the anus) TENDERNESS IN MOUTH AND THROAT WITH OR WITHOUT PRESENCE OF ULCERS (sore throat, sores in mouth, or a toothache) UNUSUAL RASH, SWELLING OR PAIN  UNUSUAL VAGINAL DISCHARGE OR ITCHING   Items with * indicate a potential emergency and should be followed up as soon as possible or go to the Emergency Department  if any problems should occur.  Please show the CHEMOTHERAPY ALERT CARD or IMMUNOTHERAPY ALERT CARD at check-in to the Emergency Department and triage nurse.  Should you have questions after your visit or need to cancel or reschedule your appointment, please contact [facility] [place] - [facility]  Dept: [phone]  and follow the  prompts.  Office hours are 8:00 a.m. to 4:30 p.m. Monday - Friday. Please note that voicemails left after 4:00 p.m. may not be returned until the following business day.  We are closed weekends and major holidays. You have access to a nurse at all times for urgent questions. Please call the main number to the clinic Dept: [phone] and follow the prompts.  For any non-urgent questions, you may also contact your provider using MyChart. We now offer e-Visits for anyone 78 and older to request care online for non-urgent symptoms. For details visit [email or web address].   Also download the MyChart app! Go to the app store, search "MyChart", open the app, select [facility], and log in with your MyChart username and password.

## 2023-10-06 ENCOUNTER — Other Ambulatory Visit: Payer: Self-pay

## 2023-10-11 ENCOUNTER — Telehealth: Payer: Self-pay | Admitting: Dietician

## 2023-10-11 NOTE — Telephone Encounter (Signed)
Attempted to reach patient for a follow up remote nutrition consult after his last treatment. Provided my cell# on voice mail to return call to follow up on PO intake and NIS.  Gennaro Africa, RDN, LDN Registered Dietitian, [facility] Part Time Remote (Usual office hours: Tuesday-Thursday) Cell: [phone]

## 2023-10-16 ENCOUNTER — Inpatient Hospital Stay: Payer: Medicare Other | Attending: Oncology | Admitting: Dietician

## 2023-10-16 ENCOUNTER — Encounter: Payer: Self-pay | Admitting: Oncology

## 2023-10-16 DIAGNOSIS — Z5112 Encounter for antineoplastic immunotherapy: Secondary | ICD-10-CM | POA: Insufficient documentation

## 2023-10-16 DIAGNOSIS — R748 Abnormal levels of other serum enzymes: Secondary | ICD-10-CM | POA: Insufficient documentation

## 2023-10-16 DIAGNOSIS — Z79620 Long term (current) use of immunosuppressive biologic: Secondary | ICD-10-CM | POA: Insufficient documentation

## 2023-10-16 DIAGNOSIS — C679 Malignant neoplasm of bladder, unspecified: Secondary | ICD-10-CM | POA: Insufficient documentation

## 2023-10-16 NOTE — Progress Notes (Signed)
 Nutrition Follow Up:  Patient called from  home/cell telephone in response to my message last week.  He feels much better since his last treatment.  No concerns with nausea, vomiting or loose bowels.  He reports has been eating a lot of snacks and grazing through out the day on cookies and ice cream and little nutrient dense foods or meals. He is still trying to drink Pedialyte and liquid IV with some soda and water. He reports barrier to eating more healthy is affording food at this time and his prescriptions are increasing in price so food $ are very limited.  Labs: reviewed 10/05/23   Medications: MVI and Vit D  Anthropometrics:  down 2# past month Height: 70 Weight:  10/05/23  158 09/14/23  161#  BMI: 22.11   Estimated Energy Needs  Kcals: 2200-2500 Protein: 88-110 Fluid: 2.5 L   NUTRITION DIAGNOSIS: inadequate intake r/t to vomiting.  Resolved no no vomiting after most recent treatment, some concerns with food insecurity  INTERVENTION:   Encourage use of water to hydrate. Advised he can d/c electrolyte replacements if bowels or regular and no loose stools.  Reviewed some budget friendly options for nourishment like peanut butter sandwiches, frozen dinners to provide better nourishment than his cookies and ice cream.   Encouraged his to ask or a bag from the food pantry at cancer center at next visit.  MONITORING, EVALUATION, GOAL: weight trends, nutrition impact symptoms, PO intake, labs   Next Visit: remote next month  Micheline Craven, RDN, LDN Registered Dietitian, [facility] Part Time Remote (Usual office hours: Tuesday-Thursday) Mobile: [phone]

## 2023-10-17 ENCOUNTER — Other Ambulatory Visit: Payer: Self-pay

## 2023-10-25 ENCOUNTER — Encounter: Payer: Self-pay | Admitting: Surgery

## 2023-10-25 NOTE — Progress Notes (Signed)
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  He comes in today to be evaluated before heading into his 3rd cycle of pembrolizumab.  The patient tolerated his 2nd cycle of pembrolizumab well.  As it pertains to his disease, he denies having hematuria, increased urinary frequency, or other urinary symptoms which concern him for overt signs of disease progression.  This patient's bladder cancer history dates back to at least 2014.  Over time, he has had multiple bladder tumor resections.  He has also been exposed to BCG for his previously early-stage bladder cancers.  As his disease has returned and is now high-grade in nature, he is now on pembrolizumab for his disease management.  PHYSICAL EXAM:  Blood pressure (!) 155/70, pulse 64, temperature 98.4 F (36.9 C), temperature source Oral, resp. rate 16, height 5' 10.9" (1.801 m), weight 156 lb 11.2 oz (71.1 kg), SpO2 98%. Wt Readings from Last 3 Encounters:  10/26/23 156 lb 11.2 oz (71.1 kg)  10/05/23 158 lb 1.6 oz (71.7 kg)  09/14/23 161 lb 1.3 oz (73.1 kg)   Body mass index is 21.92 kg/m. Performance status (ECOG): 1 - Symptomatic but completely ambulatory Physical Exam Constitutional:      Appearance: Normal appearance. He is not ill-appearing.  HENT:     Mouth/Throat:     Mouth: Mucous membranes are moist.     Pharynx: Oropharynx is clear. No oropharyngeal exudate or posterior oropharyngeal erythema.  Cardiovascular:     Rate and Rhythm: Normal rate and regular rhythm.     Heart sounds: No murmur heard.    No friction rub. No gallop.  Pulmonary:     Effort: Pulmonary effort is normal. No respiratory distress.     Breath sounds: Normal breath sounds. No wheezing, rhonchi or rales.  Abdominal:      General: Bowel sounds are normal. There is no distension.     Palpations: Abdomen is soft. There is no mass.     Tenderness: There is no abdominal tenderness.  Musculoskeletal:        General: No swelling.     Right lower leg: No edema.     Left lower leg: No edema.  Lymphadenopathy:     Cervical: No cervical adenopathy.     Upper Body:     Right upper body: No supraclavicular or axillary adenopathy.     Left upper body: No supraclavicular or axillary adenopathy.     Lower Body: No right inguinal adenopathy. No left inguinal adenopathy.  Skin:    General: Skin is warm.     Coloration: Skin is not jaundiced.     Findings: No lesion or rash.  Neurological:     General: No focal deficit present.     Mental Status: He is alert and oriented to person, place, and time. Mental status is at baseline.  Psychiatric:        Mood and Affect: Mood normal.        Behavior: Behavior normal.        Thought Content: Thought content normal.     LABS:      Latest Ref Rng & Units 10/26/2023    9:19 AM 10/05/2023    8:42 AM 09/14/2023    8:51 AM  CBC  WBC 4.0 -  10.5 K/uL 6.7  7.7  8.3   Hemoglobin 13.0 - 17.0 g/dL 16.1  09.6  04.5   Hematocrit 39.0 - 52.0 % 44.8  45.6  43.0   Platelets 150 - 400 K/uL 213  196  246       Latest Ref Rng & Units 10/26/2023    9:19 AM 10/05/2023    8:42 AM 09/14/2023    8:51 AM  CMP  Glucose 70 - 99 mg/dL 409  811  914   BUN 8 - 23 mg/dL 8  12  12    Creatinine 0.61 - 1.24 mg/dL 7.82  9.56  2.13   Sodium 135 - 145 mmol/L 140  137  141   Potassium 3.5 - 5.1 mmol/L 3.8  3.8  3.7   Chloride 98 - 111 mmol/L 103  101  104   CO2 22 - 32 mmol/L 26  27  26    Calcium 8.9 - 10.3 mg/dL 9.2  9.3  9.0   Total Protein 6.5 - 8.1 g/dL 6.7  7.0  6.7   Total Bilirubin 0.0 - 1.2 mg/dL 0.6  0.4  0.5   Alkaline Phos 38 - 126 U/L 80  86  75   AST 15 - 41 U/L 167  18  17   ALT 0 - 44 U/L 40  10  10    ASSESSMENT & PLAN:  Assessment/Plan:  A 73 y.o. male with recurrent  high-grade, but superficial, bladder cancer. He will proceed with his 3rd cycle of pembrolizumab immunotherapy today.  His AST level is the only liver enzyme elevated today.  It will continue to be followed over time to ensure his immunotherapy is not causing any untoward liver toxicity.  Overall, the patient appears to be doing fairly well.  I will see him back in 3 weeks before he heads into his 4th cycle of pembrolizumab.  The patient understands all the plans discussed today and is in agreement with them.   Lucillia Corson Kirby Funk, MD

## 2023-10-26 ENCOUNTER — Inpatient Hospital Stay: Payer: Medicare Other

## 2023-10-26 ENCOUNTER — Telehealth: Payer: Self-pay | Admitting: Oncology

## 2023-10-26 ENCOUNTER — Inpatient Hospital Stay (HOSPITAL_BASED_OUTPATIENT_CLINIC_OR_DEPARTMENT_OTHER): Payer: Medicare Other | Admitting: Oncology

## 2023-10-26 ENCOUNTER — Encounter: Payer: Self-pay | Admitting: Oncology

## 2023-10-26 VITALS — BP 155/70 | HR 64 | Temp 98.4°F | Resp 16 | Ht 70.9 in | Wt 156.7 lb

## 2023-10-26 DIAGNOSIS — C679 Malignant neoplasm of bladder, unspecified: Secondary | ICD-10-CM

## 2023-10-26 DIAGNOSIS — Z5112 Encounter for antineoplastic immunotherapy: Secondary | ICD-10-CM | POA: Diagnosis present

## 2023-10-26 DIAGNOSIS — Z79620 Long term (current) use of immunosuppressive biologic: Secondary | ICD-10-CM | POA: Diagnosis not present

## 2023-10-26 DIAGNOSIS — R748 Abnormal levels of other serum enzymes: Secondary | ICD-10-CM | POA: Diagnosis not present

## 2023-10-26 LAB — CBC WITH DIFFERENTIAL (CANCER CENTER ONLY)
Abs Immature Granulocytes: 0.01 10*3/uL (ref 0.00–0.07)
Basophils Absolute: 0.1 10*3/uL (ref 0.0–0.1)
Basophils Relative: 1 %
Eosinophils Absolute: 0.3 10*3/uL (ref 0.0–0.5)
Eosinophils Relative: 4 %
HCT: 44.8 % (ref 39.0–52.0)
Hemoglobin: 15.5 g/dL (ref 13.0–17.0)
Immature Granulocytes: 0 %
Lymphocytes Relative: 24 %
Lymphs Abs: 1.6 10*3/uL (ref 0.7–4.0)
MCH: 33.3 pg (ref 26.0–34.0)
MCHC: 34.6 g/dL (ref 30.0–36.0)
MCV: 96.1 fL (ref 80.0–100.0)
Monocytes Absolute: 0.7 10*3/uL (ref 0.1–1.0)
Monocytes Relative: 10 %
Neutro Abs: 4.1 10*3/uL (ref 1.7–7.7)
Neutrophils Relative %: 61 %
Platelet Count: 213 10*3/uL (ref 150–400)
RBC: 4.66 MIL/uL (ref 4.22–5.81)
RDW: 12.8 % (ref 11.5–15.5)
WBC Count: 6.7 10*3/uL (ref 4.0–10.5)
nRBC: 0 % (ref 0.0–0.2)
nRBC: 0 /100{WBCs}

## 2023-10-26 LAB — CMP (CANCER CENTER ONLY)
ALT: 40 U/L (ref 0–44)
AST: 167 U/L — ABNORMAL HIGH (ref 15–41)
Albumin: 4.3 g/dL (ref 3.5–5.0)
Alkaline Phosphatase: 80 U/L (ref 38–126)
Anion gap: 12 (ref 5–15)
BUN: 8 mg/dL (ref 8–23)
CO2: 26 mmol/L (ref 22–32)
Calcium: 9.2 mg/dL (ref 8.9–10.3)
Chloride: 103 mmol/L (ref 98–111)
Creatinine: 0.94 mg/dL (ref 0.61–1.24)
GFR, Estimated: >60 mL/min (ref >60)
Glucose, Bld: 169 mg/dL — ABNORMAL HIGH (ref 70–99)
Potassium: 3.8 mmol/L (ref 3.5–5.1)
Sodium: 140 mmol/L (ref 135–145)
Total Bilirubin: 0.6 mg/dL (ref 0.0–1.2)
Total Protein: 6.7 g/dL (ref 6.5–8.1)

## 2023-10-26 LAB — TSH: TSH: 3.013 u[IU]/mL (ref 0.350–4.500)

## 2023-10-26 MED ORDER — HEPARIN SOD (PORK) LOCK FLUSH 100 UNIT/ML IV SOLN
500.0000 [IU] | Freq: Once | INTRAVENOUS | Status: DC | PRN
Start: 2023-10-26 — End: 2023-10-26

## 2023-10-26 MED ORDER — SODIUM CHLORIDE 0.9 % IV SOLN: INTRAVENOUS | Status: DC

## 2023-10-26 MED ORDER — SODIUM CHLORIDE 0.9 % IV SOLN
200.0000 mg | Freq: Once | INTRAVENOUS | Status: AC
  Administered 2023-10-26: 200 mg via INTRAVENOUS
  Filled 2023-10-26: qty 8

## 2023-10-26 MED ORDER — SODIUM CHLORIDE 0.9% FLUSH
10.0000 mL | INTRAVENOUS | Status: DC | PRN
Start: 2023-10-26 — End: 2023-10-26

## 2023-10-26 NOTE — Patient Instructions (Signed)

## 2023-10-26 NOTE — Telephone Encounter (Signed)
Patient has been scheduled for follow-up visit per 10/26/23 LOS.  Pt given an appt calendar with date and time.

## 2023-10-26 NOTE — Progress Notes (Signed)
Per verbal order of Dr Melvyn Neth, OK to treat with AST of 167

## 2023-10-27 LAB — T4: T4, Total: 8.2 ug/dL (ref 4.5–12.0)

## 2023-11-15 NOTE — Progress Notes (Signed)
 [facility]  [address] [place],  [place]  [address] [phone]  Clinic Day:  11/16/2023  Referring physician: Lonie Peak, PA-C   HISTORY OF PRESENT ILLNESS:  The patient is a 73 y.o. 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  He comes in today to be evaluated before heading into his 4th cycle of pembrolizumab.  The patient tolerated his 3rd cycle of pembrolizumab fairly well.  As it pertains to his disease, he denies having hematuria, increased urinary frequency, or other urinary symptoms which concern him for overt signs of disease progression. He claims to get occasional nausea upon eating; this usually occurs the first few days after receiving pembrolizumab.  This patient's bladder cancer history dates back to at least 2014.  Over time, he has had multiple bladder tumor resections.  He has also been exposed to BCG for his previously early-stage bladder cancers.  As his disease has returned and is now high-grade in nature, he is now on pembrolizumab for his disease management.  PHYSICAL EXAM:  Blood pressure 122/75, pulse 63, temperature 98 F (36.7 C), temperature source Oral, resp. rate 14, height 5' 10.9" (1.801 m), weight 161 lb 3.2 oz (73.1 kg), SpO2 98%. Wt Readings from Last 3 Encounters:  11/16/23 161 lb 3.2 oz (73.1 kg)  10/26/23 156 lb 11.2 oz (71.1 kg)  10/05/23 158 lb 1.6 oz (71.7 kg)   Body mass index is 22.55 kg/m. Performance status (ECOG): 1 - Symptomatic but completely ambulatory Physical Exam Constitutional:      Appearance: Normal appearance. He is not ill-appearing.  HENT:     Mouth/Throat:     Mouth: Mucous membranes are moist.     Pharynx: Oropharynx is clear. No oropharyngeal exudate or posterior oropharyngeal erythema.  Cardiovascular:     Rate and Rhythm: Normal rate and regular rhythm.     Heart sounds: No murmur heard.    No friction rub. No gallop.  Pulmonary:     Effort: Pulmonary effort is normal.  No respiratory distress.     Breath sounds: Normal breath sounds. No wheezing, rhonchi or rales.  Abdominal:     General: Bowel sounds are normal. There is no distension.     Palpations: Abdomen is soft. There is no mass.     Tenderness: There is no abdominal tenderness.  Musculoskeletal:        General: No swelling.     Right lower leg: No edema.     Left lower leg: No edema.  Lymphadenopathy:     Cervical: No cervical adenopathy.     Upper Body:     Right upper body: No supraclavicular or axillary adenopathy.     Left upper body: No supraclavicular or axillary adenopathy.     Lower Body: No right inguinal adenopathy. No left inguinal adenopathy.  Skin:    General: Skin is warm.     Coloration: Skin is not jaundiced.     Findings: No lesion or rash.  Neurological:     General: No focal deficit present.     Mental Status: He is alert and oriented to person, place, and time. Mental status is at baseline.  Psychiatric:        Mood and Affect: Mood normal.        Behavior: Behavior normal.        Thought Content: Thought content normal.     LABS:      Latest Ref Rng & Units 11/16/2023    9:30  AM 10/26/2023    9:19 AM 10/05/2023    8:42 AM  CBC  WBC 4.0 - 10.5 K/uL 7.0  6.7  7.7   Hemoglobin 13.0 - 17.0 g/dL 16.1  09.6  04.5   Hematocrit 39.0 - 52.0 % 42.9  44.8  45.6   Platelets 150 - 400 K/uL 215  213  196       Latest Ref Rng & Units 11/16/2023    9:30 AM 10/26/2023    9:19 AM 10/05/2023    8:42 AM  CMP  Glucose 70 - 99 mg/dL 409  811  914   BUN 8 - 23 mg/dL 13  8  12    Creatinine 0.61 - 1.24 mg/dL 7.82  9.56  2.13   Sodium 135 - 145 mmol/L 139  140  137   Potassium 3.5 - 5.1 mmol/L 4.0  3.8  3.8   Chloride 98 - 111 mmol/L 103  103  101   CO2 22 - 32 mmol/L 25  26  27    Calcium 8.9 - 10.3 mg/dL 9.2  9.2  9.3   Total Protein 6.5 - 8.1 g/dL 6.6  6.7  7.0   Total Bilirubin 0.0 - 1.2 mg/dL 0.5  0.6  0.4   Alkaline Phos 38 - 126 U/L 77  80  86   AST 15 - 41 U/L 24  167   18   ALT 0 - 44 U/L 18  40  10    ASSESSMENT & PLAN:  Assessment/Plan:  A 73 y.o. male with recurrent high-grade, but superficial, bladder cancer. He will proceed with his 4th cycle of pembrolizumab immunotherapy today.  Overall, the patient appears to be doing fairly well.  I will see him back in 3 weeks before he heads into his 5th cycle of pembrolizumab.  The patient understands all the plans discussed today and is in agreement with them.   Erik Alvarado Kirby Funk, MD

## 2023-11-16 ENCOUNTER — Inpatient Hospital Stay: Payer: Medicare Other | Attending: Oncology

## 2023-11-16 ENCOUNTER — Inpatient Hospital Stay (HOSPITAL_BASED_OUTPATIENT_CLINIC_OR_DEPARTMENT_OTHER): Payer: Medicare Other | Admitting: Oncology

## 2023-11-16 ENCOUNTER — Other Ambulatory Visit: Payer: Self-pay | Admitting: Oncology

## 2023-11-16 ENCOUNTER — Inpatient Hospital Stay: Payer: Medicare Other

## 2023-11-16 ENCOUNTER — Telehealth: Payer: Self-pay | Admitting: Oncology

## 2023-11-16 VITALS — BP 122/75 | HR 63 | Temp 98.0°F | Resp 14 | Ht 70.9 in | Wt 161.2 lb

## 2023-11-16 DIAGNOSIS — C672 Malignant neoplasm of lateral wall of bladder: Secondary | ICD-10-CM | POA: Diagnosis not present

## 2023-11-16 DIAGNOSIS — C679 Malignant neoplasm of bladder, unspecified: Secondary | ICD-10-CM | POA: Diagnosis not present

## 2023-11-16 DIAGNOSIS — Z5112 Encounter for antineoplastic immunotherapy: Secondary | ICD-10-CM | POA: Diagnosis present

## 2023-11-16 LAB — CBC WITH DIFFERENTIAL (CANCER CENTER ONLY)
Abs Immature Granulocytes: 0 10*3/uL (ref 0.00–0.07)
Basophils Absolute: 0.1 10*3/uL (ref 0.0–0.1)
Basophils Relative: 1 %
Eosinophils Absolute: 0.1 10*3/uL (ref 0.0–0.5)
Eosinophils Relative: 2 %
HCT: 42.9 % (ref 39.0–52.0)
Hemoglobin: 14.7 g/dL (ref 13.0–17.0)
Immature Granulocytes: 0 %
Lymphocytes Relative: 34 %
Lymphs Abs: 2.4 10*3/uL (ref 0.7–4.0)
MCH: 33 pg (ref 26.0–34.0)
MCHC: 34.3 g/dL (ref 30.0–36.0)
MCV: 96.2 fL (ref 80.0–100.0)
Monocytes Absolute: 0.6 10*3/uL (ref 0.1–1.0)
Monocytes Relative: 9 %
Neutro Abs: 3.8 10*3/uL (ref 1.7–7.7)
Neutrophils Relative %: 54 %
Platelet Count: 215 10*3/uL (ref 150–400)
RBC: 4.46 MIL/uL (ref 4.22–5.81)
RDW: 12.7 % (ref 11.5–15.5)
Smear Review: NORMAL
WBC Count: 7 10*3/uL (ref 4.0–10.5)
nRBC: 0 % (ref 0.0–0.2)
nRBC: 0 /100{WBCs}

## 2023-11-16 LAB — CMP (CANCER CENTER ONLY)
ALT: 18 U/L (ref 0–44)
AST: 24 U/L (ref 15–41)
Albumin: 4.2 g/dL (ref 3.5–5.0)
Alkaline Phosphatase: 77 U/L (ref 38–126)
Anion gap: 10 (ref 5–15)
BUN: 13 mg/dL (ref 8–23)
CO2: 25 mmol/L (ref 22–32)
Calcium: 9.2 mg/dL (ref 8.9–10.3)
Chloride: 103 mmol/L (ref 98–111)
Creatinine: 1.1 mg/dL (ref 0.61–1.24)
GFR, Estimated: >60 mL/min (ref >60)
Glucose, Bld: 115 mg/dL — ABNORMAL HIGH (ref 70–99)
Potassium: 4 mmol/L (ref 3.5–5.1)
Sodium: 139 mmol/L (ref 135–145)
Total Bilirubin: 0.5 mg/dL (ref 0.0–1.2)
Total Protein: 6.6 g/dL (ref 6.5–8.1)

## 2023-11-16 MED ORDER — SODIUM CHLORIDE 0.9 % IV SOLN
200.0000 mg | Freq: Once | INTRAVENOUS | Status: AC
Start: 2023-11-16 — End: 2023-11-16
  Administered 2023-11-16: 200 mg via INTRAVENOUS
  Filled 2023-11-16: qty 8

## 2023-11-16 MED ORDER — ONDANSETRON 4 MG PO TBDP: 4.0000 mg | ORAL_TABLET | Freq: Four times a day (QID) | ORAL | 1 refills | Status: AC | PRN

## 2023-11-16 MED ORDER — HEPARIN SOD (PORK) LOCK FLUSH 100 UNIT/ML IV SOLN
500.0000 [IU] | Freq: Once | INTRAVENOUS | Status: AC | PRN
  Administered 2023-11-16: 500 [IU]

## 2023-11-16 MED ORDER — SODIUM CHLORIDE 0.9% FLUSH
10.0000 mL | INTRAVENOUS | Status: DC | PRN
  Administered 2023-11-16: 10 mL

## 2023-11-16 MED ORDER — SODIUM CHLORIDE 0.9 % IV SOLN
INTRAVENOUS | Status: DC
Start: 2023-11-16 — End: 2023-11-16

## 2023-11-16 NOTE — Telephone Encounter (Signed)
 Patient has been scheduled for follow-up visit per 11/16/23 LOS.  Pt given an appt calendar with date and time.

## 2023-11-16 NOTE — Patient Instructions (Signed)
 Pembrolizumab Injection What is this medication? PEMBROLIZUMAB (PEM broe LIZ ue mab) treats some types of cancer. It works by helping your immune system slow or stop the spread of cancer cells. It is a monoclonal antibody. This medicine may be used for other purposes; ask your health care provider or pharmacist if you have questions. COMMON BRAND NAME(S): Keytruda What should I tell my care team before I take this medication? They need to know if you have any of these conditions: Allogeneic stem cell transplant (uses someone else's stem cells) Autoimmune diseases, such as Crohn disease, ulcerative colitis, lupus History of chest radiation Nervous system problems, such as Guillain-Barre syndrome, myasthenia gravis Organ transplant An unusual or allergic reaction to pembrolizumab, other medications, foods, dyes, or preservatives Pregnant or trying to get pregnant Breast-feeding How should I use this medication? This medication is injected into a vein. It is given by your care team in a hospital or clinic setting. A special MedGuide will be given to you before each treatment. Be sure to read this information carefully each time. Talk to your care team about the use of this medication in children. While it may be prescribed for children as young as 6 months for selected conditions, precautions do apply. Overdosage: If you think you have taken too much of this medicine contact a poison control center or emergency room at once. NOTE: This medicine is only for you. Do not share this medicine with others. What if I miss a dose? Keep appointments for follow-up doses. It is important not to miss your dose. Call your care team if you are unable to keep an appointment. What may interact with this medication? Interactions have not been studied. This list may not describe all possible interactions. Give your health care provider a list of all the medicines, herbs, non-prescription drugs, or dietary  supplements you use. Also tell them if you smoke, drink alcohol, or use illegal drugs. Some items may interact with your medicine. What should I watch for while using this medication? Your condition will be monitored carefully while you are receiving this medication. You may need blood work while taking this medication. This medication may cause serious skin reactions. They can happen weeks to months after starting the medication. Contact your care team right away if you notice fevers or flu-like symptoms with a rash. The rash may be red or purple and then turn into blisters or peeling of the skin. You may also notice a red rash with swelling of the face, lips, or lymph nodes in your neck or under your arms. Tell your care team right away if you have any change in your eyesight. Talk to your care team if you may be pregnant. Serious birth defects can occur if you take this medication during pregnancy and for 4 months after the last dose. You will need a negative pregnancy test before starting this medication. Contraception is recommended while taking this medication and for 4 months after the last dose. Your care team can help you find the option that works for you. Do not breastfeed while taking this medication and for 4 months after the last dose. What side effects may I notice from receiving this medication? Side effects that you should report to your care team as soon as possible: Allergic reactions--skin rash, itching, hives, swelling of the face, lips, tongue, or throat Dry cough, shortness of breath or trouble breathing Eye pain, redness, irritation, or discharge with blurry or decreased vision Heart muscle inflammation--unusual weakness or  fatigue, shortness of breath, chest pain, fast or irregular heartbeat, dizziness, swelling of the ankles, feet, or hands Hormone gland problems--headache, sensitivity to light, unusual weakness or fatigue, dizziness, fast or irregular heartbeat, increased  sensitivity to cold or heat, excessive sweating, constipation, hair loss, increased thirst or amount of urine, tremors or shaking, irritability Infusion reactions--chest pain, shortness of breath or trouble breathing, feeling faint or lightheaded Kidney injury (glomerulonephritis)--decrease in the amount of urine, red or dark brown urine, foamy or bubbly urine, swelling of the ankles, hands, or feet Liver injury--right upper belly pain, loss of appetite, nausea, light-colored stool, dark yellow or brown urine, yellowing skin or eyes, unusual weakness or fatigue Pain, tingling, or numbness in the hands or feet, muscle weakness, change in vision, confusion or trouble speaking, loss of balance or coordination, trouble walking, seizures Rash, fever, and swollen lymph nodes Redness, blistering, peeling, or loosening of the skin, including inside the mouth Sudden or severe stomach pain, bloody diarrhea, fever, nausea, vomiting Side effects that usually do not require medical attention (report to your care team if they continue or are bothersome): Bone, joint, or muscle pain Diarrhea Fatigue Loss of appetite Nausea Skin rash This list may not describe all possible side effects. Call your doctor for medical advice about side effects. You may report side effects to FDA at 1-800-FDA-1088. Where should I keep my medication? This medication is given in a hospital or clinic. It will not be stored at home. NOTE: This sheet is a summary. It may not cover all possible information. If you have questions about this medicine, talk to your doctor, pharmacist, or health care provider.  2024 Elsevier/Gold Standard (2022-01-10 00:00:00)  [facility] [place] [facility]  Discharge Instructions: Thank you for choosing [facility] to provide your oncology and hematology care.  If you have a lab appointment with the Cancer Center, please go directly to the Cancer Center  and check in at the registration area.   Wear comfortable clothing and clothing appropriate for easy access to any Portacath or PICC line.   We strive to give you quality time with your provider. You may need to reschedule your appointment if you arrive late (15 or more minutes).  Arriving late affects you and other patients whose appointments are after yours.  Also, if you miss three or more appointments without notifying the office, you may be dismissed from the clinic at the provider's discretion.      For prescription refill requests, have your pharmacy contact our office and allow 72 hours for refills to be completed.    Today you received the following chemotherapy and/or immunotherapy agents PEMBROLIZUMAB      To help prevent nausea and vomiting after your treatment, we encourage you to take your nausea medication as directed.  BELOW ARE SYMPTOMS THAT SHOULD BE REPORTED IMMEDIATELY: *FEVER GREATER THAN 100.4 F (38 C) OR HIGHER *CHILLS OR SWEATING *NAUSEA AND VOMITING THAT IS NOT CONTROLLED WITH YOUR NAUSEA MEDICATION *UNUSUAL SHORTNESS OF BREATH *UNUSUAL BRUISING OR BLEEDING *URINARY PROBLEMS (pain or burning when urinating, or frequent urination) *BOWEL PROBLEMS (unusual diarrhea, constipation, pain near the anus) TENDERNESS IN MOUTH AND THROAT WITH OR WITHOUT PRESENCE OF ULCERS (sore throat, sores in mouth, or a toothache) UNUSUAL RASH, SWELLING OR PAIN  UNUSUAL VAGINAL DISCHARGE OR ITCHING   Items with * indicate a potential emergency and should be followed up as soon as possible or go to the Emergency Department  if any problems should occur.  Please show the CHEMOTHERAPY ALERT CARD or IMMUNOTHERAPY ALERT CARD at check-in to the Emergency Department and triage nurse.  Should you have questions after your visit or need to cancel or reschedule your appointment, please contact [facility] [place] [facility]  Dept: [phone]  and follow the  prompts.  Office hours are 8:00 a.m. to 4:30 p.m. Monday - Friday. Please note that voicemails left after 4:00 p.m. may not be returned until the following business day.  We are closed weekends and major holidays. You have access to a nurse at all times for urgent questions. Please call the main number to the clinic Dept: [phone] and follow the prompts.  For any non-urgent questions, you may also contact your provider using MyChart. We now offer e-Visits for anyone 78 and older to request care online for non-urgent symptoms. For details visit [email or web address].   Also download the MyChart app! Go to the app store, search "MyChart", open the app, select [facility], and log in with your MyChart username and password.

## 2023-11-17 ENCOUNTER — Other Ambulatory Visit: Payer: Self-pay

## 2023-11-20 ENCOUNTER — Inpatient Hospital Stay: Payer: Medicare Other | Admitting: Dietician

## 2023-11-20 NOTE — Progress Notes (Signed)
 Nutrition Follow Up:  Called at home/cell telephone to assess PO intake and tolerance after treatments.  He reports he can't keep any solid foods down.  He is only able to drink liquids but when he tries to eat anything solid or soft and mushy (oatmeal, blended black beans) he vomits.  He states he is using the Zofran just prescribed but it hasn't helped.  He said this usually last for 4-5 days then he can eat whatever he wants.  He has not soy milk in home now or any ONS and no $ to but anything until 11/24/23.  He didn't ask for a food bag at cancer center to aid with his limited food budget. He is still trying to drink black coffee, water, and beef bouillon and liquid IV with some sprite to stay hydrated.   Labs: reviewed 11/16/23   Medications: Zofran every 6 hours, MVI and Vit D  Anthropometrics:  rebounded past month Height: 70" Weight: 11/16/23  161.3#  10/05/23  158 09/14/23  161#  BMI: 22.55   Estimated Energy Needs  Kcals: 2200-2500 Protein: 88-110 Fluid: 2.5 L   NUTRITION DIAGNOSIS: inadequate intake r/t to vomiting.  Continues, unable to tolerate any solid foods or pudding like consistency without vomiting after most recent treatment, concerns with food insecurity also continue.  INTERVENTION:   Encourage use of blended foods to thin liquid consistency to trial drinking.  Encouraged him to reach out to RN/MD for alternatives to help manage nausea. Encouraged his to ask or a bag from the food pantry at cancer center at next visit. Encouraged planned use of generic ONS (equate plus) for next round after he has$ for food on 3/15 and to plan to have lactose free protein drinks on hand for next round.  MONITORING, EVALUATION, GOAL: weight trends, nutrition impact symptoms, PO intake, labs   Next Visit: remote next week  Gennaro Africa, RDN, LDN Registered Dietitian, [facility] Part Time Remote (Usual office hours: Tuesday-Thursday) Mobile: [phone]

## 2023-11-25 ENCOUNTER — Other Ambulatory Visit: Payer: Self-pay

## 2023-11-28 ENCOUNTER — Encounter: Admitting: Dietician

## 2023-11-28 ENCOUNTER — Telehealth: Payer: Self-pay | Admitting: Dietician

## 2023-11-28 NOTE — Telephone Encounter (Signed)
Attempted to reach patient for a scheduled remote nutrition consult. Provided my cell# on voice mail and in a text to return call for his follow up nutrition consult.  Gennaro Africa, RDN, LDN Registered Dietitian, [facility] Part Time Remote (Usual office hours: Tuesday-Thursday) Cell: [phone]

## 2023-12-07 ENCOUNTER — Inpatient Hospital Stay (HOSPITAL_BASED_OUTPATIENT_CLINIC_OR_DEPARTMENT_OTHER): Admitting: Oncology

## 2023-12-07 ENCOUNTER — Inpatient Hospital Stay

## 2023-12-07 VITALS — BP 136/58 | HR 65 | Temp 97.9°F | Resp 16 | Ht 70.9 in | Wt 162.4 lb

## 2023-12-07 DIAGNOSIS — C672 Malignant neoplasm of lateral wall of bladder: Secondary | ICD-10-CM | POA: Diagnosis not present

## 2023-12-07 DIAGNOSIS — C679 Malignant neoplasm of bladder, unspecified: Secondary | ICD-10-CM

## 2023-12-07 DIAGNOSIS — Z5112 Encounter for antineoplastic immunotherapy: Secondary | ICD-10-CM | POA: Diagnosis not present

## 2023-12-07 LAB — CBC WITH DIFFERENTIAL (CANCER CENTER ONLY)
Abs Immature Granulocytes: 0.01 10*3/uL (ref 0.00–0.07)
Basophils Absolute: 0.1 10*3/uL (ref 0.0–0.1)
Basophils Relative: 1 %
Eosinophils Absolute: 0.3 10*3/uL (ref 0.0–0.5)
Eosinophils Relative: 4 %
HCT: 42.3 % (ref 39.0–52.0)
Hemoglobin: 14.4 g/dL (ref 13.0–17.0)
Immature Granulocytes: 0 %
Lymphocytes Relative: 22 %
Lymphs Abs: 1.3 10*3/uL (ref 0.7–4.0)
MCH: 33.2 pg (ref 26.0–34.0)
MCHC: 34 g/dL (ref 30.0–36.0)
MCV: 97.5 fL (ref 80.0–100.0)
Monocytes Absolute: 0.7 10*3/uL (ref 0.1–1.0)
Monocytes Relative: 12 %
Neutro Abs: 3.7 10*3/uL (ref 1.7–7.7)
Neutrophils Relative %: 61 %
Platelet Count: 199 10*3/uL (ref 150–400)
RBC: 4.34 MIL/uL (ref 4.22–5.81)
RDW: 13.2 % (ref 11.5–15.5)
WBC Count: 6 10*3/uL (ref 4.0–10.5)
nRBC: 0 % (ref 0.0–0.2)
nRBC: 0 /100{WBCs}

## 2023-12-07 LAB — CMP (CANCER CENTER ONLY)
ALT: 22 U/L (ref 0–44)
AST: 31 U/L (ref 15–41)
Albumin: 4.2 g/dL (ref 3.5–5.0)
Alkaline Phosphatase: 81 U/L (ref 38–126)
Anion gap: 8 (ref 5–15)
BUN: 10 mg/dL (ref 8–23)
CO2: 26 mmol/L (ref 22–32)
Calcium: 9 mg/dL (ref 8.9–10.3)
Chloride: 104 mmol/L (ref 98–111)
Creatinine: 0.97 mg/dL (ref 0.61–1.24)
GFR, Estimated: >60 mL/min (ref >60)
Glucose, Bld: 127 mg/dL — ABNORMAL HIGH (ref 70–99)
Potassium: 3.8 mmol/L (ref 3.5–5.1)
Sodium: 138 mmol/L (ref 135–145)
Total Bilirubin: 0.5 mg/dL (ref 0.0–1.2)
Total Protein: 6.6 g/dL (ref 6.5–8.1)

## 2023-12-07 MED ORDER — SODIUM CHLORIDE 0.9% FLUSH
10.0000 mL | INTRAVENOUS | Status: DC | PRN
  Administered 2023-12-07: 10 mL

## 2023-12-07 MED ORDER — HEPARIN SOD (PORK) LOCK FLUSH 100 UNIT/ML IV SOLN
500.0000 [IU] | Freq: Once | INTRAVENOUS | Status: AC | PRN
  Administered 2023-12-07: 500 [IU]

## 2023-12-07 MED ORDER — SODIUM CHLORIDE 0.9 % IV SOLN: INTRAVENOUS | Status: DC

## 2023-12-07 MED ORDER — SODIUM CHLORIDE 0.9 % IV SOLN
200.0000 mg | Freq: Once | INTRAVENOUS | Status: AC
  Administered 2023-12-07: 200 mg via INTRAVENOUS
  Filled 2023-12-07: qty 8

## 2023-12-07 NOTE — Patient Instructions (Signed)

## 2023-12-07 NOTE — Progress Notes (Signed)
 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  He comes in today to be evaluated before heading into his 5th cycle of pembrolizumab.  The patient tolerated his 4th cycle of pembrolizumab fairly well.  As it pertains to his disease, he denies having hematuria, increased urinary frequency, or other urinary symptoms which concern him for overt signs of disease progression. He claims to have nausea upon eating; this usually occurs the first few days after receiving each cycle of pembrolizumab. Afterwards, this symptom dissipates.  This patient's bladder cancer history dates back to at least 2014.  Over time, he has had multiple bladder tumor resections.  He has also been exposed to BCG for his previously early-stage bladder cancers.  As his disease has returned and is now high-grade in nature, he is now on pembrolizumab for his disease management.  PHYSICAL EXAM:  Blood pressure (!) 136/58, pulse 65, temperature 97.9 F (36.6 C), temperature source Oral, resp. rate 16, height 5' 10.9" (1.801 m), weight 162 lb 6.4 oz (73.7 kg), SpO2 98%. Wt Readings from Last 3 Encounters:  12/07/23 162 lb 6.4 oz (73.7 kg)  11/16/23 161 lb 3.2 oz (73.1 kg)  10/26/23 156 lb 11.2 oz (71.1 kg)   Body mass index is 22.71 kg/m. Performance status (ECOG): 1 - Symptomatic but completely ambulatory Physical Exam Constitutional:      Appearance: Normal appearance. He is not ill-appearing.  HENT:     Mouth/Throat:     Mouth: Mucous membranes are moist.     Pharynx: Oropharynx is clear. No oropharyngeal exudate or posterior oropharyngeal erythema.  Cardiovascular:     Rate and Rhythm: Normal rate and regular rhythm.     Heart sounds: No murmur heard.    No friction rub. No gallop.   Pulmonary:     Effort: Pulmonary effort is normal. No respiratory distress.     Breath sounds: Normal breath sounds. No wheezing, rhonchi or rales.  Abdominal:     General: Bowel sounds are normal. There is no distension.     Palpations: Abdomen is soft. There is no mass.     Tenderness: There is no abdominal tenderness.  Musculoskeletal:        General: No swelling.     Right lower leg: No edema.     Left lower leg: No edema.  Lymphadenopathy:     Cervical: No cervical adenopathy.     Upper Body:     Right upper body: No supraclavicular or axillary adenopathy.     Left upper body: No supraclavicular or axillary adenopathy.     Lower Body: No right inguinal adenopathy. No left inguinal adenopathy.  Skin:    General: Skin is warm.     Coloration: Skin is not jaundiced.     Findings: No lesion or rash.  Neurological:     General: No focal deficit present.     Mental Status: He is alert and oriented to person, place, and time. Mental status is at baseline.  Psychiatric:        Mood and Affect: Mood normal.        Behavior: Behavior normal.        Thought Content: Thought content normal.    LABS:      Latest Ref Rng &  Units 12/07/2023    9:12 AM 11/16/2023    9:30 AM 10/26/2023    9:19 AM  CBC  WBC 4.0 - 10.5 K/uL 6.0  7.0  6.7   Hemoglobin 13.0 - 17.0 g/dL 86.5  78.4  69.6   Hematocrit 39.0 - 52.0 % 42.3  42.9  44.8   Platelets 150 - 400 K/uL 199  215  213       Latest Ref Rng & Units 12/07/2023    9:12 AM 11/16/2023    9:30 AM 10/26/2023    9:19 AM  CMP  Glucose 70 - 99 mg/dL 295  284  132   BUN 8 - 23 mg/dL 10  13  8    Creatinine 0.61 - 1.24 mg/dL 4.40  1.02  7.25   Sodium 135 - 145 mmol/L 138  139  140   Potassium 3.5 - 5.1 mmol/L 3.8  4.0  3.8   Chloride 98 - 111 mmol/L 104  103  103   CO2 22 - 32 mmol/L 26  25  26    Calcium 8.9 - 10.3 mg/dL 9.0  9.2  9.2   Total Protein 6.5 - 8.1 g/dL 6.6  6.6  6.7   Total Bilirubin 0.0 - 1.2 mg/dL 0.5  0.5  0.6   Alkaline Phos  38 - 126 U/L 81  77  80   AST 15 - 41 U/L 31  24  167   ALT 0 - 44 U/L 22  18  40    ASSESSMENT & PLAN:  Assessment/Plan:  A 73 y.o. male with recurrent high-grade, but superficial, bladder cancer. He will proceed with his 5th cycle of pembrolizumab immunotherapy today.  With respect to his nausea, I encouraged the patient to take his oral antiemetics approximately 30-45 minutes before he eats; his family members with him today say he does not do this consistently.  Overall, the patient appears to be doing fairly well.  I will see him back in 3 weeks before he heads into his 6th cycle of pembrolizumab.  The patient understands all the plans discussed today and is in agreement with them.   Mekesha Solomon Kirby Funk, MD

## 2023-12-08 ENCOUNTER — Other Ambulatory Visit: Payer: Self-pay

## 2023-12-14 ENCOUNTER — Other Ambulatory Visit: Payer: Self-pay

## 2023-12-15 ENCOUNTER — Encounter: Payer: Self-pay | Admitting: Oncology

## 2023-12-18 ENCOUNTER — Other Ambulatory Visit: Payer: Self-pay

## 2023-12-20 ENCOUNTER — Encounter: Payer: Self-pay | Admitting: Oncology

## 2023-12-28 ENCOUNTER — Encounter: Payer: Self-pay | Admitting: Hematology and Oncology

## 2023-12-28 ENCOUNTER — Inpatient Hospital Stay (HOSPITAL_BASED_OUTPATIENT_CLINIC_OR_DEPARTMENT_OTHER): Admitting: Hematology and Oncology

## 2023-12-28 ENCOUNTER — Inpatient Hospital Stay

## 2023-12-28 ENCOUNTER — Other Ambulatory Visit

## 2023-12-28 ENCOUNTER — Inpatient Hospital Stay: Attending: Oncology

## 2023-12-28 ENCOUNTER — Ambulatory Visit

## 2023-12-28 ENCOUNTER — Ambulatory Visit: Admitting: Oncology

## 2023-12-28 VITALS — BP 106/81 | HR 58 | Temp 98.3°F | Resp 18 | Ht 70.9 in | Wt 161.6 lb

## 2023-12-28 DIAGNOSIS — Z5112 Encounter for antineoplastic immunotherapy: Secondary | ICD-10-CM | POA: Diagnosis not present

## 2023-12-28 DIAGNOSIS — Z79620 Long term (current) use of immunosuppressive biologic: Secondary | ICD-10-CM | POA: Diagnosis not present

## 2023-12-28 DIAGNOSIS — C672 Malignant neoplasm of lateral wall of bladder: Secondary | ICD-10-CM

## 2023-12-28 DIAGNOSIS — R21 Rash and other nonspecific skin eruption: Secondary | ICD-10-CM | POA: Diagnosis not present

## 2023-12-28 DIAGNOSIS — C679 Malignant neoplasm of bladder, unspecified: Secondary | ICD-10-CM

## 2023-12-28 LAB — CMP (CANCER CENTER ONLY)
ALT: 11 U/L (ref 0–44)
AST: 17 U/L (ref 15–41)
Albumin: 4.1 g/dL (ref 3.5–5.0)
Alkaline Phosphatase: 87 U/L (ref 38–126)
Anion gap: 8 (ref 5–15)
BUN: 15 mg/dL (ref 8–23)
CO2: 26 mmol/L (ref 22–32)
Calcium: 9.2 mg/dL (ref 8.9–10.3)
Chloride: 104 mmol/L (ref 98–111)
Creatinine: 0.99 mg/dL (ref 0.61–1.24)
GFR, Estimated: >60 mL/min (ref >60)
Glucose, Bld: 110 mg/dL — ABNORMAL HIGH (ref 70–99)
Potassium: 3.7 mmol/L (ref 3.5–5.1)
Sodium: 139 mmol/L (ref 135–145)
Total Bilirubin: 0.3 mg/dL (ref 0.0–1.2)
Total Protein: 6.7 g/dL (ref 6.5–8.1)

## 2023-12-28 LAB — CBC WITH DIFFERENTIAL (CANCER CENTER ONLY)
Abs Immature Granulocytes: 0.01 10*3/uL (ref 0.00–0.07)
Basophils Absolute: 0.1 10*3/uL (ref 0.0–0.1)
Basophils Relative: 1 %
Eosinophils Absolute: 0.4 10*3/uL (ref 0.0–0.5)
Eosinophils Relative: 5 %
HCT: 44.7 % (ref 39.0–52.0)
Hemoglobin: 14.8 g/dL (ref 13.0–17.0)
Immature Granulocytes: 0 %
Lymphocytes Relative: 26 %
Lymphs Abs: 1.9 10*3/uL (ref 0.7–4.0)
MCH: 32.4 pg (ref 26.0–34.0)
MCHC: 33.1 g/dL (ref 30.0–36.0)
MCV: 97.8 fL (ref 80.0–100.0)
Monocytes Absolute: 0.8 10*3/uL (ref 0.1–1.0)
Monocytes Relative: 11 %
Neutro Abs: 4.1 10*3/uL (ref 1.7–7.7)
Neutrophils Relative %: 57 %
Platelet Count: 211 10*3/uL (ref 150–400)
RBC: 4.57 MIL/uL (ref 4.22–5.81)
RDW: 12.8 % (ref 11.5–15.5)
WBC Count: 7.2 10*3/uL (ref 4.0–10.5)
nRBC: 0 % (ref 0.0–0.2)
nRBC: 0 /100{WBCs}

## 2023-12-28 LAB — TSH: TSH: 2.481 u[IU]/mL (ref 0.350–4.500)

## 2023-12-28 MED ORDER — SODIUM CHLORIDE 0.9 % IV SOLN: INTRAVENOUS | Status: DC

## 2023-12-28 MED ORDER — HEPARIN SOD (PORK) LOCK FLUSH 100 UNIT/ML IV SOLN
500.0000 [IU] | Freq: Once | INTRAVENOUS | Status: AC | PRN
  Administered 2023-12-28: 500 [IU]

## 2023-12-28 MED ORDER — SODIUM CHLORIDE 0.9% FLUSH
10.0000 mL | INTRAVENOUS | Status: DC | PRN
  Administered 2023-12-28: 10 mL

## 2023-12-28 MED ORDER — SODIUM CHLORIDE 0.9 % IV SOLN
200.0000 mg | Freq: Once | INTRAVENOUS | Status: AC
  Administered 2023-12-28: 200 mg via INTRAVENOUS
  Filled 2023-12-28: qty 8

## 2023-12-28 MED ORDER — TRIAMCINOLONE ACETONIDE 0.025 % EX OINT: 1.0000 | TOPICAL_OINTMENT | Freq: Two times a day (BID) | CUTANEOUS | 0 refills | Status: DC

## 2023-12-28 NOTE — Progress Notes (Cosign Needed)
 [facility]  [address] [place],  [place]  [address] [phone]  Clinic Day:  12/28/2023  Referring physician: Montey Lot, PA-C   HISTORY OF PRESENT ILLNESS:  The patient is a 73 y.o. 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  He comes in today to be evaluated before heading into his 6th cycle of pembrolizumab .  The patient tolerated his 5th cycle of pembrolizumab  fairly well.  He reports generalized itching, with occasional rash.  He is using topical Aquaphor.  As it pertains to his disease, he denies having hematuria, increased urinary frequency, or other urinary symptoms which concern him for overt signs of disease progression.   This patient's bladder cancer history dates back to at least 2014.  Over time, he has had multiple bladder tumor resections.  He has also been exposed to BCG for his previously early-stage bladder cancers.  As his disease has returned and is now high-grade in nature, he is now on pembrolizumab  for his disease management.   PHYSICAL EXAM:  Blood pressure 106/81, pulse (!) 58, temperature 98.3 F (36.8 C), temperature source Oral, resp. rate 18, height 5' 10.9 (1.801 m), weight 161 lb 9.6 oz (73.3 kg), SpO2 95%. Wt Readings from Last 3 Encounters:  12/28/23 161 lb 9.6 oz (73.3 kg)  12/07/23 162 lb 6.4 oz (73.7 kg)  11/16/23 161 lb 3.2 oz (73.1 kg)   Body mass index is 22.6 kg/m.  Performance status (ECOG): 0 - Asymptomatic  Physical Exam Skin:    Comments: Raised rash right lateral distal leg   LABS:      Latest Ref Rng & Units 12/28/2023    1:43 PM 12/07/2023    9:12 AM 11/16/2023    9:30 AM  CBC  WBC 4.0 - 10.5 K/uL 7.2  6.0  7.0   Hemoglobin 13.0 - 17.0 g/dL 85.1  85.5  85.2   Hematocrit 39.0 - 52.0 % 44.7  42.3  42.9   Platelets 150 - 400 K/uL 211  199  215       Latest Ref Rng & Units 12/28/2023    1:43 PM 12/07/2023    9:12 AM 11/16/2023    9:30 AM  CMP  Glucose 70 - 99 mg/dL 889  872  884   BUN 8 - 23  mg/dL 15  10  13    Creatinine 0.61 - 1.24 mg/dL 9.00  9.02  8.89   Sodium 135 - 145 mmol/L 139  138  139   Potassium 3.5 - 5.1 mmol/L 3.7  3.8  4.0   Chloride 98 - 111 mmol/L 104  104  103   CO2 22 - 32 mmol/L 26  26  25    Calcium  8.9 - 10.3 mg/dL 9.2  9.0  9.2   Total Protein 6.5 - 8.1 g/dL 6.7  6.6  6.6   Total Bilirubin 0.0 - 1.2 mg/dL 0.3  0.5  0.5   Alkaline Phos 38 - 126 U/L 87  81  77   AST 15 - 41 U/L 17  31  24    ALT 0 - 44 U/L 11  22  18       No results found for: CEA1, CEA / No results found for: CEA1, CEA Lab Results  Component Value Date   PSA1 7.2 (H) 01/01/2023       Component Value Date/Time   PSA1 7.2 (H) 01/01/2023 0956    Review Flowsheet       Latest Ref Rng & Units 01/01/2023  Oncology Labs  Prostate Specific Ag, Serum 0.0 - 4.0 ng/mL 7.2      STUDIES:  No results found.    ASSESSMENT & PLAN:   Assessment/Plan:  73 y.o. male with recurrent high-grade, but superficial, bladder cancer. He will proceed with his 6th cycle of pembrolizumab  immunotherapy today.  With respect to his itching and rash, I will give him triamcinolone  cream to use on the rash and highly pruritic areas.  I advised him to use Benadryl  for generalized itching. I advised him to avoid hot showers or bath and to apply a moisturizer after bathing. Overall, the patient appears to be doing fairly well.  I will see him back in 3 weeks before he heads into his 7th cycle of pembrolizumab .  The patient understands all the plans discussed today and is in agreement with them.  He knows to contact our office if he develops concerns prior to his next appointment.     Andrez DELENA Foy, PA-C   Physician Assistant [facility] [phone]

## 2023-12-28 NOTE — Patient Instructions (Signed)
 [facility] [place] [facility]  Discharge Instructions: Thank you for choosing [facility] to provide your oncology and hematology care.  If you have a lab appointment with the Cancer Center, please go directly to the Cancer Center and check in at the registration area.   Wear comfortable clothing and clothing appropriate for easy access to any Portacath or PICC line.   We strive to give you quality time with your provider. You may need to reschedule your appointment if you arrive late (15 or more minutes).  Arriving late affects you and other patients whose appointments are after yours.  Also, if you miss three or more appointments without notifying the office, you may be dismissed from the clinic at the provider's discretion.      For prescription refill requests, have your pharmacy contact our office and allow 72 hours for refills to be completed.    Today you received the following chemotherapy and/or immunotherapy agents Erik Alvarado       To help prevent nausea and vomiting after your treatment, we encourage you to take your nausea medication as directed.  BELOW ARE SYMPTOMS THAT SHOULD BE REPORTED IMMEDIATELY: *FEVER GREATER THAN 100.4 F (38 C) OR HIGHER *CHILLS OR SWEATING *NAUSEA AND VOMITING THAT IS NOT CONTROLLED WITH YOUR NAUSEA MEDICATION *UNUSUAL SHORTNESS OF BREATH *UNUSUAL BRUISING OR BLEEDING *URINARY PROBLEMS (pain or burning when urinating, or frequent urination) *BOWEL PROBLEMS (unusual diarrhea, constipation, pain near the anus) TENDERNESS IN MOUTH AND THROAT WITH OR WITHOUT PRESENCE OF ULCERS (sore throat, sores in mouth, or a toothache) UNUSUAL RASH, SWELLING OR PAIN  UNUSUAL VAGINAL DISCHARGE OR ITCHING   Items with * indicate a potential emergency and should be followed up as soon as possible or go to the Emergency Department if any problems should occur.  Please show the CHEMOTHERAPY ALERT CARD or IMMUNOTHERAPY ALERT  CARD at check-in to the Emergency Department and triage nurse.  Should you have questions after your visit or need to cancel or reschedule your appointment, please contact [facility] [place] [facility]  Dept: [phone]  and follow the prompts.  Office hours are 8:00 a.m. to 4:30 p.m. Monday - Friday. Please note that voicemails left after 4:00 p.m. may not be returned until the following business day.  We are closed weekends and major holidays. You have access to a nurse at all times for urgent questions. Please call the main number to the clinic Dept: [phone] and follow the prompts.  For any non-urgent questions, you may also contact your provider using MyChart. We now offer e-Visits for anyone 18 and older to request care online for non-urgent symptoms. For details visit [email or web address].   Also download the MyChart app! Go to the app store, search "MyChart", open the app, select [place], and log in with your MyChart username and password.

## 2023-12-29 LAB — T4: T4, Total: 7 ug/dL (ref 4.5–12.0)

## 2023-12-31 ENCOUNTER — Encounter: Payer: Self-pay | Admitting: Oncology

## 2024-01-17 NOTE — Progress Notes (Signed)
 [facility]  [address] [place],  [place]  [address] [phone]  Clinic Day:  01/18/2024  Referring physician: [place] Hammans, MD   HISTORY OF PRESENT ILLNESS:  The patient is a 73 y.o. 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  He comes in today to be evaluated before heading into his 7th cycle of pembrolizumab .  The patient tolerated his 6th cycle of pembrolizumab  fairly well.  As it pertains to his disease, he denies having hematuria, increased urinary frequency, or other urinary symptoms which concern him for overt signs of disease progression.  This patient's bladder cancer history dates back to at least 2014.  Over time, he has had multiple bladder tumor resections.  He has also been exposed to BCG for his previously early-stage bladder cancers.  As his disease has returned and is now high-grade in nature, he is now on pembrolizumab  for his disease management.  PHYSICAL EXAM:  There were no vitals taken for this visit. Wt Readings from Last 3 Encounters:  12/28/23 161 lb 9.6 oz (73.3 kg)  12/07/23 162 lb 6.4 oz (73.7 kg)  11/16/23 161 lb 3.2 oz (73.1 kg)   There is no height or weight on file to calculate BMI. Performance status (ECOG): 1 - Symptomatic but completely ambulatory Physical Exam Constitutional:      Appearance: Normal appearance. He is not ill-appearing.  HENT:     Mouth/Throat:     Mouth: Mucous membranes are moist.     Pharynx: Oropharynx is clear. No oropharyngeal exudate or posterior oropharyngeal erythema.  Cardiovascular:     Rate and Rhythm: Normal rate and regular rhythm.     Heart sounds: No murmur heard.    No friction rub. No gallop.  Pulmonary:     Effort: Pulmonary effort is normal. No respiratory distress.     Breath sounds: Normal breath sounds. No wheezing, rhonchi or rales.  Abdominal:     General: Bowel sounds are normal. There is no distension.     Palpations: Abdomen is soft. There is no  mass.     Tenderness: There is no abdominal tenderness.  Musculoskeletal:        General: No swelling.     Right lower leg: No edema.     Left lower leg: No edema.  Lymphadenopathy:     Cervical: No cervical adenopathy.     Upper Body:     Right upper body: No supraclavicular or axillary adenopathy.     Left upper body: No supraclavicular or axillary adenopathy.     Lower Body: No right inguinal adenopathy. No left inguinal adenopathy.  Skin:    General: Skin is warm.     Coloration: Skin is not jaundiced.     Findings: No lesion or rash.  Neurological:     General: No focal deficit present.     Mental Status: He is alert and oriented to person, place, and time. Mental status is at baseline.  Psychiatric:        Mood and Affect: Mood normal.        Behavior: Behavior normal.        Thought Content: Thought content normal.    LABS:      Latest Ref Rng & Units 01/18/2024    2:35 PM 12/28/2023    1:43 PM 12/07/2023    9:12 AM  CBC  WBC 4.0 - 10.5 K/uL 8.1  7.2  6.0   Hemoglobin 13.0 - 17.0 g/dL 15.0  14.8  14.4   Hematocrit 39.0 - 52.0 % 45.4  44.7  42.3   Platelets 150 - 400 K/uL 205  211  199       Latest Ref Rng & Units 01/18/2024    2:35 PM 12/28/2023    1:43 PM 12/07/2023    9:12 AM  CMP  Glucose 70 - 99 mg/dL 469  629  528   BUN 8 - 23 mg/dL 12  15  10    Creatinine 0.61 - 1.24 mg/dL 4.13  2.44  0.10   Sodium 135 - 145 mmol/L 141  139  138   Potassium 3.5 - 5.1 mmol/L 3.9  3.7  3.8   Chloride 98 - 111 mmol/L 104  104  104   CO2 22 - 32 mmol/L 25  26  26    Calcium  8.9 - 10.3 mg/dL 9.6  9.2  9.0   Total Protein 6.5 - 8.1 g/dL 6.6  6.7  6.6   Total Bilirubin 0.0 - 1.2 mg/dL 0.3  0.3  0.5   Alkaline Phos 38 - 126 U/L 77  87  81   AST 15 - 41 U/L 21  17  31    ALT 0 - 44 U/L 17  11  22     ASSESSMENT & PLAN:  Assessment/Plan:  A 73 y.o. male with recurrent high-grade, but superficial, bladder cancer. He will proceed with his 7th cycle of pembrolizumab  immunotherapy  today.  Overall, the patient appears to be doing fairly well.  I will see him back in 3 weeks before he heads into his 8th cycle of pembrolizumab .  The patient understands all the plans discussed today and is in agreement with them.   Pura Picinich Felicia Horde, MD

## 2024-01-18 ENCOUNTER — Inpatient Hospital Stay: Attending: Oncology

## 2024-01-18 ENCOUNTER — Inpatient Hospital Stay (HOSPITAL_BASED_OUTPATIENT_CLINIC_OR_DEPARTMENT_OTHER): Attending: Oncology | Admitting: Oncology

## 2024-01-18 ENCOUNTER — Encounter: Payer: Self-pay | Admitting: Oncology

## 2024-01-18 ENCOUNTER — Other Ambulatory Visit: Payer: Self-pay

## 2024-01-18 ENCOUNTER — Other Ambulatory Visit

## 2024-01-18 ENCOUNTER — Other Ambulatory Visit: Payer: Self-pay | Admitting: Oncology

## 2024-01-18 ENCOUNTER — Ambulatory Visit

## 2024-01-18 ENCOUNTER — Inpatient Hospital Stay

## 2024-01-18 ENCOUNTER — Ambulatory Visit: Admitting: Oncology

## 2024-01-18 DIAGNOSIS — H532 Diplopia: Secondary | ICD-10-CM | POA: Diagnosis not present

## 2024-01-18 DIAGNOSIS — Z5112 Encounter for antineoplastic immunotherapy: Secondary | ICD-10-CM | POA: Insufficient documentation

## 2024-01-18 DIAGNOSIS — E876 Hypokalemia: Secondary | ICD-10-CM | POA: Diagnosis not present

## 2024-01-18 DIAGNOSIS — R109 Unspecified abdominal pain: Secondary | ICD-10-CM | POA: Insufficient documentation

## 2024-01-18 DIAGNOSIS — C679 Malignant neoplasm of bladder, unspecified: Secondary | ICD-10-CM | POA: Insufficient documentation

## 2024-01-18 DIAGNOSIS — Z79899 Other long term (current) drug therapy: Secondary | ICD-10-CM | POA: Insufficient documentation

## 2024-01-18 DIAGNOSIS — C678 Malignant neoplasm of overlapping sites of bladder: Secondary | ICD-10-CM | POA: Diagnosis not present

## 2024-01-18 DIAGNOSIS — N400 Enlarged prostate without lower urinary tract symptoms: Secondary | ICD-10-CM

## 2024-01-18 DIAGNOSIS — Z79620 Long term (current) use of immunosuppressive biologic: Secondary | ICD-10-CM | POA: Diagnosis not present

## 2024-01-18 LAB — CBC WITH DIFFERENTIAL (CANCER CENTER ONLY)
Abs Immature Granulocytes: 0.02 10*3/uL (ref 0.00–0.07)
Basophils Absolute: 0.1 10*3/uL (ref 0.0–0.1)
Basophils Relative: 1 %
Eosinophils Absolute: 0.2 10*3/uL (ref 0.0–0.5)
Eosinophils Relative: 2 %
HCT: 45.4 % (ref 39.0–52.0)
Hemoglobin: 15 g/dL (ref 13.0–17.0)
Immature Granulocytes: 0 %
Lymphocytes Relative: 30 %
Lymphs Abs: 2.5 10*3/uL (ref 0.7–4.0)
MCH: 32.3 pg (ref 26.0–34.0)
MCHC: 33 g/dL (ref 30.0–36.0)
MCV: 97.8 fL (ref 80.0–100.0)
Monocytes Absolute: 0.7 10*3/uL (ref 0.1–1.0)
Monocytes Relative: 8 %
Neutro Abs: 4.7 10*3/uL (ref 1.7–7.7)
Neutrophils Relative %: 59 %
Platelet Count: 205 10*3/uL (ref 150–400)
RBC: 4.64 MIL/uL (ref 4.22–5.81)
RDW: 12.7 % (ref 11.5–15.5)
WBC Count: 8.1 10*3/uL (ref 4.0–10.5)
nRBC: 0 % (ref 0.0–0.2)

## 2024-01-18 LAB — CMP (CANCER CENTER ONLY)
ALT: 17 U/L (ref 0–44)
AST: 21 U/L (ref 15–41)
Albumin: 3.9 g/dL (ref 3.5–5.0)
Alkaline Phosphatase: 77 U/L (ref 38–126)
Anion gap: 12 (ref 5–15)
BUN: 12 mg/dL (ref 8–23)
CO2: 25 mmol/L (ref 22–32)
Calcium: 9.6 mg/dL (ref 8.9–10.3)
Chloride: 104 mmol/L (ref 98–111)
Creatinine: 1.07 mg/dL (ref 0.61–1.24)
GFR, Estimated: >60 mL/min (ref >60)
Glucose, Bld: 115 mg/dL — ABNORMAL HIGH (ref 70–99)
Potassium: 3.9 mmol/L (ref 3.5–5.1)
Sodium: 141 mmol/L (ref 135–145)
Total Bilirubin: 0.3 mg/dL (ref 0.0–1.2)
Total Protein: 6.6 g/dL (ref 6.5–8.1)

## 2024-01-18 LAB — PSA: Prostatic Specific Antigen: 7.03 ng/mL — ABNORMAL HIGH (ref 0.00–4.00)

## 2024-01-18 MED ORDER — SODIUM CHLORIDE 0.9 % IV SOLN
200.0000 mg | Freq: Once | INTRAVENOUS | Status: AC
  Administered 2024-01-18: 200 mg via INTRAVENOUS
  Filled 2024-01-18: qty 200

## 2024-01-18 MED ORDER — SODIUM CHLORIDE 0.9% FLUSH
10.0000 mL | INTRAVENOUS | Status: DC | PRN
  Administered 2024-01-18: 10 mL

## 2024-01-18 MED ORDER — HEPARIN SOD (PORK) LOCK FLUSH 100 UNIT/ML IV SOLN
500.0000 [IU] | Freq: Once | INTRAVENOUS | Status: AC | PRN
  Administered 2024-01-18: 500 [IU]

## 2024-01-18 MED ORDER — SODIUM CHLORIDE 0.9 % IV SOLN: INTRAVENOUS | Status: DC

## 2024-01-18 NOTE — Patient Instructions (Signed)

## 2024-01-20 ENCOUNTER — Encounter: Payer: Self-pay | Admitting: Oncology

## 2024-01-21 ENCOUNTER — Telehealth: Payer: Self-pay | Admitting: Oncology

## 2024-01-21 DIAGNOSIS — L570 Actinic keratosis: Secondary | ICD-10-CM | POA: Diagnosis not present

## 2024-01-21 DIAGNOSIS — L578 Other skin changes due to chronic exposure to nonionizing radiation: Secondary | ICD-10-CM | POA: Diagnosis not present

## 2024-01-21 DIAGNOSIS — L821 Other seborrheic keratosis: Secondary | ICD-10-CM | POA: Diagnosis not present

## 2024-01-21 NOTE — Telephone Encounter (Signed)
 Patient has been scheduled for follow-up visit per 01/21/24 LOS.  Pt aware of scheduled appt details.

## 2024-01-22 ENCOUNTER — Other Ambulatory Visit: Payer: Self-pay

## 2024-01-23 ENCOUNTER — Other Ambulatory Visit: Payer: Self-pay

## 2024-01-25 DIAGNOSIS — F419 Anxiety disorder, unspecified: Secondary | ICD-10-CM | POA: Diagnosis not present

## 2024-01-25 DIAGNOSIS — C689 Malignant neoplasm of urinary organ, unspecified: Secondary | ICD-10-CM | POA: Diagnosis not present

## 2024-01-25 DIAGNOSIS — R600 Localized edema: Secondary | ICD-10-CM | POA: Diagnosis not present

## 2024-01-25 DIAGNOSIS — Z139 Encounter for screening, unspecified: Secondary | ICD-10-CM | POA: Diagnosis not present

## 2024-01-25 DIAGNOSIS — Z1331 Encounter for screening for depression: Secondary | ICD-10-CM | POA: Diagnosis not present

## 2024-01-25 DIAGNOSIS — L299 Pruritus, unspecified: Secondary | ICD-10-CM | POA: Diagnosis not present

## 2024-01-25 DIAGNOSIS — Z9181 History of falling: Secondary | ICD-10-CM | POA: Diagnosis not present

## 2024-02-06 NOTE — Progress Notes (Unsigned)
 [facility]  [address] [place],  [place]  [address] [phone]  Clinic Day:  02/08/2024  Referring physician: Aloha Arnold, PA-C   HISTORY OF PRESENT ILLNESS:  The patient is a 73 y.o. 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  He comes in today to be evaluated before heading into his 8th cycle of pembrolizumab .  The patient tolerated his 7th cycle of pembrolizumab  fairly well. He also reports brief episodes of double vision for the past few weeks, which resolves on it's own.  He denies blurry vision, light halos, shadows in his vision, eye pain or redness.  He reports nausea and vomiting after each treatment.  He reports generalized pruritus and scratchy throat with his last treatment.  He denies chronic nausea and vomiting.  He denies headaches, paresthesias or focal weakness.  He reports right flank pain increasing over past several weeks.  He reports burning pain in the neck and spine, which is different from his chronic pain. As it pertains to his disease, he denies having hematuria, increased urinary frequency, or other urinary symptoms which concern him for overt signs of disease progression.     This patient's bladder cancer history dates back to at least 2014.  Over time, he has had multiple bladder tumor resections.  He has also been exposed to BCG for his previously early-stage bladder cancers.  As his disease has returned and is now high-grade in nature, he is now on pembrolizumab  for his disease management.   PHYSICAL EXAM:  Blood pressure 103/69, pulse 91, temperature 98 F (36.7 C), temperature source Oral, resp. rate 20, height 5' 10.9" (1.801 m), weight 160 lb 12.8 oz (72.9 kg), SpO2 99%. Wt Readings from Last 3 Encounters:  02/08/24 160 lb 12.8 oz (72.9 kg)  12/28/23 161 lb 9.6 oz (73.3 kg)  12/07/23 162 lb 6.4 oz (73.7 kg)   Body mass index is 22.49 kg/m.  Performance status (ECOG): 1 - Symptomatic but completely ambulatory  Physical  Exam Vitals and nursing note reviewed.  Constitutional:      General: He is not in acute distress.    Appearance: Normal appearance. He is normal weight. He is not ill-appearing.  HENT:     Head: Normocephalic and atraumatic.     Mouth/Throat:     Mouth: Mucous membranes are moist.     Pharynx: Oropharynx is clear. No oropharyngeal exudate or posterior oropharyngeal erythema.  Eyes:     General: No scleral icterus.    Extraocular Movements: Extraocular movements intact.     Conjunctiva/sclera: Conjunctivae normal.     Pupils: Pupils are equal, round, and reactive to light.  Cardiovascular:     Rate and Rhythm: Normal rate and regular rhythm.     Heart sounds: Normal heart sounds. No murmur heard.    No friction rub. No gallop.  Pulmonary:     Effort: Pulmonary effort is normal.     Breath sounds: Normal breath sounds. No wheezing, rhonchi or rales.  Abdominal:     General: Bowel sounds are normal. There is no distension.     Palpations: Abdomen is soft. There is no mass.     Tenderness: There is no abdominal tenderness.  Musculoskeletal:        General: Normal range of motion.     Cervical back: Normal range of motion and neck supple. No tenderness.     Right lower leg: No edema.     Left lower leg: No edema.  Lymphadenopathy:  Cervical: No cervical adenopathy.     Upper Body:     Right upper body: No supraclavicular or axillary adenopathy.     Left upper body: No supraclavicular or axillary adenopathy.     Lower Body: No right inguinal adenopathy. No left inguinal adenopathy.  Skin:    General: Skin is warm and dry.     Coloration: Skin is not jaundiced.     Findings: No rash.  Neurological:     Mental Status: He is alert and oriented to person, place, and time.     Cranial Nerves: No cranial nerve deficit.  Psychiatric:        Mood and Affect: Mood normal.        Behavior: Behavior normal.        Thought Content: Thought content normal.    LABS:      Latest  Ref Rng & Units 02/08/2024    9:03 AM 01/18/2024    2:35 PM 12/28/2023    1:43 PM  CBC  WBC 4.0 - 10.5 K/uL 8.1  8.1  7.2   Hemoglobin 13.0 - 17.0 g/dL 16.1  09.6  04.5   Hematocrit 39.0 - 52.0 % 44.4  45.4  44.7   Platelets 150 - 400 K/uL 195  205  211     Latest Reference Range & Units 02/08/24 09:03  Neutrophils % 66  Lymphocytes % 25  Monocytes Relative % 6  Eosinophil % 2  Basophil % 1  Immature Granulocytes % 0  NEUT# 1.7 - 7.7 K/uL 5.3  Lymphs Abs 0.7 - 4.0 K/uL 2.1  Monocyte # 0.1 - 1.0 K/uL 0.5  Eosinophils Absolute 0.0 - 0.5 K/uL 0.1  Basophils Absolute 0.0 - 0.1 K/uL 0.1  Abs Immature Granulocytes 0.00 - 0.07 K/uL 0.01      Latest Ref Rng & Units 02/08/2024    9:03 AM 01/18/2024    2:35 PM 12/28/2023    1:43 PM  CMP  Glucose 70 - 99 mg/dL 409  811  914   BUN 8 - 23 mg/dL 7  12  15    Creatinine 0.61 - 1.24 mg/dL 7.82  9.56  2.13   Sodium 135 - 145 mmol/L 139  141  139   Potassium 3.5 - 5.1 mmol/L 3.2  3.9  3.7   Chloride 98 - 111 mmol/L 102  104  104   CO2 22 - 32 mmol/L 24  25  26    Calcium  8.9 - 10.3 mg/dL 9.2  9.6  9.2   Total Protein 6.5 - 8.1 g/dL 6.4  6.6  6.7   Total Bilirubin 0.0 - 1.2 mg/dL 0.7  0.3  0.3   Alkaline Phos 38 - 126 U/L 76  77  87   AST 15 - 41 U/L 20  21  17    ALT 0 - 44 U/L 14  17  11       Lab Results  Component Value Date   PSA1 7.2 (H) 01/01/2023       Component Value Date/Time   PSA1 7.2 (H) 01/01/2023 0956    Review Flowsheet       Latest Ref Rng & Units 01/01/2023  Oncology Labs  Prostate Specific Ag, Serum 0.0 - 4.0 ng/mL 7.2       STUDIES:  No results found.    ASSESSMENT & PLAN:   Assessment/Plan:  73 y.o. male with recurrent high-grade, but superficial, bladder cancer. He will proceed with his 8th cycle of pembrolizumab  immunotherapy today.  With respect to his continued generalized pruritus, I will give him Pepcid  IV today and recommended he use Pepcid  once or twice daily, in addition to hydroxyzine as needed  for itching.  As he has mild hypokalemia, I will have him take potassium chloride  20 mill equivalents daily for 7 days.  Due to his right flank pain, I will obtain urinalysis and urine culture, as well as abdominal x-ray today.  In respect to his double vision, I will schedule him with ophthalmology as soon as possible.  He was previously seen at [facility]. This is potentially secondary to his pembrolizumab , however, he does not have other eye symptoms.  He understands this and would like to proceed with treatment today.  He knows to contact our office if he has new or worsening symptoms.  We will see him back in 3 weeks before he heads into his 8th cycle of pembrolizumab .  The patient understands all the plans discussed today and is in agreement with them.  He knows to contact our office if he develops concerns prior to his next appointment.     Alfonso Ike, PA-C   Physician Assistant [facility] [phone]

## 2024-02-08 ENCOUNTER — Encounter: Payer: Self-pay | Admitting: Hematology and Oncology

## 2024-02-08 ENCOUNTER — Encounter: Payer: Self-pay | Admitting: Oncology

## 2024-02-08 ENCOUNTER — Telehealth: Payer: Self-pay

## 2024-02-08 ENCOUNTER — Inpatient Hospital Stay

## 2024-02-08 ENCOUNTER — Other Ambulatory Visit (HOSPITAL_BASED_OUTPATIENT_CLINIC_OR_DEPARTMENT_OTHER): Payer: Self-pay

## 2024-02-08 ENCOUNTER — Ambulatory Visit (INDEPENDENT_AMBULATORY_CARE_PROVIDER_SITE_OTHER)
Admission: RE | Admit: 2024-02-08 | Discharge: 2024-02-08 | Disposition: A | Discharge status: Home / Self Care | Source: Ambulatory Visit | Attending: Hematology and Oncology | Admitting: Hematology and Oncology

## 2024-02-08 ENCOUNTER — Inpatient Hospital Stay: Admitting: Hematology and Oncology

## 2024-02-08 VITALS — BP 103/69 | HR 91 | Temp 98.0°F | Resp 20 | Ht 70.9 in | Wt 160.8 lb

## 2024-02-08 DIAGNOSIS — C679 Malignant neoplasm of bladder, unspecified: Secondary | ICD-10-CM | POA: Diagnosis not present

## 2024-02-08 DIAGNOSIS — R109 Unspecified abdominal pain: Secondary | ICD-10-CM | POA: Diagnosis not present

## 2024-02-08 DIAGNOSIS — E876 Hypokalemia: Secondary | ICD-10-CM | POA: Diagnosis not present

## 2024-02-08 DIAGNOSIS — Z79620 Long term (current) use of immunosuppressive biologic: Secondary | ICD-10-CM | POA: Diagnosis not present

## 2024-02-08 DIAGNOSIS — H532 Diplopia: Secondary | ICD-10-CM

## 2024-02-08 DIAGNOSIS — Z79899 Other long term (current) drug therapy: Secondary | ICD-10-CM | POA: Diagnosis not present

## 2024-02-08 DIAGNOSIS — I878 Other specified disorders of veins: Secondary | ICD-10-CM | POA: Diagnosis not present

## 2024-02-08 DIAGNOSIS — C678 Malignant neoplasm of overlapping sites of bladder: Secondary | ICD-10-CM

## 2024-02-08 DIAGNOSIS — Z5112 Encounter for antineoplastic immunotherapy: Secondary | ICD-10-CM | POA: Diagnosis not present

## 2024-02-08 LAB — CBC WITH DIFFERENTIAL (CANCER CENTER ONLY)
Abs Immature Granulocytes: 0.01 10*3/uL (ref 0.00–0.07)
Basophils Absolute: 0.1 10*3/uL (ref 0.0–0.1)
Basophils Relative: 1 %
Eosinophils Absolute: 0.1 10*3/uL (ref 0.0–0.5)
Eosinophils Relative: 2 %
HCT: 44.4 % (ref 39.0–52.0)
Hemoglobin: 14.8 g/dL (ref 13.0–17.0)
Immature Granulocytes: 0 %
Lymphocytes Relative: 25 %
Lymphs Abs: 2.1 10*3/uL (ref 0.7–4.0)
MCH: 32.5 pg (ref 26.0–34.0)
MCHC: 33.3 g/dL (ref 30.0–36.0)
MCV: 97.4 fL (ref 80.0–100.0)
Monocytes Absolute: 0.5 10*3/uL (ref 0.1–1.0)
Monocytes Relative: 6 %
Neutro Abs: 5.3 10*3/uL (ref 1.7–7.7)
Neutrophils Relative %: 66 %
Platelet Count: 195 10*3/uL (ref 150–400)
RBC: 4.56 MIL/uL (ref 4.22–5.81)
RDW: 12.2 % (ref 11.5–15.5)
WBC Count: 8.1 10*3/uL (ref 4.0–10.5)
nRBC: 0 % (ref 0.0–0.2)

## 2024-02-08 LAB — URINALYSIS, COMPLETE (UACMP) WITH MICROSCOPIC
Bacteria, UA: NONE SEEN
Bilirubin Urine: NEGATIVE
Glucose, UA: NEGATIVE mg/dL
Ketones, ur: NEGATIVE mg/dL
Nitrite: NEGATIVE
Protein, ur: NEGATIVE mg/dL
Specific Gravity, Urine: 1.01 (ref 1.005–1.030)
pH: 7 (ref 5.0–8.0)

## 2024-02-08 LAB — CMP (CANCER CENTER ONLY)
ALT: 14 U/L (ref 0–44)
AST: 20 U/L (ref 15–41)
Albumin: 4.1 g/dL (ref 3.5–5.0)
Alkaline Phosphatase: 76 U/L (ref 38–126)
Anion gap: 13 (ref 5–15)
BUN: 7 mg/dL — ABNORMAL LOW (ref 8–23)
CO2: 24 mmol/L (ref 22–32)
Calcium: 9.2 mg/dL (ref 8.9–10.3)
Chloride: 102 mmol/L (ref 98–111)
Creatinine: 1.09 mg/dL (ref 0.61–1.24)
GFR, Estimated: >60 mL/min (ref >60)
Glucose, Bld: 174 mg/dL — ABNORMAL HIGH (ref 70–99)
Potassium: 3.2 mmol/L — ABNORMAL LOW (ref 3.5–5.1)
Sodium: 139 mmol/L (ref 135–145)
Total Bilirubin: 0.7 mg/dL (ref 0.0–1.2)
Total Protein: 6.4 g/dL — ABNORMAL LOW (ref 6.5–8.1)

## 2024-02-08 MED ORDER — FAMOTIDINE IN NACL 20-0.9 MG/50ML-% IV SOLN
20.0000 mg | Freq: Once | INTRAVENOUS | Status: AC
  Administered 2024-02-08: 20 mg via INTRAVENOUS
  Filled 2024-02-08: qty 50

## 2024-02-08 MED ORDER — SULFAMETHOXAZOLE-TRIMETHOPRIM 800-160 MG PO TABS: 1.0000 | ORAL_TABLET | Freq: Two times a day (BID) | ORAL | 0 refills | Status: DC

## 2024-02-08 MED ORDER — SODIUM CHLORIDE 0.9% FLUSH
10.0000 mL | INTRAVENOUS | Status: DC | PRN
  Administered 2024-02-08: 10 mL

## 2024-02-08 MED ORDER — POTASSIUM CHLORIDE CRYS ER 20 MEQ PO TBCR
20.0000 meq | EXTENDED_RELEASE_TABLET | Freq: Every day | ORAL | 0 refills | Status: DC
  Filled 2024-02-08: qty 7, 7d supply, fill #0

## 2024-02-08 MED ORDER — SODIUM CHLORIDE 0.9 % IV SOLN: INTRAVENOUS | Status: DC

## 2024-02-08 MED ORDER — HEPARIN SOD (PORK) LOCK FLUSH 100 UNIT/ML IV SOLN
500.0000 [IU] | Freq: Once | INTRAVENOUS | Status: AC | PRN
  Administered 2024-02-08: 500 [IU]

## 2024-02-08 MED ORDER — SODIUM CHLORIDE 0.9 % IV SOLN
200.0000 mg | Freq: Once | INTRAVENOUS | Status: AC
  Administered 2024-02-08: 200 mg via INTRAVENOUS
  Filled 2024-02-08: qty 8

## 2024-02-08 NOTE — Patient Instructions (Signed)

## 2024-02-08 NOTE — Telephone Encounter (Signed)
 Patient notified and voiced understanding.

## 2024-02-08 NOTE — Telephone Encounter (Signed)
 Referral sent with confirmation

## 2024-02-08 NOTE — Telephone Encounter (Signed)
-----   Message from Alfonso Ike sent at 02/08/2024  1:25 PM EDT ----- Please let him know, no kidney stones seen on x-ray. Urine looks suspicious for infection and urine culture will not be available until this weekend, so I think we should start an antibiotic. I sent in Bactrim to [facility]. Thanks

## 2024-02-08 NOTE — Telephone Encounter (Signed)
-----   Message from Alfonso Ike sent at 02/08/2024 10:02 AM EDT ----- Please refer to ophthalmology STAT at [facility] for episodes of double vision while on Keytruda , he has been seen previously but last in 2018. Thanks

## 2024-02-09 LAB — URINE CULTURE: Culture: NO GROWTH

## 2024-02-13 ENCOUNTER — Other Ambulatory Visit: Payer: Self-pay

## 2024-02-18 DIAGNOSIS — G894 Chronic pain syndrome: Secondary | ICD-10-CM | POA: Diagnosis not present

## 2024-02-28 NOTE — Progress Notes (Unsigned)
 [facility]  [address] [place],  [place]  [address] [phone]  Clinic Day:  02/29/2024  Referring physician: Aloha Arnold, PA-C   HISTORY OF PRESENT ILLNESS:  The patient is a 73 y.o. 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  He comes in today to be evaluated before heading into his 9th cycle of pembrolizumab .  The patient tolerated his 8th cycle of pembrolizumab  fairly well.  As it pertains to his disease, he denies having hematuria, increased urinary frequency, or other urinary symptoms which concern him for overt signs of disease progression.  This patient's bladder cancer history dates back to at least 2014.  Over time, he has had multiple bladder tumor resections.  He has also been exposed to BCG for his previously early-stage bladder cancers.  As his disease has returned and is now high-grade in nature, he is now on pembrolizumab  for his disease management.  PHYSICAL EXAM:  Blood pressure (!) 148/76, pulse 72, temperature 97.8 F (36.6 C), temperature source Oral, resp. rate 16, height 5' 0.1 (1.527 m), weight 157 lb 14.4 oz (71.6 kg), SpO2 96%. Wt Readings from Last 3 Encounters:  02/29/24 157 lb 14.4 oz (71.6 kg)  02/08/24 160 lb 12.8 oz (72.9 kg)  12/28/23 161 lb 9.6 oz (73.3 kg)   Body mass index is 30.74 kg/m. Performance status (ECOG): 1 - Symptomatic but completely ambulatory Physical Exam Constitutional:      Appearance: Normal appearance. He is not ill-appearing.  HENT:     Mouth/Throat:     Mouth: Mucous membranes are moist.     Pharynx: Oropharynx is clear. No oropharyngeal exudate or posterior oropharyngeal erythema.   Cardiovascular:     Rate and Rhythm: Normal rate and regular rhythm.     Heart sounds: No murmur heard.    No friction rub. No gallop.  Pulmonary:     Effort: Pulmonary effort is normal. No respiratory distress.     Breath sounds: Normal breath sounds. No wheezing, rhonchi or rales.   Abdominal:     General: Bowel sounds are normal. There is no distension.     Palpations: Abdomen is soft. There is no mass.     Tenderness: There is no abdominal tenderness.   Musculoskeletal:        General: No swelling.     Right lower leg: No edema.     Left lower leg: No edema.  Lymphadenopathy:     Cervical: No cervical adenopathy.     Upper Body:     Right upper body: No supraclavicular or axillary adenopathy.     Left upper body: No supraclavicular or axillary adenopathy.     Lower Body: No right inguinal adenopathy. No left inguinal adenopathy.   Skin:    General: Skin is warm.     Coloration: Skin is not jaundiced.     Findings: No lesion or rash.   Neurological:     General: No focal deficit present.     Mental Status: He is alert and oriented to person, place, and time. Mental status is at baseline.   Psychiatric:        Mood and Affect: Mood normal.        Behavior: Behavior normal.        Thought Content: Thought content normal.    LABS:      Latest Ref Rng & Units 02/29/2024    9:46 AM 02/08/2024    9:03 AM 01/18/2024    2:35 PM  CBC  WBC 4.0 - 10.5 K/uL 7.5  8.1  8.1   Hemoglobin 13.0 - 17.0 g/dL 16.1  09.6  04.5   Hematocrit 39.0 - 52.0 % 45.3  44.4  45.4   Platelets 150 - 400 K/uL 253  195  205       Latest Ref Rng & Units 02/29/2024    9:46 AM 02/08/2024    9:03 AM 01/18/2024    2:35 PM  CMP  Glucose 70 - 99 mg/dL 409  811  914   BUN 8 - 23 mg/dL 11  7  12    Creatinine 0.61 - 1.24 mg/dL 7.82  9.56  2.13   Sodium 135 - 145 mmol/L 136  139  141   Potassium 3.5 - 5.1 mmol/L 3.7  3.2  3.9   Chloride 98 - 111 mmol/L 101  102  104   CO2 22 - 32 mmol/L 25  24  25    Calcium  8.9 - 10.3 mg/dL 9.6  9.2  9.6   Total Protein 6.5 - 8.1 g/dL 7.0  6.4  6.6   Total Bilirubin 0.0 - 1.2 mg/dL 0.7  0.7  0.3   Alkaline Phos 38 - 126 U/L 83  76  77   AST 15 - 41 U/L 21  20  21    ALT 0 - 44 U/L 13  14  17     ASSESSMENT & PLAN:  Assessment/Plan:  A 73 y.o. male  with recurrent high-grade, but superficial, bladder cancer. He will proceed with his 9th cycle of pembrolizumab  immunotherapy today.  Overall, the patient appears to be doing well.  I will see him back in 3 weeks before he heads into his 10th cycle of pembrolizumab .  The patient understands all the plans discussed today and is in agreement with them.   Kayson Bullis Felicia Horde, MD

## 2024-02-29 ENCOUNTER — Telehealth: Payer: Self-pay | Admitting: Oncology

## 2024-02-29 ENCOUNTER — Inpatient Hospital Stay: Attending: Oncology

## 2024-02-29 ENCOUNTER — Inpatient Hospital Stay (HOSPITAL_BASED_OUTPATIENT_CLINIC_OR_DEPARTMENT_OTHER): Attending: Oncology | Admitting: Oncology

## 2024-02-29 ENCOUNTER — Inpatient Hospital Stay

## 2024-02-29 VITALS — BP 148/76 | HR 72 | Temp 97.8°F | Resp 16 | Ht 60.1 in | Wt 157.9 lb

## 2024-02-29 DIAGNOSIS — C673 Malignant neoplasm of anterior wall of bladder: Secondary | ICD-10-CM | POA: Diagnosis not present

## 2024-02-29 DIAGNOSIS — Z5112 Encounter for antineoplastic immunotherapy: Secondary | ICD-10-CM | POA: Diagnosis not present

## 2024-02-29 DIAGNOSIS — C679 Malignant neoplasm of bladder, unspecified: Secondary | ICD-10-CM | POA: Diagnosis not present

## 2024-02-29 DIAGNOSIS — Z79620 Long term (current) use of immunosuppressive biologic: Secondary | ICD-10-CM | POA: Insufficient documentation

## 2024-02-29 LAB — CMP (CANCER CENTER ONLY)
ALT: 13 U/L (ref 0–44)
AST: 21 U/L (ref 15–41)
Albumin: 4.4 g/dL (ref 3.5–5.0)
Alkaline Phosphatase: 83 U/L (ref 38–126)
Anion gap: 10 (ref 5–15)
BUN: 11 mg/dL (ref 8–23)
CO2: 25 mmol/L (ref 22–32)
Calcium: 9.6 mg/dL (ref 8.9–10.3)
Chloride: 101 mmol/L (ref 98–111)
Creatinine: 1.01 mg/dL (ref 0.61–1.24)
GFR, Estimated: >60 mL/min (ref >60)
Glucose, Bld: 108 mg/dL — ABNORMAL HIGH (ref 70–99)
Potassium: 3.7 mmol/L (ref 3.5–5.1)
Sodium: 136 mmol/L (ref 135–145)
Total Bilirubin: 0.7 mg/dL (ref 0.0–1.2)
Total Protein: 7 g/dL (ref 6.5–8.1)

## 2024-02-29 LAB — CBC WITH DIFFERENTIAL (CANCER CENTER ONLY)
Abs Immature Granulocytes: 0.02 10*3/uL (ref 0.00–0.07)
Basophils Absolute: 0.1 10*3/uL (ref 0.0–0.1)
Basophils Relative: 1 %
Eosinophils Absolute: 0.1 10*3/uL (ref 0.0–0.5)
Eosinophils Relative: 2 %
HCT: 45.3 % (ref 39.0–52.0)
Hemoglobin: 15.2 g/dL (ref 13.0–17.0)
Immature Granulocytes: 0 %
Lymphocytes Relative: 21 %
Lymphs Abs: 1.6 10*3/uL (ref 0.7–4.0)
MCH: 32.5 pg (ref 26.0–34.0)
MCHC: 33.6 g/dL (ref 30.0–36.0)
MCV: 97 fL (ref 80.0–100.0)
Monocytes Absolute: 0.6 10*3/uL (ref 0.1–1.0)
Monocytes Relative: 8 %
Neutro Abs: 5.2 10*3/uL (ref 1.7–7.7)
Neutrophils Relative %: 68 %
Platelet Count: 253 10*3/uL (ref 150–400)
RBC: 4.67 MIL/uL (ref 4.22–5.81)
RDW: 12.6 % (ref 11.5–15.5)
WBC Count: 7.5 10*3/uL (ref 4.0–10.5)
nRBC: 0 % (ref 0.0–0.2)

## 2024-02-29 LAB — TSH: TSH: 2.676 u[IU]/mL (ref 0.350–4.500)

## 2024-02-29 MED ORDER — HEPARIN SOD (PORK) LOCK FLUSH 100 UNIT/ML IV SOLN: 500.0000 [IU] | Freq: Once | INTRAVENOUS | Status: DC | PRN

## 2024-02-29 MED ORDER — SODIUM CHLORIDE 0.9% FLUSH: 10.0000 mL | INTRAVENOUS | Status: DC | PRN

## 2024-02-29 MED ORDER — SODIUM CHLORIDE 0.9 % IV SOLN
INTRAVENOUS | Status: DC
Start: 2024-02-29 — End: 2024-02-29

## 2024-02-29 MED ORDER — SODIUM CHLORIDE 0.9 % IV SOLN
200.0000 mg | Freq: Once | INTRAVENOUS | Status: AC
  Administered 2024-02-29: 200 mg via INTRAVENOUS
  Filled 2024-02-29: qty 8

## 2024-02-29 MED ORDER — FAMOTIDINE IN NACL 20-0.9 MG/50ML-% IV SOLN
20.0000 mg | Freq: Once | INTRAVENOUS | Status: AC
  Administered 2024-02-29: 20 mg via INTRAVENOUS
  Filled 2024-02-29: qty 50

## 2024-02-29 NOTE — Patient Instructions (Signed)

## 2024-02-29 NOTE — Progress Notes (Signed)
 Pepcid  20 mg IV added as premedication for all cycles due to itching per Dr. Harles Lied.  [name] [place], [place], BCPS, BCOP 02/29/2024 11:17 AM

## 2024-02-29 NOTE — Telephone Encounter (Signed)
 Patient has been scheduled for follow-up visit per 02/29/24 LOS.  Pt given an appt calendar with date and time.

## 2024-03-01 ENCOUNTER — Other Ambulatory Visit: Payer: Self-pay

## 2024-03-01 LAB — T4: T4, Total: 7.3 ug/dL (ref 4.5–12.0)

## 2024-03-20 NOTE — Progress Notes (Signed)
 [facility]  [address] [place],  [place]  [address] [phone]  Clinic Day:  03/21/2024  Referring physician: Montey Lot, PA-C   HISTORY OF PRESENT ILLNESS:  The patient is a 73 y.o. 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  He comes in today to be evaluated before heading into his 10th cycle of pembrolizumab .  The patient tolerated his 9th cycle of pembrolizumab  fairly well.  As it pertains to his disease, he denies having hematuria, increased urinary frequency, or other urinary symptoms which concern him for overt signs of disease progression. The only problem he has had is increased nausea for the first 3-4 days after each dose of pembrolizumab .    This patient's bladder cancer history dates back to at least 2014.  Over time, he has had multiple bladder tumor resections.  He has also been exposed to BCG for his previously early-stage bladder cancers.  As his disease has returned and is now high-grade in nature, he is now on pembrolizumab  for his disease management.  PHYSICAL EXAM:  Blood pressure (!) 143/86, pulse 70, temperature 97.6 F (36.4 C), temperature source Oral, resp. rate 20, height 5' 0.1 (1.527 m), weight 157 lb 4.8 oz (71.4 kg), SpO2 96%. Wt Readings from Last 3 Encounters:  03/21/24 157 lb 4.8 oz (71.4 kg)  02/29/24 157 lb 14.4 oz (71.6 kg)  02/08/24 160 lb 12.8 oz (72.9 kg)   Body mass index is 30.62 kg/m. Performance status (ECOG): 1 - Symptomatic but completely ambulatory Physical Exam Constitutional:      Appearance: Normal appearance. He is not ill-appearing.  HENT:     Mouth/Throat:     Mouth: Mucous membranes are moist.     Pharynx: Oropharynx is clear. No oropharyngeal exudate or posterior oropharyngeal erythema.  Cardiovascular:     Rate and Rhythm: Normal rate and regular rhythm.     Heart sounds: No murmur heard.    No friction rub. No gallop.  Pulmonary:     Effort: Pulmonary effort is normal. No  respiratory distress.     Breath sounds: Normal breath sounds. No wheezing, rhonchi or rales.  Abdominal:     General: Bowel sounds are normal. There is no distension.     Palpations: Abdomen is soft. There is no mass.     Tenderness: There is no abdominal tenderness.  Musculoskeletal:        General: No swelling.     Right lower leg: No edema.     Left lower leg: No edema.  Lymphadenopathy:     Cervical: No cervical adenopathy.     Upper Body:     Right upper body: No supraclavicular or axillary adenopathy.     Left upper body: No supraclavicular or axillary adenopathy.     Lower Body: No right inguinal adenopathy. No left inguinal adenopathy.  Skin:    General: Skin is warm.     Coloration: Skin is not jaundiced.     Findings: No lesion or rash.  Neurological:     General: No focal deficit present.     Mental Status: He is alert and oriented to person, place, and time. Mental status is at baseline.  Psychiatric:        Mood and Affect: Mood normal.        Behavior: Behavior normal.        Thought Content: Thought content normal.    LABS:      Latest Ref Rng & Units 03/21/2024  10:38 AM 02/29/2024    9:46 AM 02/08/2024    9:03 AM  CBC  WBC 4.0 - 10.5 K/uL 6.7  7.5  8.1   Hemoglobin 13.0 - 17.0 g/dL 85.3  84.7  85.1   Hematocrit 39.0 - 52.0 % 43.1  45.3  44.4   Platelets 150 - 400 K/uL 199  253  195       Latest Ref Rng & Units 03/21/2024   10:38 AM 02/29/2024    9:46 AM 02/08/2024    9:03 AM  CMP  Glucose 70 - 99 mg/dL 892  891  825   BUN 8 - 23 mg/dL 14  11  7    Creatinine 0.61 - 1.24 mg/dL 9.03  8.98  8.90   Sodium 135 - 145 mmol/L 139  136  139   Potassium 3.5 - 5.1 mmol/L 3.8  3.7  3.2   Chloride 98 - 111 mmol/L 105  101  102   CO2 22 - 32 mmol/L 24  25  24    Calcium  8.9 - 10.3 mg/dL 9.2  9.6  9.2   Total Protein 6.5 - 8.1 g/dL 6.8  7.0  6.4   Total Bilirubin 0.0 - 1.2 mg/dL 0.7  0.7  0.7   Alkaline Phos 38 - 126 U/L 75  83  76   AST 15 - 41 U/L 21  21  20     ALT 0 - 44 U/L 13  13  14     ASSESSMENT & PLAN:  Assessment/Plan:  A 73 y.o. male with recurrent high-grade, but superficial, bladder cancer. He will proceed with his 10th cycle of pembrolizumab  immunotherapy today.  Overall, the patient appears to be doing well.  With respect to his nausea, I will prescribe him olanzapine  to take for 5 days after each cycle of immunotherapy.  Otherwise, I will see him back in 3 weeks before he heads into his 11th cycle of pembrolizumab .  The patient understands all the plans discussed today and is in agreement with them.   Crystallee Werden DELENA Kerns, MD

## 2024-03-21 ENCOUNTER — Other Ambulatory Visit: Payer: Self-pay | Admitting: Oncology

## 2024-03-21 ENCOUNTER — Encounter: Payer: Self-pay | Admitting: Oncology

## 2024-03-21 ENCOUNTER — Inpatient Hospital Stay (HOSPITAL_BASED_OUTPATIENT_CLINIC_OR_DEPARTMENT_OTHER): Admitting: Oncology

## 2024-03-21 ENCOUNTER — Inpatient Hospital Stay

## 2024-03-21 ENCOUNTER — Inpatient Hospital Stay: Attending: Oncology

## 2024-03-21 ENCOUNTER — Telehealth: Payer: Self-pay | Admitting: Oncology

## 2024-03-21 VITALS — BP 143/86 | HR 70 | Temp 97.6°F | Resp 20 | Ht 60.1 in | Wt 157.3 lb

## 2024-03-21 DIAGNOSIS — Z5112 Encounter for antineoplastic immunotherapy: Secondary | ICD-10-CM | POA: Insufficient documentation

## 2024-03-21 DIAGNOSIS — C679 Malignant neoplasm of bladder, unspecified: Secondary | ICD-10-CM | POA: Diagnosis not present

## 2024-03-21 DIAGNOSIS — C673 Malignant neoplasm of anterior wall of bladder: Secondary | ICD-10-CM | POA: Diagnosis not present

## 2024-03-21 DIAGNOSIS — R110 Nausea: Secondary | ICD-10-CM | POA: Insufficient documentation

## 2024-03-21 LAB — CMP (CANCER CENTER ONLY)
ALT: 13 U/L (ref 0–44)
AST: 21 U/L (ref 15–41)
Albumin: 4.1 g/dL (ref 3.5–5.0)
Alkaline Phosphatase: 75 U/L (ref 38–126)
Anion gap: 11 (ref 5–15)
BUN: 14 mg/dL (ref 8–23)
CO2: 24 mmol/L (ref 22–32)
Calcium: 9.2 mg/dL (ref 8.9–10.3)
Chloride: 105 mmol/L (ref 98–111)
Creatinine: 0.96 mg/dL (ref 0.61–1.24)
GFR, Estimated: >60 mL/min (ref >60)
Glucose, Bld: 107 mg/dL — ABNORMAL HIGH (ref 70–99)
Potassium: 3.8 mmol/L (ref 3.5–5.1)
Sodium: 139 mmol/L (ref 135–145)
Total Bilirubin: 0.7 mg/dL (ref 0.0–1.2)
Total Protein: 6.8 g/dL (ref 6.5–8.1)

## 2024-03-21 LAB — CBC WITH DIFFERENTIAL (CANCER CENTER ONLY)
Abs Immature Granulocytes: 0.02 K/uL (ref 0.00–0.07)
Basophils Absolute: 0.1 K/uL (ref 0.0–0.1)
Basophils Relative: 1 %
Eosinophils Absolute: 0.1 K/uL (ref 0.0–0.5)
Eosinophils Relative: 2 %
HCT: 43.1 % (ref 39.0–52.0)
Hemoglobin: 14.6 g/dL (ref 13.0–17.0)
Immature Granulocytes: 0 %
Immature Platelet Fraction: 1.4 % (ref 1.2–8.6)
Lymphocytes Relative: 25 %
Lymphs Abs: 1.7 K/uL (ref 0.7–4.0)
MCH: 32.8 pg (ref 26.0–34.0)
MCHC: 33.9 g/dL (ref 30.0–36.0)
MCV: 96.9 fL (ref 80.0–100.0)
Monocytes Absolute: 0.6 K/uL (ref 0.1–1.0)
Monocytes Relative: 9 %
Neutro Abs: 4.2 K/uL (ref 1.7–7.7)
Neutrophils Relative %: 63 %
Platelet Count: 199 K/uL (ref 150–400)
RBC: 4.45 MIL/uL (ref 4.22–5.81)
RDW: 13 % (ref 11.5–15.5)
WBC Count: 6.7 K/uL (ref 4.0–10.5)
nRBC: 0 % (ref 0.0–0.2)

## 2024-03-21 MED ORDER — SODIUM CHLORIDE 0.9 % IV SOLN
200.0000 mg | Freq: Once | INTRAVENOUS | Status: AC
  Administered 2024-03-21: 200 mg via INTRAVENOUS
  Filled 2024-03-21: qty 8

## 2024-03-21 MED ORDER — HEPARIN SOD (PORK) LOCK FLUSH 100 UNIT/ML IV SOLN
500.0000 [IU] | Freq: Once | INTRAVENOUS | Status: AC | PRN
  Administered 2024-03-21: 500 [IU]

## 2024-03-21 MED ORDER — OLANZAPINE 10 MG PO TABS: 10.0000 mg | ORAL_TABLET | Freq: Every day | ORAL | 3 refills | Status: DC

## 2024-03-21 MED ORDER — FAMOTIDINE IN NACL 20-0.9 MG/50ML-% IV SOLN
20.0000 mg | Freq: Once | INTRAVENOUS | Status: AC
  Administered 2024-03-21: 20 mg via INTRAVENOUS
  Filled 2024-03-21: qty 50

## 2024-03-21 MED ORDER — SODIUM CHLORIDE 0.9 % IV SOLN
INTRAVENOUS | Status: DC
Start: 2024-03-21 — End: 2024-03-21

## 2024-03-21 MED ORDER — SODIUM CHLORIDE 0.9% FLUSH
10.0000 mL | INTRAVENOUS | Status: DC | PRN
Start: 2024-03-21 — End: 2024-03-21
  Administered 2024-03-21: 10 mL

## 2024-03-21 NOTE — Patient Instructions (Signed)

## 2024-03-21 NOTE — Telephone Encounter (Signed)
 03/21/24 Per Dr Arlena does not need to see provider on 04/11/24(Dr Lewis on [facility])other provider is full.

## 2024-03-22 ENCOUNTER — Other Ambulatory Visit: Payer: Self-pay

## 2024-03-23 ENCOUNTER — Encounter: Payer: Self-pay | Admitting: Oncology

## 2024-04-02 ENCOUNTER — Other Ambulatory Visit: Payer: Self-pay

## 2024-04-07 ENCOUNTER — Encounter: Payer: Self-pay | Admitting: Oncology

## 2024-04-07 ENCOUNTER — Telehealth: Payer: Self-pay

## 2024-04-07 NOTE — Telephone Encounter (Signed)
 Pt called to make us  aware that doctor changed him from Praluent  to Repatha for his cholesterol. Will that be a problem with him taking the Keytruda ?   Megan Byrne,RPH: No concerns of interaction btwn Repatha and Keytruda 

## 2024-04-11 ENCOUNTER — Other Ambulatory Visit

## 2024-04-11 ENCOUNTER — Inpatient Hospital Stay

## 2024-04-11 ENCOUNTER — Ambulatory Visit: Admitting: Oncology

## 2024-04-11 ENCOUNTER — Inpatient Hospital Stay: Attending: Oncology

## 2024-04-11 ENCOUNTER — Ambulatory Visit

## 2024-04-11 VITALS — BP 115/70 | HR 70 | Temp 98.0°F | Resp 18

## 2024-04-11 DIAGNOSIS — C679 Malignant neoplasm of bladder, unspecified: Secondary | ICD-10-CM

## 2024-04-11 DIAGNOSIS — Z5112 Encounter for antineoplastic immunotherapy: Secondary | ICD-10-CM | POA: Insufficient documentation

## 2024-04-11 DIAGNOSIS — Z79620 Long term (current) use of immunosuppressive biologic: Secondary | ICD-10-CM | POA: Insufficient documentation

## 2024-04-11 LAB — CBC WITH DIFFERENTIAL (CANCER CENTER ONLY)
Abs Immature Granulocytes: 0.01 K/uL (ref 0.00–0.07)
Basophils Absolute: 0.1 K/uL (ref 0.0–0.1)
Basophils Relative: 1 %
Eosinophils Absolute: 0.2 K/uL (ref 0.0–0.5)
Eosinophils Relative: 3 %
HCT: 41.3 % (ref 39.0–52.0)
Hemoglobin: 14.1 g/dL (ref 13.0–17.0)
Immature Granulocytes: 0 %
Lymphocytes Relative: 22 %
Lymphs Abs: 1.8 K/uL (ref 0.7–4.0)
MCH: 33.1 pg (ref 26.0–34.0)
MCHC: 34.1 g/dL (ref 30.0–36.0)
MCV: 96.9 fL (ref 80.0–100.0)
Monocytes Absolute: 0.7 K/uL (ref 0.1–1.0)
Monocytes Relative: 9 %
Neutro Abs: 5.4 K/uL (ref 1.7–7.7)
Neutrophils Relative %: 65 %
Platelet Count: 181 K/uL (ref 150–400)
RBC: 4.26 MIL/uL (ref 4.22–5.81)
RDW: 12.6 % (ref 11.5–15.5)
WBC Count: 8.2 K/uL (ref 4.0–10.5)
nRBC: 0 % (ref 0.0–0.2)

## 2024-04-11 LAB — CMP (CANCER CENTER ONLY)
ALT: 13 U/L (ref 0–44)
AST: 18 U/L (ref 15–41)
Albumin: 4 g/dL (ref 3.5–5.0)
Alkaline Phosphatase: 73 U/L (ref 38–126)
Anion gap: 11 (ref 5–15)
BUN: 15 mg/dL (ref 8–23)
CO2: 25 mmol/L (ref 22–32)
Calcium: 9.2 mg/dL (ref 8.9–10.3)
Chloride: 106 mmol/L (ref 98–111)
Creatinine: 1.01 mg/dL (ref 0.61–1.24)
GFR, Estimated: >60 mL/min (ref >60)
Glucose, Bld: 126 mg/dL — ABNORMAL HIGH (ref 70–99)
Potassium: 3.5 mmol/L (ref 3.5–5.1)
Sodium: 142 mmol/L (ref 135–145)
Total Bilirubin: 0.5 mg/dL (ref 0.0–1.2)
Total Protein: 6.6 g/dL (ref 6.5–8.1)

## 2024-04-11 MED ORDER — SODIUM CHLORIDE 0.9 % IV SOLN: INTRAVENOUS | Status: DC

## 2024-04-11 MED ORDER — FAMOTIDINE IN NACL 20-0.9 MG/50ML-% IV SOLN
20.0000 mg | Freq: Once | INTRAVENOUS | Status: AC
  Administered 2024-04-11: 20 mg via INTRAVENOUS
  Filled 2024-04-11: qty 50

## 2024-04-11 MED ORDER — SODIUM CHLORIDE 0.9% FLUSH: 10.0000 mL | INTRAVENOUS | Status: DC | PRN

## 2024-04-11 MED ORDER — SODIUM CHLORIDE 0.9 % IV SOLN
200.0000 mg | Freq: Once | INTRAVENOUS | Status: AC
  Administered 2024-04-11: 200 mg via INTRAVENOUS
  Filled 2024-04-11: qty 8

## 2024-04-11 MED ORDER — HEPARIN SOD (PORK) LOCK FLUSH 100 UNIT/ML IV SOLN
500.0000 [IU] | Freq: Once | INTRAVENOUS | Status: DC | PRN
Start: 2024-04-11 — End: 2024-04-11

## 2024-04-11 NOTE — Patient Instructions (Signed)

## 2024-04-22 ENCOUNTER — Other Ambulatory Visit: Payer: Self-pay

## 2024-04-22 MED ORDER — POTASSIUM CHLORIDE CRYS ER 20 MEQ PO TBCR: 20.0000 meq | EXTENDED_RELEASE_TABLET | Freq: Every day | ORAL | 0 refills | Status: DC

## 2024-05-01 NOTE — Progress Notes (Unsigned)
 [facility]  [address] [place],  [place]  [address] [phone]  Clinic Day:  05/02/2024  Referring physician: Montey Lot, PA-C   HISTORY OF PRESENT ILLNESS:  The patient is a 73 y.o. 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  He comes in today to be evaluated before heading into his 12th cycle of pembrolizumab .  He had persistent nausea with each cycle of pembrolizumab , so was placed on olanzapine  to take for 5 days after each cycle of immunotherapy.  The patient tolerated his 11th cycle of pembrolizumab  fairly well.  He states he has persistent nausea only with eating for several days after pembrolizumab  with vomiting after eating.  This seems somewhat improved with olanzapine  at bedtime for 5 days after pembrolizumab .  He is not using many nutritional supplements due to cost.  As it pertains to his disease, he denies having hematuria, increased urinary frequency, or other urinary symptoms which concern him for overt signs of disease progression.     This patient's bladder cancer history dates back to at least 2014.  Over time, he has had multiple bladder tumor resections.  He has also been exposed to BCG for his previously early-stage bladder cancers.  As his disease has returned and is now high-grade in nature, he is now on pembrolizumab  for his disease management.   VITALS:   Blood pressure 122/87, pulse 60, temperature (!) 97.5 F (36.4 C), temperature source Oral, resp. rate 20, height 5' 11.5 (1.816 m), weight 155 lb 9.6 oz (70.6 kg), SpO2 93%. Wt Readings from Last 3 Encounters:  05/02/24 155 lb 9.6 oz (70.6 kg)  03/21/24 157 lb 4.8 oz (71.4 kg)  02/29/24 157 lb 14.4 oz (71.6 kg)   Body mass index is 21.4 kg/m.  Performance status (ECOG): 1 - Symptomatic but completely ambulatory  PHYSICAL EXAM:   Physical Exam Vitals and nursing note reviewed.  Constitutional:      General: He is not in acute distress.    Appearance: Normal appearance.  He is normal weight.  HENT:     Head: Normocephalic and atraumatic.     Mouth/Throat:     Mouth: Mucous membranes are moist.     Pharynx: Oropharynx is clear. No oropharyngeal exudate or posterior oropharyngeal erythema.  Eyes:     General: No scleral icterus.    Extraocular Movements: Extraocular movements intact.     Conjunctiva/sclera: Conjunctivae normal.     Pupils: Pupils are equal, round, and reactive to light.  Cardiovascular:     Rate and Rhythm: Normal rate and regular rhythm.     Heart sounds: Normal heart sounds. No murmur heard.    No friction rub. No gallop.  Pulmonary:     Effort: Pulmonary effort is normal.     Breath sounds: Normal breath sounds. No wheezing, rhonchi or rales.  Abdominal:     General: Bowel sounds are normal. There is no distension.     Palpations: Abdomen is soft. There is no mass.     Tenderness: There is no abdominal tenderness.  Musculoskeletal:        General: Normal range of motion.     Cervical back: Normal range of motion and neck supple. No tenderness.     Right lower leg: No edema.     Left lower leg: No edema.  Lymphadenopathy:     Cervical: No cervical adenopathy.  Skin:    General: Skin is warm and dry.     Coloration: Skin is not jaundiced.  Findings: No rash.  Neurological:     Mental Status: He is alert and oriented to person, place, and time.     Cranial Nerves: No cranial nerve deficit.  Psychiatric:        Mood and Affect: Mood normal.        Behavior: Behavior normal.        Thought Content: Thought content normal.      LABS:      Latest Ref Rng & Units 05/02/2024    9:45 AM 04/11/2024    9:03 AM 03/21/2024   10:38 AM  CBC  WBC 4.0 - 10.5 K/uL 6.4  8.2  6.7   Hemoglobin 13.0 - 17.0 g/dL 85.6  85.8  85.3   Hematocrit 39.0 - 52.0 % 42.5  41.3  43.1   Platelets 150 - 400 K/uL 191  181  199       Latest Ref Rng & Units 05/02/2024    9:45 AM 04/11/2024    9:03 AM 03/21/2024   10:38 AM  CMP  Glucose 70 - 99  mg/dL 889  873  892   BUN 8 - 23 mg/dL 8  15  14    Creatinine 0.61 - 1.24 mg/dL 9.01  8.98  9.03   Sodium 135 - 145 mmol/L 140  142  139   Potassium 3.5 - 5.1 mmol/L 4.0  3.5  3.8   Chloride 98 - 111 mmol/L 105  106  105   CO2 22 - 32 mmol/L 25  25  24    Calcium  8.9 - 10.3 mg/dL 9.2  9.2  9.2   Total Protein 6.5 - 8.1 g/dL 6.8  6.6  6.8   Total Bilirubin 0.0 - 1.2 mg/dL 0.7  0.5  0.7   Alkaline Phos 38 - 126 U/L 70  73  75   AST 15 - 41 U/L 15  18  21    ALT 0 - 44 U/L 10  13  13       Lab Results  Component Value Date   PSA1 7.2 (H) 01/01/2023       Component Value Date/Time   PSA1 7.2 (H) 01/01/2023 0956    Review Flowsheet       Latest Ref Rng & Units 01/01/2023  Oncology Labs  Prostate Specific Ag, Serum 0.0 - 4.0 ng/mL 7.2      STUDIES:   No results found.    ASSESSMENT & PLAN:   Assessment/Plan:  73 y.o. male with  recurrent high-grade, but superficial, bladder cancer.  Overall, the patient appears to be doing fairly well.  He will proceed with his 12th cycle of pembrolizumab  immunotherapy today.   I recommended he continue the olanzapine  as prescribed.  We gave him some samples of nutritional supplements today.  I will see him back in 3 weeks before he heads into his 13th cycle of pembrolizumab .   The patient understands all the plans discussed today and is in agreement with them.  He knows to contact our office if he develops concerns prior to his next appointment.     Andrez DELENA Foy, PA-C   Physician Assistant [facility] [address] [phone]

## 2024-05-02 ENCOUNTER — Telehealth: Payer: Self-pay | Admitting: Hematology and Oncology

## 2024-05-02 ENCOUNTER — Other Ambulatory Visit: Payer: Self-pay

## 2024-05-02 ENCOUNTER — Encounter: Payer: Self-pay | Admitting: Hematology and Oncology

## 2024-05-02 ENCOUNTER — Other Ambulatory Visit

## 2024-05-02 ENCOUNTER — Inpatient Hospital Stay: Admitting: Hematology and Oncology

## 2024-05-02 ENCOUNTER — Inpatient Hospital Stay

## 2024-05-02 ENCOUNTER — Ambulatory Visit

## 2024-05-02 ENCOUNTER — Ambulatory Visit: Admitting: Oncology

## 2024-05-02 VITALS — BP 122/87 | HR 60 | Temp 97.5°F | Resp 20 | Ht 71.5 in | Wt 155.6 lb

## 2024-05-02 DIAGNOSIS — C679 Malignant neoplasm of bladder, unspecified: Secondary | ICD-10-CM

## 2024-05-02 DIAGNOSIS — Z5112 Encounter for antineoplastic immunotherapy: Secondary | ICD-10-CM | POA: Diagnosis not present

## 2024-05-02 DIAGNOSIS — Z79620 Long term (current) use of immunosuppressive biologic: Secondary | ICD-10-CM | POA: Diagnosis not present

## 2024-05-02 DIAGNOSIS — C673 Malignant neoplasm of anterior wall of bladder: Secondary | ICD-10-CM | POA: Diagnosis not present

## 2024-05-02 LAB — CBC WITH DIFFERENTIAL (CANCER CENTER ONLY)
Abs Immature Granulocytes: 0.01 K/uL (ref 0.00–0.07)
Basophils Absolute: 0.1 K/uL (ref 0.0–0.1)
Basophils Relative: 2 %
Eosinophils Absolute: 0.2 K/uL (ref 0.0–0.5)
Eosinophils Relative: 3 %
HCT: 42.5 % (ref 39.0–52.0)
Hemoglobin: 14.3 g/dL (ref 13.0–17.0)
Immature Granulocytes: 0 %
Lymphocytes Relative: 23 %
Lymphs Abs: 1.4 K/uL (ref 0.7–4.0)
MCH: 32.9 pg (ref 26.0–34.0)
MCHC: 33.6 g/dL (ref 30.0–36.0)
MCV: 97.7 fL (ref 80.0–100.0)
Monocytes Absolute: 0.5 K/uL (ref 0.1–1.0)
Monocytes Relative: 8 %
Neutro Abs: 4.2 K/uL (ref 1.7–7.7)
Neutrophils Relative %: 64 %
Platelet Count: 191 K/uL (ref 150–400)
RBC: 4.35 MIL/uL (ref 4.22–5.81)
RDW: 12.6 % (ref 11.5–15.5)
WBC Count: 6.4 K/uL (ref 4.0–10.5)
nRBC: 0 % (ref 0.0–0.2)

## 2024-05-02 LAB — CMP (CANCER CENTER ONLY)
ALT: 10 U/L (ref 0–44)
AST: 15 U/L (ref 15–41)
Albumin: 4.2 g/dL (ref 3.5–5.0)
Alkaline Phosphatase: 70 U/L (ref 38–126)
Anion gap: 10 (ref 5–15)
BUN: 8 mg/dL (ref 8–23)
CO2: 25 mmol/L (ref 22–32)
Calcium: 9.2 mg/dL (ref 8.9–10.3)
Chloride: 105 mmol/L (ref 98–111)
Creatinine: 0.98 mg/dL (ref 0.61–1.24)
GFR, Estimated: >60 mL/min (ref >60)
Glucose, Bld: 110 mg/dL — ABNORMAL HIGH (ref 70–99)
Potassium: 4 mmol/L (ref 3.5–5.1)
Sodium: 140 mmol/L (ref 135–145)
Total Bilirubin: 0.7 mg/dL (ref 0.0–1.2)
Total Protein: 6.8 g/dL (ref 6.5–8.1)

## 2024-05-02 LAB — TSH: TSH: 2.872 u[IU]/mL (ref 0.350–4.500)

## 2024-05-02 MED ORDER — SODIUM CHLORIDE 0.9 % IV SOLN
200.0000 mg | Freq: Once | INTRAVENOUS | Status: AC
  Administered 2024-05-02: 200 mg via INTRAVENOUS
  Filled 2024-05-02: qty 8

## 2024-05-02 MED ORDER — SODIUM CHLORIDE 0.9 % IV SOLN: INTRAVENOUS | Status: DC

## 2024-05-02 MED ORDER — FAMOTIDINE IN NACL 20-0.9 MG/50ML-% IV SOLN
20.0000 mg | Freq: Once | INTRAVENOUS | Status: AC
  Administered 2024-05-02: 20 mg via INTRAVENOUS
  Filled 2024-05-02: qty 50

## 2024-05-02 NOTE — Telephone Encounter (Signed)
 Patient has been scheduled for follow-up visit per 05/02/24 LOS.  Pt given an appt calendar with date and time.

## 2024-05-02 NOTE — Patient Instructions (Signed)

## 2024-05-03 LAB — T4: T4, Total: 7.8 ug/dL (ref 4.5–12.0)

## 2024-05-04 ENCOUNTER — Other Ambulatory Visit: Payer: Self-pay

## 2024-05-14 DIAGNOSIS — D485 Neoplasm of uncertain behavior of skin: Secondary | ICD-10-CM | POA: Diagnosis not present

## 2024-05-14 DIAGNOSIS — L820 Inflamed seborrheic keratosis: Secondary | ICD-10-CM | POA: Diagnosis not present

## 2024-05-14 DIAGNOSIS — L814 Other melanin hyperpigmentation: Secondary | ICD-10-CM | POA: Diagnosis not present

## 2024-05-14 DIAGNOSIS — L578 Other skin changes due to chronic exposure to nonionizing radiation: Secondary | ICD-10-CM | POA: Diagnosis not present

## 2024-05-22 NOTE — Progress Notes (Unsigned)
 [facility]  [address] [place],  [place]  [address] [phone]  Clinic Day:  03/21/2024  Referring physician: Montey Lot, PA-C   HISTORY OF PRESENT ILLNESS:  The patient is a 73 y.o. 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  He comes in today to be evaluated before heading into his 13th cycle of pembrolizumab .  The patient tolerated his 9th cycle of pembrolizumab  fairly well.  As it pertains to his disease, he denies having hematuria, increased urinary frequency, or other urinary symptoms which concern him for overt signs of disease progression. The only problem he has had is increased nausea for the first 3-4 days after each dose of pembrolizumab .    This patient's bladder cancer history dates back to at least 2014.  Over time, he has had multiple bladder tumor resections.  He has also been exposed to BCG for his previously early-stage bladder cancers.  As his disease has returned and is now high-grade in nature, he is now on pembrolizumab  for his disease management.  PHYSICAL EXAM:  There were no vitals taken for this visit. Wt Readings from Last 3 Encounters:  05/02/24 155 lb 9.6 oz (70.6 kg)  03/21/24 157 lb 4.8 oz (71.4 kg)  02/29/24 157 lb 14.4 oz (71.6 kg)   There is no height or weight on file to calculate BMI. Performance status (ECOG): 1 - Symptomatic but completely ambulatory Physical Exam Constitutional:      Appearance: Normal appearance. He is not ill-appearing.  HENT:     Mouth/Throat:     Mouth: Mucous membranes are moist.     Pharynx: Oropharynx is clear. No oropharyngeal exudate or posterior oropharyngeal erythema.  Cardiovascular:     Rate and Rhythm: Normal rate and regular rhythm.     Heart sounds: No murmur heard.    No friction rub. No gallop.  Pulmonary:     Effort: Pulmonary effort is normal. No respiratory distress.     Breath sounds: Normal breath sounds. No wheezing, rhonchi or rales.  Abdominal:      General: Bowel sounds are normal. There is no distension.     Palpations: Abdomen is soft. There is no mass.     Tenderness: There is no abdominal tenderness.  Musculoskeletal:        General: No swelling.     Right lower leg: No edema.     Left lower leg: No edema.  Lymphadenopathy:     Cervical: No cervical adenopathy.     Upper Body:     Right upper body: No supraclavicular or axillary adenopathy.     Left upper body: No supraclavicular or axillary adenopathy.     Lower Body: No right inguinal adenopathy. No left inguinal adenopathy.  Skin:    General: Skin is warm.     Coloration: Skin is not jaundiced.     Findings: No lesion or rash.  Neurological:     General: No focal deficit present.     Mental Status: He is alert and oriented to person, place, and time. Mental status is at baseline.  Psychiatric:        Mood and Affect: Mood normal.        Behavior: Behavior normal.        Thought Content: Thought content normal.    LABS:      Latest Ref Rng & Units 05/02/2024    9:45 AM 04/11/2024    9:03 AM 03/21/2024   10:38 AM  CBC  WBC 4.0 - 10.5 K/uL 6.4  8.2  6.7   Hemoglobin 13.0 - 17.0 g/dL 85.6  85.8  85.3   Hematocrit 39.0 - 52.0 % 42.5  41.3  43.1   Platelets 150 - 400 K/uL 191  181  199       Latest Ref Rng & Units 05/02/2024    9:45 AM 04/11/2024    9:03 AM 03/21/2024   10:38 AM  CMP  Glucose 70 - 99 mg/dL 889  873  892   BUN 8 - 23 mg/dL 8  15  14    Creatinine 0.61 - 1.24 mg/dL 9.01  8.98  9.03   Sodium 135 - 145 mmol/L 140  142  139   Potassium 3.5 - 5.1 mmol/L 4.0  3.5  3.8   Chloride 98 - 111 mmol/L 105  106  105   CO2 22 - 32 mmol/L 25  25  24    Calcium  8.9 - 10.3 mg/dL 9.2  9.2  9.2   Total Protein 6.5 - 8.1 g/dL 6.8  6.6  6.8   Total Bilirubin 0.0 - 1.2 mg/dL 0.7  0.5  0.7   Alkaline Phos 38 - 126 U/L 70  73  75   AST 15 - 41 U/L 15  18  21    ALT 0 - 44 U/L 10  13  13     ASSESSMENT & PLAN:  Assessment/Plan:  A 73 y.o. male with recurrent high-grade,  but superficial, bladder cancer. He will proceed with his 10th cycle of pembrolizumab  immunotherapy today.  Overall, the patient appears to be doing well.  With respect to his nausea, I will prescribe him olanzapine  to take for 5 days after each cycle of immunotherapy.  Otherwise, I will see him back in 3 weeks before he heads into his 11th cycle of pembrolizumab .  The patient understands all the plans discussed today and is in agreement with them.   Olympia Adelsberger DELENA Kerns, MD

## 2024-05-23 ENCOUNTER — Other Ambulatory Visit: Payer: Self-pay | Admitting: Oncology

## 2024-05-23 ENCOUNTER — Inpatient Hospital Stay: Attending: Oncology

## 2024-05-23 ENCOUNTER — Inpatient Hospital Stay

## 2024-05-23 ENCOUNTER — Inpatient Hospital Stay (HOSPITAL_BASED_OUTPATIENT_CLINIC_OR_DEPARTMENT_OTHER): Admitting: Oncology

## 2024-05-23 ENCOUNTER — Telehealth: Payer: Self-pay | Admitting: Oncology

## 2024-05-23 VITALS — BP 116/75 | HR 63 | Temp 98.1°F | Resp 16 | Ht 71.5 in | Wt 153.7 lb

## 2024-05-23 DIAGNOSIS — C679 Malignant neoplasm of bladder, unspecified: Secondary | ICD-10-CM

## 2024-05-23 DIAGNOSIS — Z5112 Encounter for antineoplastic immunotherapy: Secondary | ICD-10-CM | POA: Diagnosis not present

## 2024-05-23 DIAGNOSIS — C678 Malignant neoplasm of overlapping sites of bladder: Secondary | ICD-10-CM

## 2024-05-23 DIAGNOSIS — R112 Nausea with vomiting, unspecified: Secondary | ICD-10-CM | POA: Insufficient documentation

## 2024-05-23 LAB — CMP (CANCER CENTER ONLY)
ALT: 10 U/L (ref 0–44)
AST: 17 U/L (ref 15–41)
Albumin: 4 g/dL (ref 3.5–5.0)
Alkaline Phosphatase: 71 U/L (ref 38–126)
Anion gap: 11 (ref 5–15)
BUN: 13 mg/dL (ref 8–23)
CO2: 25 mmol/L (ref 22–32)
Calcium: 9.3 mg/dL (ref 8.9–10.3)
Chloride: 105 mmol/L (ref 98–111)
Creatinine: 0.95 mg/dL (ref 0.61–1.24)
GFR, Estimated: >60 mL/min (ref >60)
Glucose, Bld: 97 mg/dL (ref 70–99)
Potassium: 4 mmol/L (ref 3.5–5.1)
Sodium: 142 mmol/L (ref 135–145)
Total Bilirubin: 0.6 mg/dL (ref 0.0–1.2)
Total Protein: 6.9 g/dL (ref 6.5–8.1)

## 2024-05-23 LAB — CBC WITH DIFFERENTIAL (CANCER CENTER ONLY)
Abs Immature Granulocytes: 0.02 K/uL (ref 0.00–0.07)
Basophils Absolute: 0.1 K/uL (ref 0.0–0.1)
Basophils Relative: 1 %
Eosinophils Absolute: 0.2 K/uL (ref 0.0–0.5)
Eosinophils Relative: 3 %
HCT: 44.5 % (ref 39.0–52.0)
Hemoglobin: 15.1 g/dL (ref 13.0–17.0)
Immature Granulocytes: 0 %
Lymphocytes Relative: 23 %
Lymphs Abs: 1.7 K/uL (ref 0.7–4.0)
MCH: 33.5 pg (ref 26.0–34.0)
MCHC: 33.9 g/dL (ref 30.0–36.0)
MCV: 98.7 fL (ref 80.0–100.0)
Monocytes Absolute: 0.7 K/uL (ref 0.1–1.0)
Monocytes Relative: 10 %
Neutro Abs: 4.8 K/uL (ref 1.7–7.7)
Neutrophils Relative %: 63 %
Platelet Count: 206 K/uL (ref 150–400)
RBC: 4.51 MIL/uL (ref 4.22–5.81)
RDW: 12.7 % (ref 11.5–15.5)
WBC Count: 7.6 K/uL (ref 4.0–10.5)
nRBC: 0 % (ref 0.0–0.2)

## 2024-05-23 MED ORDER — SODIUM CHLORIDE 0.9 % IV SOLN
200.0000 mg | Freq: Once | INTRAVENOUS | Status: AC
  Administered 2024-05-23: 200 mg via INTRAVENOUS
  Filled 2024-05-23 (×2): qty 8

## 2024-05-23 MED ORDER — FAMOTIDINE IN NACL 20-0.9 MG/50ML-% IV SOLN
20.0000 mg | Freq: Once | INTRAVENOUS | Status: AC
  Administered 2024-05-23: 20 mg via INTRAVENOUS
  Filled 2024-05-23: qty 50

## 2024-05-23 MED ORDER — PALONOSETRON HCL INJECTION 0.25 MG/5ML
0.2500 mg | Freq: Once | INTRAVENOUS | Status: AC
  Administered 2024-05-23: 0.25 mg via INTRAVENOUS
  Filled 2024-05-23: qty 5

## 2024-05-23 MED ORDER — OLANZAPINE 10 MG PO TABS: 10.0000 mg | ORAL_TABLET | Freq: Every day | ORAL | 3 refills | Status: AC

## 2024-05-23 MED ORDER — SODIUM CHLORIDE 0.9 % IV SOLN: INTRAVENOUS | Status: DC

## 2024-05-23 NOTE — Patient Instructions (Signed)

## 2024-05-23 NOTE — Telephone Encounter (Signed)
 Patient has been scheduled for follow-up visit per 05/23/24 LOS.  Pt given an appt calendar with date and time.

## 2024-05-25 ENCOUNTER — Other Ambulatory Visit: Payer: Self-pay

## 2024-06-13 ENCOUNTER — Inpatient Hospital Stay (HOSPITAL_BASED_OUTPATIENT_CLINIC_OR_DEPARTMENT_OTHER): Admitting: Hematology and Oncology

## 2024-06-13 ENCOUNTER — Inpatient Hospital Stay

## 2024-06-13 ENCOUNTER — Encounter: Payer: Self-pay | Admitting: Oncology

## 2024-06-13 ENCOUNTER — Other Ambulatory Visit: Payer: Self-pay

## 2024-06-13 ENCOUNTER — Inpatient Hospital Stay: Attending: Oncology

## 2024-06-13 VITALS — BP 118/80 | HR 63 | Temp 98.0°F | Resp 14 | Ht 71.5 in | Wt 153.3 lb

## 2024-06-13 DIAGNOSIS — Z5112 Encounter for antineoplastic immunotherapy: Secondary | ICD-10-CM | POA: Insufficient documentation

## 2024-06-13 DIAGNOSIS — R112 Nausea with vomiting, unspecified: Secondary | ICD-10-CM | POA: Insufficient documentation

## 2024-06-13 DIAGNOSIS — C679 Malignant neoplasm of bladder, unspecified: Secondary | ICD-10-CM

## 2024-06-13 DIAGNOSIS — Z79620 Long term (current) use of immunosuppressive biologic: Secondary | ICD-10-CM | POA: Insufficient documentation

## 2024-06-13 DIAGNOSIS — H532 Diplopia: Secondary | ICD-10-CM | POA: Insufficient documentation

## 2024-06-13 LAB — CBC WITH DIFFERENTIAL (CANCER CENTER ONLY)
Abs Immature Granulocytes: 0.02 K/uL (ref 0.00–0.07)
Basophils Absolute: 0.1 K/uL (ref 0.0–0.1)
Basophils Relative: 2 %
Eosinophils Absolute: 0.3 K/uL (ref 0.0–0.5)
Eosinophils Relative: 4 %
HCT: 45.3 % (ref 39.0–52.0)
Hemoglobin: 15.7 g/dL (ref 13.0–17.0)
Immature Granulocytes: 0 %
Lymphocytes Relative: 27 %
Lymphs Abs: 2.1 K/uL (ref 0.7–4.0)
MCH: 33.5 pg (ref 26.0–34.0)
MCHC: 34.7 g/dL (ref 30.0–36.0)
MCV: 96.8 fL (ref 80.0–100.0)
Monocytes Absolute: 0.7 K/uL (ref 0.1–1.0)
Monocytes Relative: 8 %
Neutro Abs: 4.5 K/uL (ref 1.7–7.7)
Neutrophils Relative %: 59 %
Platelet Count: 176 K/uL (ref 150–400)
RBC: 4.68 MIL/uL (ref 4.22–5.81)
RDW: 12.5 % (ref 11.5–15.5)
WBC Count: 7.8 K/uL (ref 4.0–10.5)
nRBC: 0 % (ref 0.0–0.2)

## 2024-06-13 LAB — CMP (CANCER CENTER ONLY)
ALT: 12 U/L (ref 0–44)
AST: 19 U/L (ref 15–41)
Albumin: 4.2 g/dL (ref 3.5–5.0)
Alkaline Phosphatase: 72 U/L (ref 38–126)
Anion gap: 10 (ref 5–15)
BUN: 13 mg/dL (ref 8–23)
CO2: 25 mmol/L (ref 22–32)
Calcium: 9.1 mg/dL (ref 8.9–10.3)
Chloride: 106 mmol/L (ref 98–111)
Creatinine: 0.91 mg/dL (ref 0.61–1.24)
GFR, Estimated: >60 mL/min (ref >60)
Glucose, Bld: 106 mg/dL — ABNORMAL HIGH (ref 70–99)
Potassium: 4.1 mmol/L (ref 3.5–5.1)
Sodium: 141 mmol/L (ref 135–145)
Total Bilirubin: 0.5 mg/dL (ref 0.0–1.2)
Total Protein: 7 g/dL (ref 6.5–8.1)

## 2024-06-13 MED ORDER — FAMOTIDINE IN NACL 20-0.9 MG/50ML-% IV SOLN
20.0000 mg | Freq: Once | INTRAVENOUS | Status: AC
  Administered 2024-06-13: 20 mg via INTRAVENOUS
  Filled 2024-06-13: qty 50

## 2024-06-13 MED ORDER — SODIUM CHLORIDE 0.9 % IV SOLN
200.0000 mg | Freq: Once | INTRAVENOUS | Status: AC
  Administered 2024-06-13: 200 mg via INTRAVENOUS
  Filled 2024-06-13: qty 200

## 2024-06-13 MED ORDER — SODIUM CHLORIDE 0.9 % IV SOLN: INTRAVENOUS | Status: DC

## 2024-06-13 NOTE — Progress Notes (Signed)
 [facility] [address] [place],  [place]  [address] [phone]  Clinic Day:  05/23/2024  Referring physician: Montey Lot, PA-C   HISTORY OF PRESENT ILLNESS:  The patient is a 73 y.o. 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  He comes in today to be evaluated before heading into his 13th cycle of pembrolizumab .  The patient tolerated his 129th cycle of pembrolizumab  fairly well.  As it pertains to his disease, he denies having hematuria, increased urinary frequency, or other urinary symptoms which concern him for overt signs of disease progression. The only problem he continues to have is increased nausea/vomiting for the first 3-4 days after each dose of pembrolizumab .    This patient's bladder cancer history dates back to at least 2014.  Over time, he has had multiple bladder tumor resections.  He has also been exposed to BCG for his previously early-stage bladder cancers.  As his disease has returned and is now high-grade in nature, he is now on pembrolizumab  for his disease management.  PHYSICAL EXAM:  There were no vitals taken for this visit. Wt Readings from Last 3 Encounters:  05/23/24 153 lb 11.2 oz (69.7 kg)  05/02/24 155 lb 9.6 oz (70.6 kg)  03/21/24 157 lb 4.8 oz (71.4 kg)   There is no height or weight on file to calculate BMI. Performance status (ECOG): 1 - Symptomatic but completely ambulatory Physical Exam Constitutional:      Appearance: Normal appearance. He is not ill-appearing.  HENT:     Mouth/Throat:     Mouth: Mucous membranes are moist.     Pharynx: Oropharynx is clear. No oropharyngeal exudate or posterior oropharyngeal erythema.  Cardiovascular:     Rate and Rhythm: Normal rate and regular rhythm.     Heart sounds: No murmur heard.    No friction rub. No gallop.  Pulmonary:     Effort: Pulmonary effort is normal. No respiratory distress.     Breath sounds: Normal breath sounds. No wheezing, rhonchi or  rales.  Abdominal:     General: Bowel sounds are normal. There is no distension.     Palpations: Abdomen is soft. There is no mass.     Tenderness: There is no abdominal tenderness.  Musculoskeletal:        General: No swelling.     Right lower leg: No edema.     Left lower leg: No edema.  Lymphadenopathy:     Cervical: No cervical adenopathy.     Upper Body:     Right upper body: No supraclavicular or axillary adenopathy.     Left upper body: No supraclavicular or axillary adenopathy.     Lower Body: No right inguinal adenopathy. No left inguinal adenopathy.  Skin:    General: Skin is warm.     Coloration: Skin is not jaundiced.     Findings: No lesion or rash.  Neurological:     General: No focal deficit present.     Mental Status: He is alert and oriented to person, place, and time. Mental status is at baseline.  Psychiatric:        Mood and Affect: Mood normal.        Behavior: Behavior normal.        Thought Content: Thought content normal.    LABS:      Latest Ref Rng & Units 06/13/2024    9:08 AM 05/23/2024    9:16 AM 05/02/2024    9:45 AM  CBC  WBC 4.0 - 10.5 K/uL 7.8  7.6  6.4   Hemoglobin 13.0 - 17.0 g/dL 84.2  84.8  85.6   Hematocrit 39.0 - 52.0 % 45.3  44.5  42.5   Platelets 150 - 400 K/uL 176  206  191       Latest Ref Rng & Units 05/23/2024    9:16 AM 05/02/2024    9:45 AM 04/11/2024    9:03 AM  CMP  Glucose 70 - 99 mg/dL 97  889  873   BUN 8 - 23 mg/dL 13  8  15    Creatinine 0.61 - 1.24 mg/dL 9.04  9.01  8.98   Sodium 135 - 145 mmol/L 142  140  142   Potassium 3.5 - 5.1 mmol/L 4.0  4.0  3.5   Chloride 98 - 111 mmol/L 105  105  106   CO2 22 - 32 mmol/L 25  25  25    Calcium  8.9 - 10.3 mg/dL 9.3  9.2  9.2   Total Protein 6.5 - 8.1 g/dL 6.9  6.8  6.6   Total Bilirubin 0.0 - 1.2 mg/dL 0.6  0.7  0.5   Alkaline Phos 38 - 126 U/L 71  70  73   AST 15 - 41 U/L 17  15  18    ALT 0 - 44 U/L 10  10  13     ASSESSMENT & PLAN:  Assessment/Plan:  A 73 y.o. male  with recurrent high-grade, but superficial, bladder cancer. He will proceed with his 14th cycle of pembrolizumab  immunotherapy today.  As he has nausea/vomiting for the first few days after each treatment. He states the aloxi  made it worse and does not want that today.  He also knows to take olanzapine  10 mg for the 1st 5 days after each cycle of immunotherapy.  Overall, the patient looks great.  I will see him back in 3 weeks before he heads into his 14th cycle of pembrolizumab .  The patient understands all the plans discussed today and is in agreement with them.   Eleanor DELENA Bach, NP

## 2024-06-13 NOTE — Patient Instructions (Signed)
 Pembrolizumab Injection What is this medication? PEMBROLIZUMAB (PEM broe LIZ ue mab) treats some types of cancer. It works by helping your immune system slow or stop the spread of cancer cells. It is a monoclonal antibody. This medicine may be used for other purposes; ask your health care provider or pharmacist if you have questions. COMMON BRAND NAME(S): Keytruda What should I tell my care team before I take this medication? They need to know if you have any of these conditions: Allogeneic stem cell transplant (uses someone else's stem cells) Autoimmune diseases, such as Crohn disease, ulcerative colitis, lupus History of chest radiation Nervous system problems, such as Guillain-Barre syndrome, myasthenia gravis Organ transplant An unusual or allergic reaction to pembrolizumab, other medications, foods, dyes, or preservatives Pregnant or trying to get pregnant Breast-feeding How should I use this medication? This medication is injected into a vein. It is given by your care team in a hospital or clinic setting. A special MedGuide will be given to you before each treatment. Be sure to read this information carefully each time. Talk to your care team about the use of this medication in children. While it may be prescribed for children as young as 6 months for selected conditions, precautions do apply. Overdosage: If you think you have taken too much of this medicine contact a poison control center or emergency room at once. NOTE: This medicine is only for you. Do not share this medicine with others. What if I miss a dose? Keep appointments for follow-up doses. It is important not to miss your dose. Call your care team if you are unable to keep an appointment. What may interact with this medication? Interactions have not been studied. This list may not describe all possible interactions. Give your health care provider a list of all the medicines, herbs, non-prescription drugs, or dietary  supplements you use. Also tell them if you smoke, drink alcohol, or use illegal drugs. Some items may interact with your medicine. What should I watch for while using this medication? Your condition will be monitored carefully while you are receiving this medication. You may need blood work while taking this medication. This medication may cause serious skin reactions. They can happen weeks to months after starting the medication. Contact your care team right away if you notice fevers or flu-like symptoms with a rash. The rash may be red or purple and then turn into blisters or peeling of the skin. You may also notice a red rash with swelling of the face, lips, or lymph nodes in your neck or under your arms. Tell your care team right away if you have any change in your eyesight. Talk to your care team if you may be pregnant. Serious birth defects can occur if you take this medication during pregnancy and for 4 months after the last dose. You will need a negative pregnancy test before starting this medication. Contraception is recommended while taking this medication and for 4 months after the last dose. Your care team can help you find the option that works for you. Do not breastfeed while taking this medication and for 4 months after the last dose. What side effects may I notice from receiving this medication? Side effects that you should report to your care team as soon as possible: Allergic reactions--skin rash, itching, hives, swelling of the face, lips, tongue, or throat Dry cough, shortness of breath or trouble breathing Eye pain, redness, irritation, or discharge with blurry or decreased vision Heart muscle inflammation--unusual weakness or  fatigue, shortness of breath, chest pain, fast or irregular heartbeat, dizziness, swelling of the ankles, feet, or hands Hormone gland problems--headache, sensitivity to light, unusual weakness or fatigue, dizziness, fast or irregular heartbeat, increased  sensitivity to cold or heat, excessive sweating, constipation, hair loss, increased thirst or amount of urine, tremors or shaking, irritability Infusion reactions--chest pain, shortness of breath or trouble breathing, feeling faint or lightheaded Kidney injury (glomerulonephritis)--decrease in the amount of urine, red or dark brown urine, foamy or bubbly urine, swelling of the ankles, hands, or feet Liver injury--right upper belly pain, loss of appetite, nausea, light-colored stool, dark yellow or brown urine, yellowing skin or eyes, unusual weakness or fatigue Pain, tingling, or numbness in the hands or feet, muscle weakness, change in vision, confusion or trouble speaking, loss of balance or coordination, trouble walking, seizures Rash, fever, and swollen lymph nodes Redness, blistering, peeling, or loosening of the skin, including inside the mouth Sudden or severe stomach pain, bloody diarrhea, fever, nausea, vomiting Side effects that usually do not require medical attention (report to your care team if they continue or are bothersome): Bone, joint, or muscle pain Diarrhea Fatigue Loss of appetite Nausea Skin rash This list may not describe all possible side effects. Call your doctor for medical advice about side effects. You may report side effects to FDA at 1-800-FDA-1088. Where should I keep my medication? This medication is given in a hospital or clinic. It will not be stored at home. NOTE: This sheet is a summary. It may not cover all possible information. If you have questions about this medicine, talk to your doctor, pharmacist, or health care provider.  2024 Elsevier/Gold Standard (2022-01-10 00:00:00) [facility] [place] - A DEPT OF [facility]  Discharge Instructions: Thank you for choosing [facility] to provide your oncology and hematology care.  If you have a lab appointment with the Cancer Center, please go directly to the Cancer Center  and check in at the registration area.   Wear comfortable clothing and clothing appropriate for easy access to any Portacath or PICC line.   We strive to give you quality time with your provider. You may need to reschedule your appointment if you arrive late (15 or more minutes).  Arriving late affects you and other patients whose appointments are after yours.  Also, if you miss three or more appointments without notifying the office, you may be dismissed from the clinic at the provider's discretion.      For prescription refill requests, have your pharmacy contact our office and allow 72 hours for refills to be completed.    Today you received the following chemotherapy and/or immunotherapy agents pembrolizumab      To help prevent nausea and vomiting after your treatment, we encourage you to take your nausea medication as directed.  BELOW ARE SYMPTOMS THAT SHOULD BE REPORTED IMMEDIATELY: *FEVER GREATER THAN 100.4 F (38 C) OR HIGHER *CHILLS OR SWEATING *NAUSEA AND VOMITING THAT IS NOT CONTROLLED WITH YOUR NAUSEA MEDICATION *UNUSUAL SHORTNESS OF BREATH *UNUSUAL BRUISING OR BLEEDING *URINARY PROBLEMS (pain or burning when urinating, or frequent urination) *BOWEL PROBLEMS (unusual diarrhea, constipation, pain near the anus) TENDERNESS IN MOUTH AND THROAT WITH OR WITHOUT PRESENCE OF ULCERS (sore throat, sores in mouth, or a toothache) UNUSUAL RASH, SWELLING OR PAIN  UNUSUAL VAGINAL DISCHARGE OR ITCHING   Items with * indicate a potential emergency and should be followed up as soon as possible or go to the Emergency Department if  problems should occur.  Please show the CHEMOTHERAPY ALERT CARD or IMMUNOTHERAPY ALERT CARD at check-in to the Emergency Department and triage nurse.  Should you have questions after your visit or need to cancel or reschedule your appointment, please contact [facility] [place] - A DEPT OF [facility]  Dept: [phone]  and follow the  prompts.  Office hours are 8:00 a.m. to 4:30 p.m. Monday - Friday. Please note that voicemails left after 4:00 p.m. may not be returned until the following business day.  We are closed weekends and major holidays. You have access to a nurse at all times for urgent questions. Please call the main number to the clinic Dept: [phone] and follow the prompts.  For any non-urgent questions, you may also contact your provider using MyChart. We now offer e-Visits for anyone 71 and older to request care online for non-urgent symptoms. For details visit [email or web address].   Also download the MyChart app! Go to the app store, search MyChart, open the app, select [place], and log in with your MyChart username and password.

## 2024-06-13 NOTE — Progress Notes (Signed)
 Patient requests to no longer receive aloxi  due to worsening nausea.

## 2024-06-15 ENCOUNTER — Other Ambulatory Visit: Payer: Self-pay

## 2024-06-19 ENCOUNTER — Other Ambulatory Visit: Payer: Self-pay

## 2024-07-02 DIAGNOSIS — M5412 Radiculopathy, cervical region: Secondary | ICD-10-CM | POA: Diagnosis not present

## 2024-07-02 DIAGNOSIS — M5459 Other low back pain: Secondary | ICD-10-CM | POA: Diagnosis not present

## 2024-07-02 DIAGNOSIS — M51369 Other intervertebral disc degeneration, lumbar region without mention of lumbar back pain or lower extremity pain: Secondary | ICD-10-CM | POA: Diagnosis not present

## 2024-07-02 DIAGNOSIS — M791 Myalgia, unspecified site: Secondary | ICD-10-CM | POA: Diagnosis not present

## 2024-07-02 DIAGNOSIS — Z5181 Encounter for therapeutic drug level monitoring: Secondary | ICD-10-CM | POA: Diagnosis not present

## 2024-07-03 NOTE — Progress Notes (Unsigned)
 [facility] at [facility] [address] [place],  [place]  [address] [phone]  Clinic Day:  05/23/2024  Referring physician: Montey Lot, PA-C   HISTORY OF PRESENT ILLNESS:  The patient is a 73 y.o. 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  He comes in today to be evaluated before heading into his 15th cycle of pembrolizumab .  The patient tolerated his 14th cycle of pembrolizumab  fairly well.  As it pertains to his disease, he denies having hematuria, increased urinary frequency, or other urinary symptoms which concern him for overt signs of disease progression. The only problem he continues to have is increased nausea/vomiting for the first 3-4 days after each dose of pembrolizumab .    This patient's bladder cancer history dates back to at least 2014.  Over time, he has had multiple bladder tumor resections.  He has also been exposed to BCG for his previously early-stage bladder cancers.  As his disease has returned and is now high-grade in nature, he is now on pembrolizumab  for his disease management.  PHYSICAL EXAM:  There were no vitals taken for this visit. Wt Readings from Last 3 Encounters:  06/13/24 153 lb 4.8 oz (69.5 kg)  05/23/24 153 lb 11.2 oz (69.7 kg)  05/02/24 155 lb 9.6 oz (70.6 kg)   There is no height or weight on file to calculate BMI. Performance status (ECOG): 1 - Symptomatic but completely ambulatory Physical Exam Constitutional:      Appearance: Normal appearance. He is not ill-appearing.  HENT:     Mouth/Throat:     Mouth: Mucous membranes are moist.     Pharynx: Oropharynx is clear. No oropharyngeal exudate or posterior oropharyngeal erythema.  Cardiovascular:     Rate and Rhythm: Normal rate and regular rhythm.     Heart sounds: No murmur heard.    No friction rub. No gallop.  Pulmonary:     Effort: Pulmonary effort is normal. No respiratory distress.     Breath sounds: Normal breath sounds. No wheezing, rhonchi or  rales.  Abdominal:     General: Bowel sounds are normal. There is no distension.     Palpations: Abdomen is soft. There is no mass.     Tenderness: There is no abdominal tenderness.  Musculoskeletal:        General: No swelling.     Right lower leg: No edema.     Left lower leg: No edema.  Lymphadenopathy:     Cervical: No cervical adenopathy.     Upper Body:     Right upper body: No supraclavicular or axillary adenopathy.     Left upper body: No supraclavicular or axillary adenopathy.     Lower Body: No right inguinal adenopathy. No left inguinal adenopathy.  Skin:    General: Skin is warm.     Coloration: Skin is not jaundiced.     Findings: No lesion or rash.  Neurological:     General: No focal deficit present.     Mental Status: He is alert and oriented to person, place, and time. Mental status is at baseline.  Psychiatric:        Mood and Affect: Mood normal.        Behavior: Behavior normal.        Thought Content: Thought content normal.    LABS:      Latest Ref Rng & Units 06/13/2024    9:08 AM 05/23/2024    9:16 AM 05/02/2024    9:45 AM  CBC  WBC 4.0 - 10.5 K/uL 7.8  7.6  6.4   Hemoglobin 13.0 - 17.0 g/dL 84.2  84.8  85.6   Hematocrit 39.0 - 52.0 % 45.3  44.5  42.5   Platelets 150 - 400 K/uL 176  206  191       Latest Ref Rng & Units 06/13/2024    9:08 AM 05/23/2024    9:16 AM 05/02/2024    9:45 AM  CMP  Glucose 70 - 99 mg/dL 893  97  889   BUN 8 - 23 mg/dL 13  13  8    Creatinine 0.61 - 1.24 mg/dL 9.08  9.04  9.01   Sodium 135 - 145 mmol/L 141  142  140   Potassium 3.5 - 5.1 mmol/L 4.1  4.0  4.0   Chloride 98 - 111 mmol/L 106  105  105   CO2 22 - 32 mmol/L 25  25  25    Calcium  8.9 - 10.3 mg/dL 9.1  9.3  9.2   Total Protein 6.5 - 8.1 g/dL 7.0  6.9  6.8   Total Bilirubin 0.0 - 1.2 mg/dL 0.5  0.6  0.7   Alkaline Phos 38 - 126 U/L 72  71  70   AST 15 - 41 U/L 19  17  15    ALT 0 - 44 U/L 12  10  10     ASSESSMENT & PLAN:  Assessment/Plan:  A 73 y.o. male  with recurrent high-grade, but superficial, bladder cancer. He will proceed with his 13th cycle of pembrolizumab  immunotherapy today.  As he has nausea/vomiting for the first few days after each treatment, I will add IV Aloxi  to see if this helps with his delayed nausea.  He also knows to take olanzapine  10 mg for the 1st 5 days after each cycle of immunotherapy.  Overall, the patient looks great.  I will see him back in 3 weeks before he heads into his 14th cycle of pembrolizumab .  The patient understands all the plans discussed today and is in agreement with them.   Jayra Choyce DELENA Kerns, MD

## 2024-07-04 ENCOUNTER — Other Ambulatory Visit: Payer: Self-pay | Admitting: Oncology

## 2024-07-04 ENCOUNTER — Inpatient Hospital Stay

## 2024-07-04 ENCOUNTER — Inpatient Hospital Stay (HOSPITAL_BASED_OUTPATIENT_CLINIC_OR_DEPARTMENT_OTHER): Admitting: Oncology

## 2024-07-04 VITALS — BP 144/63 | HR 72 | Temp 97.8°F | Resp 18 | Ht 71.5 in | Wt 154.9 lb

## 2024-07-04 DIAGNOSIS — Z5112 Encounter for antineoplastic immunotherapy: Secondary | ICD-10-CM | POA: Diagnosis not present

## 2024-07-04 DIAGNOSIS — C679 Malignant neoplasm of bladder, unspecified: Secondary | ICD-10-CM

## 2024-07-04 DIAGNOSIS — C671 Malignant neoplasm of dome of bladder: Secondary | ICD-10-CM | POA: Diagnosis not present

## 2024-07-04 DIAGNOSIS — Z85820 Personal history of malignant melanoma of skin: Secondary | ICD-10-CM | POA: Diagnosis not present

## 2024-07-04 DIAGNOSIS — H532 Diplopia: Secondary | ICD-10-CM | POA: Diagnosis not present

## 2024-07-04 LAB — CMP (CANCER CENTER ONLY)
ALT: 12 U/L (ref 0–44)
AST: 20 U/L (ref 15–41)
Albumin: 4.5 g/dL (ref 3.5–5.0)
Alkaline Phosphatase: 66 U/L (ref 38–126)
Anion gap: 9 (ref 5–15)
BUN: 12 mg/dL (ref 8–23)
CO2: 28 mmol/L (ref 22–32)
Calcium: 9.4 mg/dL (ref 8.9–10.3)
Chloride: 103 mmol/L (ref 98–111)
Creatinine: 1.03 mg/dL (ref 0.61–1.24)
GFR, Estimated: >60 mL/min (ref >60)
Glucose, Bld: 142 mg/dL — ABNORMAL HIGH (ref 70–99)
Potassium: 4.2 mmol/L (ref 3.5–5.1)
Sodium: 140 mmol/L (ref 135–145)
Total Bilirubin: 0.8 mg/dL (ref 0.0–1.2)
Total Protein: 7.1 g/dL (ref 6.5–8.1)

## 2024-07-04 LAB — CBC WITH DIFFERENTIAL (CANCER CENTER ONLY)
Abs Immature Granulocytes: 0.02 K/uL (ref 0.00–0.07)
Basophils Absolute: 0.1 K/uL (ref 0.0–0.1)
Basophils Relative: 1 %
Eosinophils Absolute: 0.3 K/uL (ref 0.0–0.5)
Eosinophils Relative: 3 %
HCT: 46.8 % (ref 39.0–52.0)
Hemoglobin: 15.8 g/dL (ref 13.0–17.0)
Immature Granulocytes: 0 %
Lymphocytes Relative: 25 %
Lymphs Abs: 2.1 K/uL (ref 0.7–4.0)
MCH: 32.7 pg (ref 26.0–34.0)
MCHC: 33.8 g/dL (ref 30.0–36.0)
MCV: 96.9 fL (ref 80.0–100.0)
Monocytes Absolute: 0.6 K/uL (ref 0.1–1.0)
Monocytes Relative: 8 %
Neutro Abs: 5.4 K/uL (ref 1.7–7.7)
Neutrophils Relative %: 63 %
Platelet Count: 178 K/uL (ref 150–400)
RBC: 4.83 MIL/uL (ref 4.22–5.81)
RDW: 12.4 % (ref 11.5–15.5)
WBC Count: 8.6 K/uL (ref 4.0–10.5)
nRBC: 0 % (ref 0.0–0.2)

## 2024-07-04 LAB — TSH: TSH: 2.05 u[IU]/mL (ref 0.350–4.500)

## 2024-07-04 MED ORDER — SODIUM CHLORIDE 0.9 % IV SOLN
200.0000 mg | Freq: Once | INTRAVENOUS | Status: AC
  Administered 2024-07-04: 200 mg via INTRAVENOUS
  Filled 2024-07-04: qty 200

## 2024-07-04 MED ORDER — SODIUM CHLORIDE 0.9 % IV SOLN: INTRAVENOUS | Status: DC

## 2024-07-04 MED ORDER — ALTEPLASE 2 MG IJ SOLR
2.0000 mg | Freq: Once | INTRAMUSCULAR | Status: AC | PRN
  Administered 2024-07-04: 2 mg
  Filled 2024-07-04: qty 2

## 2024-07-04 MED ORDER — SODIUM CHLORIDE 0.9% FLUSH
10.0000 mL | INTRAVENOUS | Status: DC | PRN
  Administered 2024-07-04: 10 mL

## 2024-07-04 MED ORDER — FAMOTIDINE IN NACL 20-0.9 MG/50ML-% IV SOLN
20.0000 mg | Freq: Once | INTRAVENOUS | Status: AC
  Administered 2024-07-04: 20 mg via INTRAVENOUS
  Filled 2024-07-04: qty 50

## 2024-07-04 NOTE — Patient Instructions (Signed)
 Pembrolizumab Injection What is this medication? PEMBROLIZUMAB (PEM broe LIZ ue mab) treats some types of cancer. It works by helping your immune system slow or stop the spread of cancer cells. It is a monoclonal antibody. This medicine may be used for other purposes; ask your health care provider or pharmacist if you have questions. COMMON BRAND NAME(S): Keytruda What should I tell my care team before I take this medication? They need to know if you have any of these conditions: Allogeneic stem cell transplant (uses someone else's stem cells) Autoimmune diseases, such as Crohn disease, ulcerative colitis, lupus History of chest radiation Nervous system problems, such as Guillain-Barre syndrome, myasthenia gravis Organ transplant An unusual or allergic reaction to pembrolizumab, other medications, foods, dyes, or preservatives Pregnant or trying to get pregnant Breast-feeding How should I use this medication? This medication is injected into a vein. It is given by your care team in a hospital or clinic setting. A special MedGuide will be given to you before each treatment. Be sure to read this information carefully each time. Talk to your care team about the use of this medication in children. While it may be prescribed for children as young as 6 months for selected conditions, precautions do apply. Overdosage: If you think you have taken too much of this medicine contact a poison control center or emergency room at once. NOTE: This medicine is only for you. Do not share this medicine with others. What if I miss a dose? Keep appointments for follow-up doses. It is important not to miss your dose. Call your care team if you are unable to keep an appointment. What may interact with this medication? Interactions have not been studied. This list may not describe all possible interactions. Give your health care provider a list of all the medicines, herbs, non-prescription drugs, or dietary  supplements you use. Also tell them if you smoke, drink alcohol, or use illegal drugs. Some items may interact with your medicine. What should I watch for while using this medication? Your condition will be monitored carefully while you are receiving this medication. You may need blood work while taking this medication. This medication may cause serious skin reactions. They can happen weeks to months after starting the medication. Contact your care team right away if you notice fevers or flu-like symptoms with a rash. The rash may be red or purple and then turn into blisters or peeling of the skin. You may also notice a red rash with swelling of the face, lips, or lymph nodes in your neck or under your arms. Tell your care team right away if you have any change in your eyesight. Talk to your care team if you may be pregnant. Serious birth defects can occur if you take this medication during pregnancy and for 4 months after the last dose. You will need a negative pregnancy test before starting this medication. Contraception is recommended while taking this medication and for 4 months after the last dose. Your care team can help you find the option that works for you. Do not breastfeed while taking this medication and for 4 months after the last dose. What side effects may I notice from receiving this medication? Side effects that you should report to your care team as soon as possible: Allergic reactions--skin rash, itching, hives, swelling of the face, lips, tongue, or throat Dry cough, shortness of breath or trouble breathing Eye pain, redness, irritation, or discharge with blurry or decreased vision Heart muscle inflammation--unusual weakness or  fatigue, shortness of breath, chest pain, fast or irregular heartbeat, dizziness, swelling of the ankles, feet, or hands Hormone gland problems--headache, sensitivity to light, unusual weakness or fatigue, dizziness, fast or irregular heartbeat, increased  sensitivity to cold or heat, excessive sweating, constipation, hair loss, increased thirst or amount of urine, tremors or shaking, irritability Infusion reactions--chest pain, shortness of breath or trouble breathing, feeling faint or lightheaded Kidney injury (glomerulonephritis)--decrease in the amount of urine, red or dark brown urine, foamy or bubbly urine, swelling of the ankles, hands, or feet Liver injury--right upper belly pain, loss of appetite, nausea, light-colored stool, dark yellow or brown urine, yellowing skin or eyes, unusual weakness or fatigue Pain, tingling, or numbness in the hands or feet, muscle weakness, change in vision, confusion or trouble speaking, loss of balance or coordination, trouble walking, seizures Rash, fever, and swollen lymph nodes Redness, blistering, peeling, or loosening of the skin, including inside the mouth Sudden or severe stomach pain, bloody diarrhea, fever, nausea, vomiting Side effects that usually do not require medical attention (report to your care team if they continue or are bothersome): Bone, joint, or muscle pain Diarrhea Fatigue Loss of appetite Nausea Skin rash This list may not describe all possible side effects. Call your doctor for medical advice about side effects. You may report side effects to FDA at 1-800-FDA-1088. Where should I keep my medication? This medication is given in a hospital or clinic. It will not be stored at home. NOTE: This sheet is a summary. It may not cover all possible information. If you have questions about this medicine, talk to your doctor, pharmacist, or health care provider.  2024 Elsevier/Gold Standard (2022-01-10 00:00:00)  [facility] [place] [facility]  Discharge Instructions: Thank you for choosing [facility] to provide your oncology and hematology care.  If you have a lab appointment with the Cancer Center, please go directly to the Cancer Center  and check in at the registration area.   Wear comfortable clothing and clothing appropriate for easy access to any Portacath or PICC line.   We strive to give you quality time with your provider. You may need to reschedule your appointment if you arrive late (15 or more minutes).  Arriving late affects you and other patients whose appointments are after yours.  Also, if you miss three or more appointments without notifying the office, you may be dismissed from the clinic at the provider's discretion.      For prescription refill requests, have your pharmacy contact our office and allow 72 hours for refills to be completed.    Today you received the following chemotherapy and/or immunotherapy agents PEMBROLIZUMAB      To help prevent nausea and vomiting after your treatment, we encourage you to take your nausea medication as directed.  BELOW ARE SYMPTOMS THAT SHOULD BE REPORTED IMMEDIATELY: *FEVER GREATER THAN 100.4 F (38 C) OR HIGHER *CHILLS OR SWEATING *NAUSEA AND VOMITING THAT IS NOT CONTROLLED WITH YOUR NAUSEA MEDICATION *UNUSUAL SHORTNESS OF BREATH *UNUSUAL BRUISING OR BLEEDING *URINARY PROBLEMS (pain or burning when urinating, or frequent urination) *BOWEL PROBLEMS (unusual diarrhea, constipation, pain near the anus) TENDERNESS IN MOUTH AND THROAT WITH OR WITHOUT PRESENCE OF ULCERS (sore throat, sores in mouth, or a toothache) UNUSUAL RASH, SWELLING OR PAIN  UNUSUAL VAGINAL DISCHARGE OR ITCHING   Items with * indicate a potential emergency and should be followed up as soon as possible or go to the Emergency Department  if any problems should occur.  Please show the CHEMOTHERAPY ALERT CARD or IMMUNOTHERAPY ALERT CARD at check-in to the Emergency Department and triage nurse.  Should you have questions after your visit or need to cancel or reschedule your appointment, please contact [facility] [place] [facility]  Dept: [phone]  and follow the  prompts.  Office hours are 8:00 a.m. to 4:30 p.m. Monday - Friday. Please note that voicemails left after 4:00 p.m. may not be returned until the following business day.  We are closed weekends and major holidays. You have access to a nurse at all times for urgent questions. Please call the main number to the clinic Dept: [phone] and follow the prompts.  For any non-urgent questions, you may also contact your provider using MyChart. We now offer e-Visits for anyone 78 and older to request care online for non-urgent symptoms. For details visit [email or web address].   Also download the MyChart app! Go to the app store, search "MyChart", open the app, select [facility], and log in with your MyChart username and password.

## 2024-07-05 LAB — T4: T4, Total: 7.9 ug/dL (ref 4.5–12.0)

## 2024-07-18 ENCOUNTER — Encounter: Payer: Self-pay | Admitting: Oncology

## 2024-07-24 NOTE — Progress Notes (Unsigned)
 [facility] at [facility] [address] [place],  [place]  [address] [phone]  Clinic Day:  07/25/2024  Referring physician: Montey Lot, PA-C   HISTORY OF PRESENT ILLNESS:  The patient is a 73 y.o. 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  He comes in today to be evaluated before heading into his 16th cycle of pembrolizumab .  The patient claims to have had significant pruritus after his 15th cycle of pembrolizumab .  It persisted for multiple weeks despite having oral antihistamines to address yes as it pertains to his disease, he denies having hematuria, increased urinary frequency, or other urinary symptoms which concern him for overt signs of disease progression.   This patient's bladder cancer history dates back to at least 2014.  Over time, he has had multiple bladder tumor resections.  He has also been exposed to BCG for his previously early-stage bladder cancers.  As his disease has returned and is now high-grade in nature, he is now on pembrolizumab  for his disease management.  PHYSICAL EXAM:  Blood pressure 123/72, pulse 71, temperature 97.7 F (36.5 C), temperature source Oral, resp. rate 16, height 5' 11.5 (1.816 m), weight 155 lb 12.8 oz (70.7 kg), SpO2 97%. Wt Readings from Last 3 Encounters:  07/25/24 155 lb 12.8 oz (70.7 kg)  07/04/24 154 lb 14.4 oz (70.3 kg)  06/13/24 153 lb 4.8 oz (69.5 kg)   Body mass index is 21.43 kg/m. Performance status (ECOG): 1 - Symptomatic but completely ambulatory Physical Exam Constitutional:      Appearance: Normal appearance. He is not ill-appearing.  HENT:     Mouth/Throat:     Mouth: Mucous membranes are moist.     Pharynx: Oropharynx is clear. No oropharyngeal exudate or posterior oropharyngeal erythema.  Cardiovascular:     Rate and Rhythm: Normal rate and regular rhythm.     Heart sounds: No murmur heard.    No friction rub. No gallop.  Pulmonary:     Effort: Pulmonary effort is normal. No  respiratory distress.     Breath sounds: Normal breath sounds. No wheezing, rhonchi or rales.  Abdominal:     General: Bowel sounds are normal. There is no distension.     Palpations: Abdomen is soft. There is no mass.     Tenderness: There is no abdominal tenderness.  Musculoskeletal:        General: No swelling.     Right lower leg: No edema.     Left lower leg: No edema.  Lymphadenopathy:     Cervical: No cervical adenopathy.     Upper Body:     Right upper body: No supraclavicular or axillary adenopathy.     Left upper body: No supraclavicular or axillary adenopathy.     Lower Body: No right inguinal adenopathy. No left inguinal adenopathy.  Skin:    General: Skin is warm.     Coloration: Skin is not jaundiced.     Findings: No lesion or rash.  Neurological:     General: No focal deficit present.     Mental Status: He is alert and oriented to person, place, and time. Mental status is at baseline.  Psychiatric:        Mood and Affect: Mood normal.        Behavior: Behavior normal.        Thought Content: Thought content normal.    LABS:      Latest Ref Rng & Units 07/25/2024    9:17 AM 07/04/2024  9:07 AM 06/13/2024    9:08 AM  CBC  WBC 4.0 - 10.5 K/uL 6.7  8.6  7.8   Hemoglobin 13.0 - 17.0 g/dL 85.2  84.1  84.2   Hematocrit 39.0 - 52.0 % 43.8  46.8  45.3   Platelets 150 - 400 K/uL 190  178  176       Latest Ref Rng & Units 07/25/2024    9:17 AM 07/04/2024    9:07 AM 06/13/2024    9:08 AM  CMP  Glucose 70 - 99 mg/dL 881  857  893   BUN 8 - 23 mg/dL 17  12  13    Creatinine 0.61 - 1.24 mg/dL 9.03  8.96  9.08   Sodium 135 - 145 mmol/L 142  140  141   Potassium 3.5 - 5.1 mmol/L 3.7  4.2  4.1   Chloride 98 - 111 mmol/L 107  103  106   CO2 22 - 32 mmol/L 25  28  25    Calcium  8.9 - 10.3 mg/dL 9.0  9.4  9.1   Total Protein 6.5 - 8.1 g/dL 7.5  7.1  7.0   Total Bilirubin 0.0 - 1.2 mg/dL 0.5  0.8  0.5   Alkaline Phos 38 - 126 U/L 63  66  72   AST 15 - 41 U/L 24  20   19    ALT 0 - 44 U/L 12  12  12     ASSESSMENT & PLAN:  Assessment/Plan:  A 73 y.o. male with recurrent high-grade, but superficial, bladder cancer. He will proceed with his 16th cycle of pembrolizumab  immunotherapy today.  With respect to his increased pruritus, I will give this gentleman both Pepcid  and Benadryl  IV to offset any significant itching he may incur over these next few days-weeks.  I also encouraged the patient to notify our office over these next few weeks if additional intervention is necessary to help with his pruritus. Otherwise, I will see him back in 3 weeks before he heads into his 17th cycle of pembrolizumab .  The patient understands all the plans discussed today and is in agreement with them.   Durante Violett DELENA Kerns, MD

## 2024-07-25 ENCOUNTER — Other Ambulatory Visit: Payer: Self-pay | Admitting: Pharmacist

## 2024-07-25 ENCOUNTER — Inpatient Hospital Stay: Attending: Oncology

## 2024-07-25 ENCOUNTER — Inpatient Hospital Stay (HOSPITAL_BASED_OUTPATIENT_CLINIC_OR_DEPARTMENT_OTHER): Admitting: Oncology

## 2024-07-25 ENCOUNTER — Encounter: Payer: Self-pay | Admitting: Oncology

## 2024-07-25 ENCOUNTER — Inpatient Hospital Stay

## 2024-07-25 VITALS — BP 123/72 | HR 71 | Temp 97.7°F | Resp 16 | Ht 71.5 in | Wt 155.8 lb

## 2024-07-25 DIAGNOSIS — C679 Malignant neoplasm of bladder, unspecified: Secondary | ICD-10-CM | POA: Insufficient documentation

## 2024-07-25 DIAGNOSIS — C678 Malignant neoplasm of overlapping sites of bladder: Secondary | ICD-10-CM | POA: Diagnosis not present

## 2024-07-25 DIAGNOSIS — Z23 Encounter for immunization: Secondary | ICD-10-CM | POA: Diagnosis not present

## 2024-07-25 DIAGNOSIS — Z5112 Encounter for antineoplastic immunotherapy: Secondary | ICD-10-CM | POA: Insufficient documentation

## 2024-07-25 LAB — CBC WITH DIFFERENTIAL (CANCER CENTER ONLY)
Abs Immature Granulocytes: 0.01 K/uL (ref 0.00–0.07)
Basophils Absolute: 0.1 K/uL (ref 0.0–0.1)
Basophils Relative: 1 %
Eosinophils Absolute: 0.4 K/uL (ref 0.0–0.5)
Eosinophils Relative: 6 %
HCT: 43.8 % (ref 39.0–52.0)
Hemoglobin: 14.7 g/dL (ref 13.0–17.0)
Immature Granulocytes: 0 %
Lymphocytes Relative: 26 %
Lymphs Abs: 1.7 K/uL (ref 0.7–4.0)
MCH: 32.6 pg (ref 26.0–34.0)
MCHC: 33.6 g/dL (ref 30.0–36.0)
MCV: 97.1 fL (ref 80.0–100.0)
Monocytes Absolute: 0.6 K/uL (ref 0.1–1.0)
Monocytes Relative: 9 %
Neutro Abs: 3.9 K/uL (ref 1.7–7.7)
Neutrophils Relative %: 58 %
Platelet Count: 190 K/uL (ref 150–400)
RBC: 4.51 MIL/uL (ref 4.22–5.81)
RDW: 12.2 % (ref 11.5–15.5)
WBC Count: 6.7 K/uL (ref 4.0–10.5)
nRBC: 0 % (ref 0.0–0.2)

## 2024-07-25 LAB — CMP (CANCER CENTER ONLY)
ALT: 12 U/L (ref 0–44)
AST: 24 U/L (ref 15–41)
Albumin: 4 g/dL (ref 3.5–5.0)
Alkaline Phosphatase: 63 U/L (ref 38–126)
Anion gap: 11 (ref 5–15)
BUN: 17 mg/dL (ref 8–23)
CO2: 25 mmol/L (ref 22–32)
Calcium: 9 mg/dL (ref 8.9–10.3)
Chloride: 107 mmol/L (ref 98–111)
Creatinine: 0.96 mg/dL (ref 0.61–1.24)
GFR, Estimated: >60 mL/min (ref >60)
Glucose, Bld: 118 mg/dL — ABNORMAL HIGH (ref 70–99)
Potassium: 3.7 mmol/L (ref 3.5–5.1)
Sodium: 142 mmol/L (ref 135–145)
Total Bilirubin: 0.5 mg/dL (ref 0.0–1.2)
Total Protein: 7.5 g/dL (ref 6.5–8.1)

## 2024-07-25 MED ORDER — DIPHENHYDRAMINE HCL 50 MG/ML IJ SOLN
25.0000 mg | Freq: Once | INTRAMUSCULAR | Status: AC
  Administered 2024-07-25: 25 mg via INTRAVENOUS
  Filled 2024-07-25: qty 1

## 2024-07-25 MED ORDER — INFLUENZA VAC SPLIT HIGH-DOSE 0.5 ML IM SUSY
0.5000 mL | PREFILLED_SYRINGE | Freq: Once | INTRAMUSCULAR | Status: AC
  Administered 2024-07-25: 0.5 mL via INTRAMUSCULAR
  Filled 2024-07-25: qty 0.5

## 2024-07-25 MED ORDER — SODIUM CHLORIDE 0.9% FLUSH: 10.0000 mL | INTRAVENOUS | Status: DC | PRN

## 2024-07-25 MED ORDER — FAMOTIDINE IN NACL 20-0.9 MG/50ML-% IV SOLN
20.0000 mg | Freq: Once | INTRAVENOUS | Status: AC
  Administered 2024-07-25: 20 mg via INTRAVENOUS
  Filled 2024-07-25: qty 50

## 2024-07-25 MED ORDER — SODIUM CHLORIDE 0.9 % IV SOLN
200.0000 mg | Freq: Once | INTRAVENOUS | Status: AC
  Administered 2024-07-25: 200 mg via INTRAVENOUS
  Filled 2024-07-25: qty 8

## 2024-07-25 MED ORDER — SODIUM CHLORIDE 0.9 % IV SOLN: INTRAVENOUS | Status: DC

## 2024-07-25 NOTE — Progress Notes (Signed)
 Add pepcid  40 mg + benadryl  25 mg to premeds due to itching from pembrolizumab  per Dr. Ezzard.

## 2024-07-25 NOTE — Patient Instructions (Signed)

## 2024-07-27 ENCOUNTER — Other Ambulatory Visit: Payer: Self-pay

## 2024-07-28 DIAGNOSIS — F419 Anxiety disorder, unspecified: Secondary | ICD-10-CM | POA: Diagnosis not present

## 2024-07-28 DIAGNOSIS — E785 Hyperlipidemia, unspecified: Secondary | ICD-10-CM | POA: Diagnosis not present

## 2024-07-28 DIAGNOSIS — J309 Allergic rhinitis, unspecified: Secondary | ICD-10-CM | POA: Diagnosis not present

## 2024-07-28 DIAGNOSIS — Z1331 Encounter for screening for depression: Secondary | ICD-10-CM | POA: Diagnosis not present

## 2024-07-28 DIAGNOSIS — I2510 Atherosclerotic heart disease of native coronary artery without angina pectoris: Secondary | ICD-10-CM | POA: Diagnosis not present

## 2024-07-28 DIAGNOSIS — R5383 Other fatigue: Secondary | ICD-10-CM | POA: Diagnosis not present

## 2024-07-28 DIAGNOSIS — C689 Malignant neoplasm of urinary organ, unspecified: Secondary | ICD-10-CM | POA: Diagnosis not present

## 2024-07-28 DIAGNOSIS — J449 Chronic obstructive pulmonary disease, unspecified: Secondary | ICD-10-CM | POA: Diagnosis not present

## 2024-07-30 ENCOUNTER — Other Ambulatory Visit: Payer: Self-pay

## 2024-07-30 ENCOUNTER — Ambulatory Visit: Attending: Cardiology | Admitting: Cardiology

## 2024-07-30 VITALS — BP 138/70 | HR 80 | Ht 72.0 in | Wt 153.6 lb

## 2024-07-30 DIAGNOSIS — Z951 Presence of aortocoronary bypass graft: Secondary | ICD-10-CM | POA: Diagnosis not present

## 2024-07-30 DIAGNOSIS — I509 Heart failure, unspecified: Secondary | ICD-10-CM | POA: Insufficient documentation

## 2024-07-30 DIAGNOSIS — I2510 Atherosclerotic heart disease of native coronary artery without angina pectoris: Secondary | ICD-10-CM

## 2024-07-30 DIAGNOSIS — D689 Coagulation defect, unspecified: Secondary | ICD-10-CM | POA: Insufficient documentation

## 2024-07-30 DIAGNOSIS — C689 Malignant neoplasm of urinary organ, unspecified: Secondary | ICD-10-CM | POA: Insufficient documentation

## 2024-07-30 DIAGNOSIS — E782 Mixed hyperlipidemia: Secondary | ICD-10-CM

## 2024-07-30 DIAGNOSIS — M1990 Unspecified osteoarthritis, unspecified site: Secondary | ICD-10-CM | POA: Insufficient documentation

## 2024-07-30 DIAGNOSIS — J309 Allergic rhinitis, unspecified: Secondary | ICD-10-CM | POA: Insufficient documentation

## 2024-07-30 DIAGNOSIS — I10 Essential (primary) hypertension: Secondary | ICD-10-CM

## 2024-07-30 DIAGNOSIS — Z5189 Encounter for other specified aftercare: Secondary | ICD-10-CM | POA: Insufficient documentation

## 2024-07-30 DIAGNOSIS — Z87891 Personal history of nicotine dependence: Secondary | ICD-10-CM | POA: Insufficient documentation

## 2024-07-30 DIAGNOSIS — R5383 Other fatigue: Secondary | ICD-10-CM | POA: Insufficient documentation

## 2024-07-30 DIAGNOSIS — I219 Acute myocardial infarction, unspecified: Secondary | ICD-10-CM | POA: Insufficient documentation

## 2024-07-30 DIAGNOSIS — G4730 Sleep apnea, unspecified: Secondary | ICD-10-CM | POA: Insufficient documentation

## 2024-07-30 DIAGNOSIS — D649 Anemia, unspecified: Secondary | ICD-10-CM | POA: Insufficient documentation

## 2024-07-30 DIAGNOSIS — G629 Polyneuropathy, unspecified: Secondary | ICD-10-CM | POA: Insufficient documentation

## 2024-07-30 DIAGNOSIS — F17210 Nicotine dependence, cigarettes, uncomplicated: Secondary | ICD-10-CM

## 2024-07-30 DIAGNOSIS — F419 Anxiety disorder, unspecified: Secondary | ICD-10-CM | POA: Insufficient documentation

## 2024-07-30 DIAGNOSIS — N433 Hydrocele, unspecified: Secondary | ICD-10-CM | POA: Insufficient documentation

## 2024-07-30 DIAGNOSIS — Z720 Tobacco use: Secondary | ICD-10-CM

## 2024-07-30 DIAGNOSIS — K635 Polyp of colon: Secondary | ICD-10-CM | POA: Insufficient documentation

## 2024-07-30 DIAGNOSIS — K219 Gastro-esophageal reflux disease without esophagitis: Secondary | ICD-10-CM | POA: Insufficient documentation

## 2024-07-30 NOTE — Progress Notes (Signed)
 Cardiology Office Note:    Date:  07/30/2024   ID:  [name], DOB 1950/09/24, MRN [id number]  PCP:  Erik Lot, PA-C  Cardiologist:  Erik JONELLE Crape, MD   Referring MD: Erik Lot, PA-C    ASSESSMENT:    1. Mixed hyperlipidemia   2. Atherosclerosis of native coronary artery of native heart without angina pectoris   3. Essential hypertension   4. S/P CABG x 4   5. Tobacco abuse    PLAN:    In order of problems listed above:  Coronary artery disease: Patient appears to be stable from a clinical standpoint.  Overall he leads a sedentary lifestyle.  I discussed about ambulation and exercise. Mixed dyslipidemia: On lipid-lowering medications followed by primary care.  Goal LDL should be less than 60. Cigarette smoker: I spent 5 minutes with the patient discussing solely about smoking. Smoking cessation was counseled. I suggested to the patient also different medications and pharmacological interventions. Patient is keen to try stopping on its own at this time. He will get back to me if he needs any further assistance in this matter. 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 Patient will be seen in follow-up appointment in 6 months or earlier if the patient has any concerns.    Medication Adjustments/Labs and Tests Ordered: Current medicines are reviewed at length with the patient today.  Concerns regarding medicines are outlined above.  Orders Placed This Encounter  Procedures   EKG 12-Lead   No orders of the defined types were placed in this encounter.    History of Present Illness:    Erik Alvarado is a 73 y.o. male who is being seen today for the evaluation of to be established for coronary artery disease at the request of Erik Alvarado, [place].  Patient has past medical history of coronary artery disease post CABG surgery, mixed dyslipidemia.  Unfortunately he has resumed smoking.  He is urothelial cancer and is treated for this.  At the time of my  evaluation, the patient is alert awake oriented and in no distress.  He denies any chest pain orthopnea or PND.  Past Medical History:  Diagnosis Date   Allergic rhinitis    Anemia    hx of anemia after CABG- pt unsure about this   Anxiety    Arthritis    Bladder cancer (HCC)    a. 12/2012 s/p resection and outpt chemotherapy   Blood transfusion without reported diagnosis    BPH (benign prostatic hyperplasia) 01/01/2023   Bruit 04/27/2014   CAD (coronary artery disease)    a. s/p multiple PCI's in [place] dating back to 1996 w/ ISR in RCA req B radiation @ one point;  b. 06/2004 reports PCI @ [facility] (nothing in Epic);  c. 2010 PCI in [place] (prev saw Dr. Vena);  d. 12/2012 Neg Cardiolite in [place] but req eventual CABG in 2014 with LIMA-LAD, seq SVG-OM1-OM2, SVG-PDA.   Chest pain with moderate risk of acute coronary syndrome 08/08/2013   Chronic pain    a. followed by pain management in [place].   Closed nondisplaced fracture of distal phalanx of right thumb 09/06/2017   Clotting disorder    clot at one of his stent sites per pt   Colon polyp    Congestive heart failure (CHF) (HCC)    COPD (chronic obstructive pulmonary disease) (HCC)    Coronary atherosclerosis of native coronary artery 08/13/2013   Dizziness 06/09/2016   Dyspnea on exertion 06/09/2016   Essential hypertension 09/05/2013  Fatigue    Former tobacco use    GERD (gastroesophageal reflux disease)    hx of years ago    Hydrocele    a. s/p resection   Hyperlipidemia    a. prev did not tolerate high dose atorvastatin .   Hypertension    Intermediate coronary syndrome (HCC) 08/08/2013   Laceration of left thumb without foreign body without damage to nail 09/06/2017   Myocardial infarction (HCC)    hx of MI x 4    Neuropathy    NSTEMI (non-ST elevated myocardial infarction) (HCC) 03/17/2022   Osteoarthritis    a. neck/back   Right bundle branch block    S/P CABG x 4 08/10/2013   Screening for diabetes  mellitus (DM) 01/01/2023   Sleep apnea    has CPAP = setting at 7- not wearing at this time 10-10-18   Spinal stenosis 05/17/2022   Status post coronary artery stent placement    Tobacco abuse 09/05/2013   Urothelial carcinoma [facility])     Past Surgical History:  Procedure Laterality Date   APPENDECTOMY     beta catheterization      irradiated coronary artery- 15-20 years ago    BLADDER SURGERY     Dr Erik Alvarado x2   C6-7 Fracture/repair     CARDIAC CATHETERIZATION     cardiac stents      COLONOSCOPY      [facility] [place]    CORONARY ANGIOPLASTY     CORONARY ARTERY BYPASS GRAFT N/A 08/10/2013   Procedure: CORONARY ARTERY BYPASS GRAFTING (CABG) X 4 using left internal mammary artery and bilateral saphenous vein;  Surgeon: Maude Fleeta Ochoa, MD;  Location: [facility] OR;  Service: Open Heart Surgery;  Laterality: N/A;   CORONARY STENT INTERVENTION N/A 03/20/2022   Procedure: CORONARY STENT INTERVENTION;  Surgeon: Verlin Lonni BIRCH, MD;  Location: [facility] INVASIVE CV LAB;  Service: Cardiovascular;  Laterality: N/A;   CORONARY/GRAFT ACUTE MI REVASCULARIZATION N/A 03/17/2022   Procedure: Coronary/Graft Acute MI Revascularization;  Surgeon: Claudene Victory ORN, MD;  Location: [facility] INVASIVE CV LAB;  Service: Cardiovascular;  Laterality: N/A;   ESOPHAGOGASTRODUODENOSCOPY     [place]    KNEE CARTILAGE SURGERY     right knee    LEFT HEART CATH AND CORS/GRAFTS ANGIOGRAPHY N/A 03/17/2022   Procedure: LEFT HEART CATH AND CORS/GRAFTS ANGIOGRAPHY;  Surgeon: Claudene Victory ORN, MD;  Location: [facility] INVASIVE CV LAB;  Service: Cardiovascular;  Laterality: N/A;   LEFT HEART CATHETERIZATION WITH CORONARY ANGIOGRAM N/A 08/08/2013   Procedure: LEFT HEART CATHETERIZATION WITH CORONARY ANGIOGRAM;  Surgeon: Ozell BIRCH Fell, MD;  Location: [facility] CATH LAB;  Service: Cardiovascular;  Laterality: N/A;   MOUTH SURGERY     MULTIPLE EXTRACTIONS WITH ALVEOLOPLASTY N/A 09/19/2013   Procedure: MULTIPLE EXTRACTION WITH  ALVEOLOPLASTY AND PRE-PROSTHETIC SURGERY ;  Surgeon: Tanda JULIANNA Fanny, DDS;  Location: [facility] ORS;  Service: Oral Surgery;  Laterality: N/A;   Resection of bladder cancer     a. 12/2012 followed by chemo   Resection of hydrocele     TEE WITHOUT CARDIOVERSION N/A 08/10/2013   Procedure: TRANSESOPHAGEAL ECHOCARDIOGRAM (TEE);  Surgeon: Maude Fleeta Ochoa, MD;  Location: [facility] OR;  Service: Open Heart Surgery;  Laterality: N/A;  intra- operative   UPPER GASTROINTESTINAL ENDOSCOPY      Current Medications: Current Meds  Medication Sig   albuterol  (VENTOLIN  HFA) 108 (90 Base) MCG/ACT inhaler Inhale 2 puffs into the lungs every 6 (six) hours as needed for wheezing or shortness of breath.  aspirin  EC 81 MG tablet Take 81 mg by mouth daily.   Budeson-Glycopyrrol-Formoterol  (BREZTRI  AEROSPHERE) 160-9-4.8 MCG/ACT AERO Inhale 2 puffs into the lungs 2 (two) times daily.   docusate sodium  (COLACE) 100 MG capsule Take 100 mg by mouth as needed. Equate Stool softener brand as needed  Patient last dose approximately 09/11/2013.   Evolocumab (REPATHA SURECLICK) 140 MG/ML SOAJ Inject 140 mg into the skin every 14 (fourteen) days.   fluticasone  (FLONASE ) 50 MCG/ACT nasal spray Place 1 spray into both nostrils daily. (Patient taking differently: Place 1 spray into both nostrils as needed.)   hydrOXYzine (ATARAX) 25 MG tablet Take 25 mg by mouth 4 (four) times daily as needed.   nitroGLYCERIN  (NITROSTAT ) 0.4 MG SL tablet Place 1 tablet (0.4 mg total) under the tongue every 5 (five) minutes x 3 doses as needed for chest pain.   ondansetron  (ZOFRAN -ODT) 4 MG disintegrating tablet Take 1 tablet (4 mg total) by mouth every 6 (six) hours as needed for nausea or vomiting.   Oxycodone  HCl 10 MG TABS Take 10 mg by mouth 4 (four) times daily as needed.   [DISCONTINUED] clopidogrel  (PLAVIX ) 75 MG tablet Take 1 tablet (75 mg total) by mouth daily.   [DISCONTINUED] tamsulosin  (FLOMAX ) 0.4 MG CAPS capsule Take 0.4 mg by mouth daily.      Allergies:   Other, Aloe, Amlodipine , Butalbital-acetaminophen , Farxiga  [dapagliflozin ], Statins, Tamsulosin , Amoxicillin, and Cephalexin   Social History   Socioeconomic History   Marital status: Divorced    Spouse name: Not on file   Number of children: 4   Years of education: Not on file   Highest education level: Not on file  Occupational History   Occupation: Disabled  Tobacco Use   Smoking status: Every Day    Current packs/day: 0.00    Average packs/day: 1 pack/day for 20.0 years (20.0 ttl pk-yrs)    Types: Cigarettes    Start date: 08/07/1993    Last attempt to quit: 08/07/2013    Years since quitting: 10.9   Smokeless tobacco: Never  Vaping Use   Vaping status: Never Used  Substance and Sexual Activity   Alcohol  use: No    Comment: patient states that he hasn't had a beer in 47 years   Drug use: No   Sexual activity: Yes  Other Topics Concern   Not on file  Social History Narrative   05/31/15:   Lives in [place]. Patient is divorced. Retired/disabled. Does not routinely exercise.   Social Drivers of Corporate Investment Banker Strain: Not on file  Food Insecurity: No Food Insecurity (08/24/2023)   Hunger Vital Sign    Worried About Running Out of Food in the Last Year: Never true    Ran Out of Food in the Last Year: Never true  Transportation Needs: No Transportation Needs (08/24/2023)   PRAPARE - Administrator, Civil Service (Medical): No    Lack of Transportation (Non-Medical): No  Physical Activity: Not on file  Stress: Not on file  Social Connections: Not on file     Family History: The patient's family history includes CAD in his father; Cancer in his mother; Other in his sister. There is no history of Esophageal cancer, Rectal cancer, Stomach cancer, or Colon cancer.  ROS:   Please see the history of present illness.    All other systems reviewed and are negative.  EKGs/Labs/Other Studies Reviewed:    The following studies were  reviewed today:  EKG Interpretation Date/Time:  Wednesday July 30 2024 15:48:43 EST Ventricular Rate:  80 PR Interval:  148 QRS Duration:  136 QT Interval:  392 QTC Calculation: 452 R Axis:   -15  Text Interpretation: Normal sinus rhythm with sinus arrhythmia Right bundle branch block Abnormal ECG When compared with ECG of 26-Jun-2023 10:18, PR interval has increased Confirmed by Edwyna Backers [id number]) on 07/30/2024 3:56:15 PM     Recent Labs: 07/04/2024: TSH 2.050 07/25/2024: ALT 12; BUN 17; Creatinine 0.96; Hemoglobin 14.7; Platelet Count 190; Potassium 3.7; Sodium 142  Recent Lipid Panel    Component Value Date/Time   CHOL 125 06/26/2023 1106   TRIG 57 06/26/2023 1106   HDL 40 06/26/2023 1106   CHOLHDL 3.1 06/26/2023 1106   CHOLHDL 6.8 03/18/2022 0327   VLDL 36 03/18/2022 0327   LDLCALC 73 06/26/2023 1106    Physical Exam:    VS:  BP 138/70   Pulse 80   Ht 6' (1.829 m)   Wt 153 lb 9.6 oz (69.7 kg)   SpO2 94%   BMI 20.83 kg/m     Wt Readings from Last 3 Encounters:  07/30/24 153 lb 9.6 oz (69.7 kg)  07/25/24 155 lb 12.8 oz (70.7 kg)  07/04/24 154 lb 14.4 oz (70.3 kg)     GEN: Patient is in no acute distress HEENT: Normal NECK: No JVD; No carotid bruits LYMPHATICS: No lymphadenopathy CARDIAC: S1 S2 regular, 2/6 systolic murmur at the apex. RESPIRATORY:  Clear to auscultation without rales, wheezing or rhonchi  ABDOMEN: Soft, non-tender, non-distended MUSCULOSKELETAL:  No edema; No deformity  SKIN: Warm and dry NEUROLOGIC:  Alert and oriented x 3 PSYCHIATRIC:  Normal affect    Signed, Backers JONELLE Edwyna, MD  07/30/2024 4:05 PM    [facility]

## 2024-07-30 NOTE — Patient Instructions (Signed)
 Medication Instructions:  Your physician recommends that you continue on your current medications as directed. Please refer to the Current Medication list given to you today.  *If you need a refill on your cardiac medications before your next appointment, please call your pharmacy*   Lab Work: None ordered If you have labs (blood work) drawn today and your tests are completely normal, you will receive your results only by: MyChart Message (if you have MyChart) OR A paper copy in the mail If you have any lab test that is abnormal or we need to change your treatment, we will call you to review the results.  Testing/Procedures: Your physician has requested that you have an echocardiogram. Echocardiography is a painless test that uses sound waves to create images of your heart. It provides your doctor with information about the size and shape of your heart and how well your heart's chambers and valves are working. This procedure takes approximately one hour. There are no restrictions for this procedure. Please do NOT wear cologne, perfume, aftershave, or lotions (deodorant is allowed). Please arrive 15 minutes prior to your appointment time.  Please note: We ask at that you not bring children with you during ultrasound (echo/ vascular) testing. Due to room size and safety concerns, children are not allowed in the ultrasound rooms during exams. Our front office staff cannot provide observation of children in our lobby area while testing is being conducted. An adult accompanying a patient to their appointment will only be allowed in the ultrasound room at the discretion of the ultrasound technician under special circumstances. We apologize for any inconvenience.  Follow-Up: At [facility], you and your health needs are our priority.  As part of our continuing mission to provide you with exceptional heart care, we have created designated Provider Care Teams.  These Care Teams include your primary  Cardiologist (physician) and Advanced Practice Providers (APPs -  Physician Assistants and Nurse Practitioners) who all work together to provide you with the care you need, when you need it.  We recommend signing up for the patient portal called MyChart.  Sign up information is provided on this After Visit Summary.  MyChart is used to connect with patients for Virtual Visits (Telemedicine).  Patients are able to view lab/test results, encounter notes, upcoming appointments, etc.  Non-urgent messages can be sent to your provider as well.   To learn more about what you can do with MyChart, go to [email or web address].    Your next appointment:   9 month(s)  The format for your next appointment:   In Person  Provider:   Jennifer Crape, MD   Other Instructions Echocardiogram An echocardiogram is a test that uses sound waves (ultrasound) to produce images of the heart. Images from an echocardiogram can provide important information about: Heart size and shape. The size and thickness and movement of your heart's walls. Heart muscle function and strength. Heart valve function or if you have stenosis. Stenosis is when the heart valves are too narrow. If blood is flowing backward through the heart valves (regurgitation). A tumor or infectious growth around the heart valves. Areas of heart muscle that are not working well because of poor blood flow or injury from a heart attack. Aneurysm detection. An aneurysm is a weak or damaged part of an artery wall. The wall bulges out from the normal force of blood pumping through the body. Tell a health care provider about: Any allergies you have. All medicines you are taking, including vitamins, herbs,  eye drops, creams, and over-the-counter medicines. Any blood disorders you have. Any surgeries you have had. Any medical conditions you have. Whether you are pregnant or may be pregnant. What are the risks? Generally, this is a safe test. However,  problems may occur, including an allergic reaction to dye (contrast) that may be used during the test. What happens before the test? No specific preparation is needed. You may eat and drink normally. What happens during the test? You will take off your clothes from the waist up and put on a hospital gown. Electrodes or electrocardiogram (ECG)patches may be placed on your chest. The electrodes or patches are then connected to a device that monitors your heart rate and rhythm. You will lie down on a table for an ultrasound exam. A gel will be applied to your chest to help sound waves pass through your skin. A handheld device, called a transducer, will be pressed against your chest and moved over your heart. The transducer produces sound waves that travel to your heart and bounce back (or echo back) to the transducer. These sound waves will be captured in real-time and changed into images of your heart that can be viewed on a video monitor. The images will be recorded on a computer and reviewed by your health care provider. You may be asked to change positions or hold your breath for a short time. This makes it easier to get different views or better views of your heart. In some cases, you may receive contrast through an IV in one of your veins. This can improve the quality of the pictures from your heart. The procedure may vary among health care providers and hospitals.   What can I expect after the test? You may return to your normal, everyday life, including diet, activities, and medicines, unless your health care provider tells you not to do that. Follow these instructions at home: It is up to you to get the results of your test. Ask your health care provider, or the department that is doing the test, when your results will be ready. Keep all follow-up visits. This is important. Summary An echocardiogram is a test that uses sound waves (ultrasound) to produce images of the heart. Images from an  echocardiogram can provide important information about the size and shape of your heart, heart muscle function, heart valve function, and other possible heart problems. You do not need to do anything to prepare before this test. You may eat and drink normally. After the echocardiogram is completed, you may return to your normal, everyday life, unless your health care provider tells you not to do that. This information is not intended to replace advice given to you by your health care provider. Make sure you discuss any questions you have with your health care provider. Document Revised: 04/20/2020 Document Reviewed: 04/20/2020 Elsevier Patient Education  2021 Elsevier Inc.   Important Information About Sugar

## 2024-07-31 ENCOUNTER — Other Ambulatory Visit: Payer: Self-pay

## 2024-08-14 NOTE — Progress Notes (Unsigned)
 [facility] [address] [place],  [place]  [address] [phone]  Clinic Day:  08/15/2024  Referring physician: Montey Lot, PA-C   HISTORY OF PRESENT ILLNESS:  The patient is a 73 y.o. 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  He comes in today to be evaluated before heading into his 17th cycle of pembrolizumab .  The patient continues to have significant pruritus that has not improved after various creams and pills.  As it pertains to his disease, he denies having hematuria, increased urinary frequency, or other urinary symptoms which concern him for overt signs of disease progression.   This patient's bladder cancer history dates back to at least 2014.  Over time, he has had multiple bladder tumor resections.  He has also been exposed to BCG for his previously early-stage bladder cancers.  As his disease has returned and is now high-grade in nature, he is now on pembrolizumab  for his disease management.  PHYSICAL EXAM:  Blood pressure 118/67, pulse 62, temperature 97.9 F (36.6 C), temperature source Oral, resp. rate 16, height 6' (1.829 m), weight 155 lb (70.3 kg), SpO2 96%. Wt Readings from Last 3 Encounters:  08/15/24 155 lb (70.3 kg)  07/30/24 153 lb 9.6 oz (69.7 kg)  07/25/24 155 lb 12.8 oz (70.7 kg)   Body mass index is 21.02 kg/m. Performance status (ECOG): 1 - Symptomatic but completely ambulatory Physical Exam Constitutional:      Appearance: Normal appearance. He is not ill-appearing.  HENT:     Mouth/Throat:     Mouth: Mucous membranes are moist.     Pharynx: Oropharynx is clear. No oropharyngeal exudate or posterior oropharyngeal erythema.  Cardiovascular:     Rate and Rhythm: Normal rate and regular rhythm.     Heart sounds: No murmur heard.    No friction rub. No gallop.  Pulmonary:     Effort: Pulmonary effort is normal. No respiratory distress.     Breath sounds: Normal breath sounds. No wheezing, rhonchi or  rales.  Abdominal:     General: Bowel sounds are normal. There is no distension.     Palpations: Abdomen is soft. There is no mass.     Tenderness: There is no abdominal tenderness.  Musculoskeletal:        General: No swelling.     Right lower leg: No edema.     Left lower leg: No edema.  Lymphadenopathy:     Cervical: No cervical adenopathy.     Upper Body:     Right upper body: No supraclavicular or axillary adenopathy.     Left upper body: No supraclavicular or axillary adenopathy.     Lower Body: No right inguinal adenopathy. No left inguinal adenopathy.  Skin:    General: Skin is warm.     Coloration: Skin is not jaundiced.     Findings: Rash (diffuse rash on his back) present. No lesion.  Neurological:     General: No focal deficit present.     Mental Status: He is alert and oriented to person, place, and time. Mental status is at baseline.  Psychiatric:        Mood and Affect: Mood normal.        Behavior: Behavior normal.        Thought Content: Thought content normal.    LABS:      Latest Ref Rng & Units 08/15/2024    9:15 AM 07/25/2024    9:17 AM 07/04/2024    9:07  AM  CBC  WBC 4.0 - 10.5 K/uL 7.6  6.7  8.6   Hemoglobin 13.0 - 17.0 g/dL 84.9  85.2  84.1   Hematocrit 39.0 - 52.0 % 44.5  43.8  46.8   Platelets 150 - 400 K/uL 191  190  178       Latest Ref Rng & Units 08/15/2024    9:15 AM 07/25/2024    9:17 AM 07/04/2024    9:07 AM  CMP  Glucose 70 - 99 mg/dL 892  881  857   BUN 8 - 23 mg/dL 12  17  12    Creatinine 0.61 - 1.24 mg/dL 9.02  9.03  8.96   Sodium 135 - 145 mmol/L 141  142  140   Potassium 3.5 - 5.1 mmol/L 4.0  3.7  4.2   Chloride 98 - 111 mmol/L 105  107  103   CO2 22 - 32 mmol/L 27  25  28    Calcium  8.9 - 10.3 mg/dL 9.1  9.0  9.4   Total Protein 6.5 - 8.1 g/dL 6.8  7.5  7.1   Total Bilirubin 0.0 - 1.2 mg/dL 0.7  0.5  0.8   Alkaline Phos 38 - 126 U/L 66  63  66   AST 15 - 41 U/L 21  24  20    ALT 0 - 44 U/L 10  12  12     ASSESSMENT &  PLAN:  Assessment/Plan:  A 73 y.o. male with recurrent high-grade, but superficial, bladder cancer.  Once again, significant pruritus remains an issue.  He will receive Pepcid  40 mg IV and Decadron  4 mg IV today.  Furthermore, I will prescribe prednisone  5 mg, which he will take for 5 days.  He also knows to use diphenhydramine  25 mg every 6 hours as needed.  His 17th cycle of pembrolizumab  will be delayed for 1 week.  I will see him back in 4 weeks before he heads into his 18th and final cycle of pembrolizumab .  The patient understands all the plans discussed today and is in agreement with them.   Tim Wilhide DELENA Kerns, MD

## 2024-08-15 ENCOUNTER — Inpatient Hospital Stay: Attending: Oncology

## 2024-08-15 ENCOUNTER — Inpatient Hospital Stay: Attending: Oncology | Admitting: Oncology

## 2024-08-15 ENCOUNTER — Inpatient Hospital Stay

## 2024-08-15 ENCOUNTER — Encounter: Payer: Self-pay | Admitting: Oncology

## 2024-08-15 ENCOUNTER — Other Ambulatory Visit: Payer: Self-pay | Admitting: Oncology

## 2024-08-15 VITALS — BP 118/67 | HR 62 | Temp 97.9°F | Resp 16 | Ht 72.0 in | Wt 155.0 lb

## 2024-08-15 DIAGNOSIS — L299 Pruritus, unspecified: Secondary | ICD-10-CM | POA: Insufficient documentation

## 2024-08-15 DIAGNOSIS — C671 Malignant neoplasm of dome of bladder: Secondary | ICD-10-CM

## 2024-08-15 DIAGNOSIS — C679 Malignant neoplasm of bladder, unspecified: Secondary | ICD-10-CM | POA: Diagnosis not present

## 2024-08-15 DIAGNOSIS — Z5112 Encounter for antineoplastic immunotherapy: Secondary | ICD-10-CM | POA: Diagnosis present

## 2024-08-15 LAB — CBC WITH DIFFERENTIAL (CANCER CENTER ONLY)
Abs Immature Granulocytes: 0.01 K/uL (ref 0.00–0.07)
Basophils Absolute: 0.1 K/uL (ref 0.0–0.1)
Basophils Relative: 2 %
Eosinophils Absolute: 0.7 K/uL — ABNORMAL HIGH (ref 0.0–0.5)
Eosinophils Relative: 9 %
HCT: 44.5 % (ref 39.0–52.0)
Hemoglobin: 15 g/dL (ref 13.0–17.0)
Immature Granulocytes: 0 %
Lymphocytes Relative: 28 %
Lymphs Abs: 2.1 K/uL (ref 0.7–4.0)
MCH: 32.8 pg (ref 26.0–34.0)
MCHC: 33.7 g/dL (ref 30.0–36.0)
MCV: 97.2 fL (ref 80.0–100.0)
Monocytes Absolute: 0.6 K/uL (ref 0.1–1.0)
Monocytes Relative: 9 %
Neutro Abs: 4 K/uL (ref 1.7–7.7)
Neutrophils Relative %: 52 %
Platelet Count: 191 K/uL (ref 150–400)
RBC: 4.58 MIL/uL (ref 4.22–5.81)
RDW: 12.6 % (ref 11.5–15.5)
WBC Count: 7.6 K/uL (ref 4.0–10.5)
nRBC: 0 % (ref 0.0–0.2)

## 2024-08-15 LAB — CMP (CANCER CENTER ONLY)
ALT: 10 U/L (ref 0–44)
AST: 21 U/L (ref 15–41)
Albumin: 4.1 g/dL (ref 3.5–5.0)
Alkaline Phosphatase: 66 U/L (ref 38–126)
Anion gap: 9 (ref 5–15)
BUN: 12 mg/dL (ref 8–23)
CO2: 27 mmol/L (ref 22–32)
Calcium: 9.1 mg/dL (ref 8.9–10.3)
Chloride: 105 mmol/L (ref 98–111)
Creatinine: 0.97 mg/dL (ref 0.61–1.24)
GFR, Estimated: >60 mL/min (ref >60)
Glucose, Bld: 107 mg/dL — ABNORMAL HIGH (ref 70–99)
Potassium: 4 mmol/L (ref 3.5–5.1)
Sodium: 141 mmol/L (ref 135–145)
Total Bilirubin: 0.7 mg/dL (ref 0.0–1.2)
Total Protein: 6.8 g/dL (ref 6.5–8.1)

## 2024-08-15 MED ORDER — PREDNISONE 5 MG PO TABS: 5.0000 mg | ORAL_TABLET | Freq: Every day | ORAL | 0 refills | Status: AC

## 2024-08-15 MED ORDER — DEXAMETHASONE SOD PHOSPHATE PF 10 MG/ML IJ SOLN
4.0000 mg | Freq: Once | INTRAMUSCULAR | Status: AC
  Administered 2024-08-15: 4 mg via INTRAVENOUS

## 2024-08-15 MED ORDER — FAMOTIDINE IN NACL 20-0.9 MG/50ML-% IV SOLN
20.0000 mg | Freq: Once | INTRAVENOUS | Status: AC
  Administered 2024-08-15: 20 mg via INTRAVENOUS
  Filled 2024-08-15: qty 50

## 2024-08-15 MED ORDER — SODIUM CHLORIDE 0.9 % IV SOLN: Freq: Once | INTRAVENOUS | Status: AC

## 2024-08-15 NOTE — Progress Notes (Signed)
 PATIENT C/O UNBEARABLE ITCHING OVER ENTIRE BODY.

## 2024-08-17 ENCOUNTER — Other Ambulatory Visit: Payer: Self-pay

## 2024-08-22 ENCOUNTER — Inpatient Hospital Stay

## 2024-08-22 ENCOUNTER — Other Ambulatory Visit: Payer: Self-pay | Admitting: Hematology and Oncology

## 2024-08-22 VITALS — BP 120/67 | HR 65 | Temp 98.0°F | Resp 18 | Wt 154.0 lb

## 2024-08-22 DIAGNOSIS — Z5112 Encounter for antineoplastic immunotherapy: Secondary | ICD-10-CM | POA: Diagnosis not present

## 2024-08-22 DIAGNOSIS — C679 Malignant neoplasm of bladder, unspecified: Secondary | ICD-10-CM

## 2024-08-22 LAB — CMP (CANCER CENTER ONLY)
ALT: 10 U/L (ref 0–44)
AST: 19 U/L (ref 15–41)
Albumin: 4.1 g/dL (ref 3.5–5.0)
Alkaline Phosphatase: 63 U/L (ref 38–126)
Anion gap: 10 (ref 5–15)
BUN: 16 mg/dL (ref 8–23)
CO2: 26 mmol/L (ref 22–32)
Calcium: 9.1 mg/dL (ref 8.9–10.3)
Chloride: 105 mmol/L (ref 98–111)
Creatinine: 0.98 mg/dL (ref 0.61–1.24)
GFR, Estimated: >60 mL/min (ref >60)
Glucose, Bld: 152 mg/dL — ABNORMAL HIGH (ref 70–99)
Potassium: 3.7 mmol/L (ref 3.5–5.1)
Sodium: 141 mmol/L (ref 135–145)
Total Bilirubin: 0.7 mg/dL (ref 0.0–1.2)
Total Protein: 6.5 g/dL (ref 6.5–8.1)

## 2024-08-22 LAB — CBC WITH DIFFERENTIAL (CANCER CENTER ONLY)
Abs Immature Granulocytes: 0.02 K/uL (ref 0.00–0.07)
Basophils Absolute: 0.1 K/uL (ref 0.0–0.1)
Basophils Relative: 1 %
Eosinophils Absolute: 0.3 K/uL (ref 0.0–0.5)
Eosinophils Relative: 3 %
HCT: 43.1 % (ref 39.0–52.0)
Hemoglobin: 14.6 g/dL (ref 13.0–17.0)
Immature Granulocytes: 0 %
Lymphocytes Relative: 13 %
Lymphs Abs: 1.1 K/uL (ref 0.7–4.0)
MCH: 32.7 pg (ref 26.0–34.0)
MCHC: 33.9 g/dL (ref 30.0–36.0)
MCV: 96.4 fL (ref 80.0–100.0)
Monocytes Absolute: 0.3 K/uL (ref 0.1–1.0)
Monocytes Relative: 4 %
Neutro Abs: 6.6 K/uL (ref 1.7–7.7)
Neutrophils Relative %: 79 %
Platelet Count: 181 K/uL (ref 150–400)
RBC: 4.47 MIL/uL (ref 4.22–5.81)
RDW: 12.4 % (ref 11.5–15.5)
WBC Count: 8.4 K/uL (ref 4.0–10.5)
nRBC: 0 % (ref 0.0–0.2)

## 2024-08-22 MED ORDER — DIPHENHYDRAMINE HCL 50 MG/ML IJ SOLN
25.0000 mg | Freq: Once | INTRAMUSCULAR | Status: AC
  Administered 2024-08-22: 25 mg via INTRAVENOUS
  Filled 2024-08-22: qty 1

## 2024-08-22 MED ORDER — FAMOTIDINE IN NACL 20-0.9 MG/50ML-% IV SOLN
20.0000 mg | Freq: Once | INTRAVENOUS | Status: DC
  Filled 2024-08-22: qty 50

## 2024-08-22 MED ORDER — SODIUM CHLORIDE 0.9 % IV SOLN
200.0000 mg | Freq: Once | INTRAVENOUS | Status: AC
  Administered 2024-08-22: 200 mg via INTRAVENOUS
  Filled 2024-08-22: qty 200

## 2024-08-22 MED ORDER — FAMOTIDINE IN NACL 20-0.9 MG/50ML-% IV SOLN
20.0000 mg | Freq: Once | INTRAVENOUS | Status: AC
  Administered 2024-08-22: 20 mg via INTRAVENOUS
  Filled 2024-08-22: qty 50

## 2024-08-22 MED ORDER — SODIUM CHLORIDE 0.9 % IV SOLN: INTRAVENOUS | Status: DC

## 2024-08-22 NOTE — Patient Instructions (Signed)

## 2024-08-25 DIAGNOSIS — M545 Low back pain, unspecified: Secondary | ICD-10-CM | POA: Diagnosis not present

## 2024-08-25 DIAGNOSIS — M1711 Unilateral primary osteoarthritis, right knee: Secondary | ICD-10-CM | POA: Diagnosis not present

## 2024-08-25 DIAGNOSIS — M1712 Unilateral primary osteoarthritis, left knee: Secondary | ICD-10-CM | POA: Diagnosis not present

## 2024-08-26 ENCOUNTER — Ambulatory Visit: Attending: Cardiology

## 2024-09-12 ENCOUNTER — Inpatient Hospital Stay: Attending: Oncology

## 2024-09-12 ENCOUNTER — Encounter: Payer: Self-pay | Admitting: Hematology and Oncology

## 2024-09-12 ENCOUNTER — Inpatient Hospital Stay: Attending: Oncology | Admitting: Hematology and Oncology

## 2024-09-12 ENCOUNTER — Inpatient Hospital Stay

## 2024-09-12 VITALS — BP 130/58 | HR 55 | Temp 97.5°F | Resp 16 | Ht 72.0 in | Wt 154.7 lb

## 2024-09-12 DIAGNOSIS — Z79620 Long term (current) use of immunosuppressive biologic: Secondary | ICD-10-CM | POA: Diagnosis not present

## 2024-09-12 DIAGNOSIS — L299 Pruritus, unspecified: Secondary | ICD-10-CM | POA: Diagnosis not present

## 2024-09-12 DIAGNOSIS — C679 Malignant neoplasm of bladder, unspecified: Secondary | ICD-10-CM

## 2024-09-12 DIAGNOSIS — Z5112 Encounter for antineoplastic immunotherapy: Secondary | ICD-10-CM | POA: Diagnosis present

## 2024-09-12 DIAGNOSIS — C678 Malignant neoplasm of overlapping sites of bladder: Secondary | ICD-10-CM

## 2024-09-12 LAB — CBC WITH DIFFERENTIAL (CANCER CENTER ONLY)
Abs Immature Granulocytes: 0.02 K/uL (ref 0.00–0.07)
Basophils Absolute: 0.1 K/uL (ref 0.0–0.1)
Basophils Relative: 1 %
Eosinophils Absolute: 1.1 K/uL — ABNORMAL HIGH (ref 0.0–0.5)
Eosinophils Relative: 14 %
HCT: 41 % (ref 39.0–52.0)
Hemoglobin: 14 g/dL (ref 13.0–17.0)
Immature Granulocytes: 0 %
Lymphocytes Relative: 21 %
Lymphs Abs: 1.7 K/uL (ref 0.7–4.0)
MCH: 32.7 pg (ref 26.0–34.0)
MCHC: 34.1 g/dL (ref 30.0–36.0)
MCV: 95.8 fL (ref 80.0–100.0)
Monocytes Absolute: 0.7 K/uL (ref 0.1–1.0)
Monocytes Relative: 9 %
Neutro Abs: 4.5 K/uL (ref 1.7–7.7)
Neutrophils Relative %: 55 %
Platelet Count: 193 K/uL (ref 150–400)
RBC: 4.28 MIL/uL (ref 4.22–5.81)
RDW: 12.7 % (ref 11.5–15.5)
WBC Count: 8.2 K/uL (ref 4.0–10.5)
nRBC: 0 % (ref 0.0–0.2)

## 2024-09-12 LAB — CMP (CANCER CENTER ONLY)
ALT: 7 U/L (ref 0–44)
AST: 18 U/L (ref 15–41)
Albumin: 4.2 g/dL (ref 3.5–5.0)
Alkaline Phosphatase: 65 U/L (ref 38–126)
Anion gap: 7 (ref 5–15)
BUN: 16 mg/dL (ref 8–23)
CO2: 27 mmol/L (ref 22–32)
Calcium: 9.4 mg/dL (ref 8.9–10.3)
Chloride: 108 mmol/L (ref 98–111)
Creatinine: 0.91 mg/dL (ref 0.61–1.24)
GFR, Estimated: >60 mL/min
Glucose, Bld: 96 mg/dL (ref 70–99)
Potassium: 3.7 mmol/L (ref 3.5–5.1)
Sodium: 142 mmol/L (ref 135–145)
Total Bilirubin: 0.5 mg/dL (ref 0.0–1.2)
Total Protein: 6.3 g/dL — ABNORMAL LOW (ref 6.5–8.1)

## 2024-09-12 LAB — TSH: TSH: 1.82 u[IU]/mL (ref 0.350–4.500)

## 2024-09-12 MED ORDER — SODIUM CHLORIDE 0.9 % IV SOLN: INTRAVENOUS | Status: DC

## 2024-09-12 MED ORDER — SODIUM CHLORIDE 0.9 % IV SOLN
200.0000 mg | Freq: Once | INTRAVENOUS | Status: AC
  Administered 2024-09-12: 200 mg via INTRAVENOUS
  Filled 2024-09-12: qty 8

## 2024-09-12 MED ORDER — FAMOTIDINE IN NACL 20-0.9 MG/50ML-% IV SOLN
20.0000 mg | Freq: Once | INTRAVENOUS | Status: AC
  Administered 2024-09-12: 20 mg via INTRAVENOUS
  Filled 2024-09-12: qty 50

## 2024-09-12 MED ORDER — DIPHENHYDRAMINE HCL 50 MG/ML IJ SOLN
25.0000 mg | Freq: Once | INTRAMUSCULAR | Status: AC
  Administered 2024-09-12: 25 mg via INTRAVENOUS
  Filled 2024-09-12: qty 1

## 2024-09-12 NOTE — Patient Instructions (Signed)
 " Pembrolizumab  Injection What is this medication? PEMBROLIZUMAB  (PEM broe LIZ ue mab) treats some types of cancer. It works by helping your immune system slow or stop the spread of cancer cells. It is a monoclonal antibody. This medicine may be used for other purposes; ask your health care provider or pharmacist if you have questions. COMMON BRAND NAME(S): Keytruda  What should I tell my care team before I take this medication? They need to know if you have any of these conditions: Allogeneic stem cell transplant (uses someone else's stem cells) Autoimmune diseases, such as Crohn disease, ulcerative colitis, lupus History of chest radiation Nervous system problems, such as Guillain-Barre syndrome, myasthenia gravis Organ transplant An unusual or allergic reaction to pembrolizumab , other medications, foods, dyes, or preservatives Pregnant or trying to get pregnant Breast-feeding How should I use this medication? This medication is injected into a vein. It is given by your care team in a hospital or clinic setting. A special MedGuide will be given to you before each treatment. Be sure to read this information carefully each time. Talk to your care team about the use of this medication in children. While it may be prescribed for children as young as 6 months for selected conditions, precautions do apply. Overdosage: If you think you have taken too much of this medicine contact a poison control center or emergency room at once. NOTE: This medicine is only for you. Do not share this medicine with others. What if I miss a dose? Keep appointments for follow-up doses. It is important not to miss your dose. Call your care team if you are unable to keep an appointment. What may interact with this medication? Interactions have not been studied. This list may not describe all possible interactions. Give your health care provider a list of all the medicines, herbs, non-prescription drugs, or dietary  supplements you use. Also tell them if you smoke, drink alcohol , or use illegal drugs. Some items may interact with your medicine. What should I watch for while using this medication? Your condition will be monitored carefully while you are receiving this medication. You may need blood work while taking this medication. This medication may cause serious skin reactions. They can happen weeks to months after starting the medication. Contact your care team right away if you notice fevers or flu-like symptoms with a rash. The rash may be red or purple and then turn into blisters or peeling of the skin. You may also notice a red rash with swelling of the face, lips, or lymph nodes in your neck or under your arms. Tell your care team right away if you have any change in your eyesight. Talk to your care team if you may be pregnant. Serious birth defects can occur if you take this medication during pregnancy and for 4 months after the last dose. You will need a negative pregnancy test before starting this medication. Contraception is recommended while taking this medication and for 4 months after the last dose. Your care team can help you find the option that works for you. Do not breastfeed while taking this medication and for 4 months after the last dose. What side effects may I notice from receiving this medication? Side effects that you should report to your care team as soon as possible: Allergic reactions--skin rash, itching, hives, swelling of the face, lips, tongue, or throat Dry cough, shortness of breath or trouble breathing Eye pain, redness, irritation, or discharge with blurry or decreased vision Heart muscle inflammation--unusual weakness  or fatigue, shortness of breath, chest pain, fast or irregular heartbeat, dizziness, swelling of the ankles, feet, or hands Hormone gland problems--headache, sensitivity to light, unusual weakness or fatigue, dizziness, fast or irregular heartbeat, increased  sensitivity to cold or heat, excessive sweating, constipation, hair loss, increased thirst or amount of urine, tremors or shaking, irritability Infusion reactions--chest pain, shortness of breath or trouble breathing, feeling faint or lightheaded Kidney injury (glomerulonephritis)--decrease in the amount of urine, red or dark brown urine, foamy or bubbly urine, swelling of the ankles, hands, or feet Liver injury--right upper belly pain, loss of appetite, nausea, light-colored stool, dark yellow or brown urine, yellowing skin or eyes, unusual weakness or fatigue Pain, tingling, or numbness in the hands or feet, muscle weakness, change in vision, confusion or trouble speaking, loss of balance or coordination, trouble walking, seizures Rash, fever, and swollen lymph nodes Redness, blistering, peeling, or loosening of the skin, including inside the mouth Sudden or severe stomach pain, bloody diarrhea, fever, nausea, vomiting Side effects that usually do not require medical attention (report to your care team if they continue or are bothersome): Bone, joint, or muscle pain Diarrhea Fatigue Loss of appetite Nausea Skin rash This list may not describe all possible side effects. Call your doctor for medical advice about side effects. You may report side effects to FDA at 1-800-FDA-1088. Where should I keep my medication? This medication is given in a hospital or clinic. It will not be stored at home. NOTE: This sheet is a summary. It may not cover all possible information. If you have questions about this medicine, talk to your doctor, pharmacist, or health care provider.  2024 Elsevier/Gold Standard (2022-01-10 00:00:00)[facility] - A DEPT OF [place]  Discharge Instructions: Thank you for choosing [facility] to provide your oncology and hematology care.  If you have a lab appointment with the Cancer Center, please go directly to the Cancer Center and  check in at the registration area.   Wear comfortable clothing and clothing appropriate for easy access to any Portacath or PICC line.   We strive to give you quality time with your provider. You may need to reschedule your appointment if you arrive late (15 or more minutes).  Arriving late affects you and other patients whose appointments are after yours.  Also, if you miss three or more appointments without notifying the office, you may be dismissed from the clinic at the providers discretion.      For prescription refill requests, have your pharmacy contact our office and allow 72 hours for refills to be completed.    Today you received the following chemotherapy and/or immunotherapy agents PEMBROLIZUMAB      PEMBROLIZUMAB  To help prevent nausea and vomiting after your treatment, we encourage you to take your nausea medication as directed.  BELOW ARE SYMPTOMS THAT SHOULD BE REPORTED IMMEDIATELY: *FEVER GREATER THAN 100.4 F (38 C) OR HIGHER *CHILLS OR SWEATING *NAUSEA AND VOMITING THAT IS NOT CONTROLLED WITH YOUR NAUSEA MEDICATION *UNUSUAL SHORTNESS OF BREATH *UNUSUAL BRUISING OR BLEEDING *URINARY PROBLEMS (pain or burning when urinating, or frequent urination) *BOWEL PROBLEMS (unusual diarrhea, constipation, pain near the anus) TENDERNESS IN MOUTH AND THROAT WITH OR WITHOUT PRESENCE OF ULCERS (sore throat, sores in mouth, or a toothache) UNUSUAL RASH, SWELLING OR PAIN  UNUSUAL VAGINAL DISCHARGE OR ITCHING   Items with * indicate a potential emergency and should be followed up as soon as possible or go to the Emergency Department if  any problems should occur.  Please show the CHEMOTHERAPY ALERT CARD or IMMUNOTHERAPY ALERT CARD at check-in to the Emergency Department and triage nurse.  Should you have questions after your visit or need to cancel or reschedule your appointment, please contact [facility] - A DEPT OF [facility]  Dept: [phone]  and  follow the prompts.  Office hours are 8:00 a.m. to 4:30 p.m. Monday - Friday. Please note that voicemails left after 4:00 p.m. may not be returned until the following business day.  We are closed weekends and major holidays. You have access to a nurse at all times for urgent questions. Please call the main number to the clinic Dept: [phone] and follow the prompts.  For any non-urgent questions, you may also contact your provider using MyChart. We now offer e-Visits for anyone 84 and older to request care online for non-urgent symptoms. For details visit [email or web address].   Also download the MyChart app! Go to the app store, search MyChart, open the app, select [place], and log in with your MyChart username and password.   "

## 2024-09-12 NOTE — Progress Notes (Cosign Needed)
 " [facility] [address] [place],  [place]  [address] [phone]  Clinic Day:  08/15/2024  Referring physician: Montey Lot, PA-C   HISTORY OF PRESENT ILLNESS:  The patient is a 74 y.o. 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  He had significant pruritus despite various creams and medications, so his 17th cycle was delayed 1 week. He was treated with IV Pepcid  and Decadron , as well as prednisone  for 5 days. He comes in today to be evaluated before heading into his 18th and final cycle of adjuvant pembrolizumab . He reports persistent severe pruritus, especially right after treatment.  He states the IV medications we gave him when his 17th cycle was delayed did not help.  He feels the prednisone  helped relieve it somewhat.  As it pertains to his disease, he denies having hematuria, increased urinary frequency, or other urinary symptoms which concern him for overt signs of disease progression.   This patient's bladder cancer history dates back to at least 2014.  Over time, he has had multiple bladder tumor resections.  He has also been exposed to BCG for his previously early-stage bladder cancers.  As his disease has returned and is now high-grade in nature, he is now on pembrolizumab  for his disease management.  PHYSICAL EXAM:  Blood pressure (!) 130/58, pulse (!) 55, temperature (!) 97.5 F (36.4 C), temperature source Oral, resp. rate 16, height 6' (1.829 m), weight 154 lb 11.2 oz (70.2 kg), SpO2 92%. Wt Readings from Last 3 Encounters:  09/12/24 154 lb 11.2 oz (70.2 kg)  08/22/24 154 lb (69.9 kg)  08/15/24 155 lb (70.3 kg)   Body mass index is 20.98 kg/m. Performance status (ECOG): 1 - Symptomatic but completely ambulatory Physical Exam Vitals and nursing note reviewed.  Constitutional:      General: He is not in acute distress.    Appearance: Normal appearance. He is normal weight.  HENT:     Head: Normocephalic and atraumatic.      Mouth/Throat:     Mouth: Mucous membranes are moist.     Pharynx: Oropharynx is clear. No oropharyngeal exudate or posterior oropharyngeal erythema.  Eyes:     General: No scleral icterus.    Extraocular Movements: Extraocular movements intact.     Conjunctiva/sclera: Conjunctivae normal.     Pupils: Pupils are equal, round, and reactive to light.  Cardiovascular:     Rate and Rhythm: Normal rate and regular rhythm.     Heart sounds: Normal heart sounds. No murmur heard.    No friction rub. No gallop.  Pulmonary:     Effort: Pulmonary effort is normal.     Breath sounds: Normal breath sounds. No wheezing, rhonchi or rales.  Abdominal:     General: Bowel sounds are normal. There is no distension.     Palpations: Abdomen is soft. There is no hepatomegaly, splenomegaly or mass.     Tenderness: There is no abdominal tenderness.  Musculoskeletal:        General: Normal range of motion.     Cervical back: Normal range of motion and neck supple. No tenderness.     Right lower leg: No edema.     Left lower leg: No edema.  Lymphadenopathy:     Cervical: No cervical adenopathy.     Upper Body:     Right upper body: No supraclavicular or axillary adenopathy.     Left upper body: No supraclavicular or axillary adenopathy.     Lower  Body: No right inguinal adenopathy. No left inguinal adenopathy.  Skin:    General: Skin is warm and dry.     Coloration: Skin is not jaundiced.     Findings: No rash.  Neurological:     Mental Status: He is alert and oriented to person, place, and time.     Cranial Nerves: No cranial nerve deficit.  Psychiatric:        Mood and Affect: Mood normal.        Behavior: Behavior normal.        Thought Content: Thought content normal.    LABS:      Latest Ref Rng & Units 09/12/2024   10:21 AM 08/22/2024    9:38 AM 08/15/2024    9:15 AM  CBC  WBC 4.0 - 10.5 K/uL 8.2  8.4  7.6   Hemoglobin 13.0 - 17.0 g/dL 85.9  85.3  84.9   Hematocrit 39.0 - 52.0 % 41.0   43.1  44.5   Platelets 150 - 400 K/uL 193  181  191       Latest Ref Rng & Units 09/12/2024   10:21 AM 08/22/2024    9:38 AM 08/15/2024    9:15 AM  CMP  Glucose 70 - 99 mg/dL 96  847  892   BUN 8 - 23 mg/dL 16  16  12    Creatinine 0.61 - 1.24 mg/dL 9.08  9.01  9.02   Sodium 135 - 145 mmol/L 142  141  141   Potassium 3.5 - 5.1 mmol/L 3.7  3.7  4.0   Chloride 98 - 111 mmol/L 108  105  105   CO2 22 - 32 mmol/L 27  26  27    Calcium  8.9 - 10.3 mg/dL 9.4  9.1  9.1   Total Protein 6.5 - 8.1 g/dL 6.3  6.5  6.8   Total Bilirubin 0.0 - 1.2 mg/dL 0.5  0.7  0.7   Alkaline Phos 38 - 126 U/L 65  63  66   AST 15 - 41 U/L 18  19  21    ALT 0 - 44 U/L 7  10  10     ASSESSMENT & PLAN:  Assessment/Plan:  A 74 y.o. male with recurrent high-grade, but superficial, bladder cancer.  Clinically, he is doing fairly well.  He continues to report significant pruritus with treatment, but states nothing except prednisone  helps relieve it. He would like to proceed with his 18th and final cycle of adjuvant pembrolizumab  today. He will receive IV diphenhydramine  and famotidine  today.  He also knows to use diphenhydramine  25 mg every 6 hours as needed.  We will plan to see this patient back in 3 weeks with a CT chest, abdomen and pelvis to reevaluate for possible spread of his bladder cancer.  The patient understands all the plans discussed today and is in agreement with them.    Andrez DELENA Foy, PA-C       "

## 2024-09-13 LAB — T4: T4, Total: 7.7 ug/dL (ref 4.5–12.0)

## 2024-09-14 ENCOUNTER — Other Ambulatory Visit: Payer: Self-pay

## 2024-09-18 ENCOUNTER — Other Ambulatory Visit: Payer: Self-pay

## 2024-09-20 ENCOUNTER — Other Ambulatory Visit: Payer: Self-pay | Admitting: Cardiology

## 2024-10-02 ENCOUNTER — Encounter: Payer: Self-pay | Admitting: Oncology

## 2024-10-02 ENCOUNTER — Inpatient Hospital Stay

## 2024-10-02 ENCOUNTER — Other Ambulatory Visit: Payer: Self-pay

## 2024-10-02 VITALS — BP 134/70 | HR 59 | Temp 98.0°F | Resp 18 | Ht 72.0 in

## 2024-10-02 DIAGNOSIS — L299 Pruritus, unspecified: Secondary | ICD-10-CM

## 2024-10-02 DIAGNOSIS — C679 Malignant neoplasm of bladder, unspecified: Secondary | ICD-10-CM

## 2024-10-02 DIAGNOSIS — Z5112 Encounter for antineoplastic immunotherapy: Secondary | ICD-10-CM | POA: Diagnosis not present

## 2024-10-02 DIAGNOSIS — Z5111 Encounter for antineoplastic chemotherapy: Secondary | ICD-10-CM

## 2024-10-02 LAB — CBC WITH DIFFERENTIAL (CANCER CENTER ONLY)
Abs Immature Granulocytes: 0.01 K/uL (ref 0.00–0.07)
Basophils Absolute: 0.1 K/uL (ref 0.0–0.1)
Basophils Relative: 1 %
Eosinophils Absolute: 0.6 K/uL — ABNORMAL HIGH (ref 0.0–0.5)
Eosinophils Relative: 8 %
HCT: 41.8 % (ref 39.0–52.0)
Hemoglobin: 14.3 g/dL (ref 13.0–17.0)
Immature Granulocytes: 0 %
Lymphocytes Relative: 18 %
Lymphs Abs: 1.3 K/uL (ref 0.7–4.0)
MCH: 32.9 pg (ref 26.0–34.0)
MCHC: 34.2 g/dL (ref 30.0–36.0)
MCV: 96.3 fL (ref 80.0–100.0)
Monocytes Absolute: 0.7 K/uL (ref 0.1–1.0)
Monocytes Relative: 9 %
Neutro Abs: 4.8 K/uL (ref 1.7–7.7)
Neutrophils Relative %: 64 %
Platelet Count: 181 K/uL (ref 150–400)
RBC: 4.34 MIL/uL (ref 4.22–5.81)
RDW: 12.8 % (ref 11.5–15.5)
WBC Count: 7.5 K/uL (ref 4.0–10.5)
nRBC: 0 % (ref 0.0–0.2)

## 2024-10-02 LAB — CMP (CANCER CENTER ONLY)
ALT: 8 U/L (ref 0–44)
AST: 19 U/L (ref 15–41)
Albumin: 4 g/dL (ref 3.5–5.0)
Alkaline Phosphatase: 63 U/L (ref 38–126)
Anion gap: 8 (ref 5–15)
BUN: 11 mg/dL (ref 8–23)
CO2: 26 mmol/L (ref 22–32)
Calcium: 9 mg/dL (ref 8.9–10.3)
Chloride: 105 mmol/L (ref 98–111)
Creatinine: 0.84 mg/dL (ref 0.61–1.24)
GFR, Estimated: >60 mL/min
Glucose, Bld: 112 mg/dL — ABNORMAL HIGH (ref 70–99)
Potassium: 3.5 mmol/L (ref 3.5–5.1)
Sodium: 139 mmol/L (ref 135–145)
Total Bilirubin: 0.6 mg/dL (ref 0.0–1.2)
Total Protein: 6.6 g/dL (ref 6.5–8.1)

## 2024-10-02 LAB — TSH: TSH: 3.5 u[IU]/mL (ref 0.350–4.500)

## 2024-10-02 LAB — VITAMIN B12: Vitamin B-12: 246 pg/mL (ref 180–914)

## 2024-10-02 MED ORDER — FAMOTIDINE IN NACL 20-0.9 MG/50ML-% IV SOLN
20.0000 mg | Freq: Once | INTRAVENOUS | Status: AC
  Administered 2024-10-02: 20 mg via INTRAVENOUS
  Filled 2024-10-02: qty 50

## 2024-10-02 MED ORDER — DEXAMETHASONE SOD PHOSPHATE PF 10 MG/ML IJ SOLN
10.0000 mg | Freq: Once | INTRAMUSCULAR | Status: AC
  Administered 2024-10-02: 10 mg via INTRAVENOUS
  Filled 2024-10-02: qty 1

## 2024-10-02 MED ORDER — SODIUM CHLORIDE 0.9 % IV SOLN: INTRAVENOUS | Status: DC

## 2024-10-02 NOTE — Patient Instructions (Signed)
 Pruritus Pruritus is an itchy feeling on the skin. One of the most common causes is dry skin, but many different things can cause itching. Most cases of itching do not require medical attention. Sometimes itchy skin can turn into a rash or a secondary infection. Follow these instructions at home: Skin care  Do not use scented soaps, detergents, perfumes, and cosmetic products. Instead, use gentle, unscented versions of these items. Apply moisturizing creams to your skin frequently, at least twice daily. Apply immediately after bathing while skin is still wet. Take medicines or apply medicated creams only as told by your health care provider. This may include: Corticosteroid cream or topical calcineurin inhibitor. Anti-itch lotions containing urea, camphor, or menthol. Oral antihistamines. Do not take hot showers or baths, which can make itching worse. A short, cool shower may help with itching as long as you apply moisturizing lotion after the shower. Apply a cool, wet cloth (cool compress) to the affected areas. You may take lukewarm baths with one of the following: Epsom salts. You can get these at your local pharmacy or grocery store. Follow the instructions on the packaging. Baking soda. Pour a small amount into the bath as told by your health care provider. Colloidal oatmeal. You can get this at your local pharmacy or grocery store. Follow the instructions on the packaging. Do not scratch your skin. General instructions Avoid wearing tight clothes. Keep a journal to help find out what is causing your itching. Write down: What you eat and drink. What cosmetic products you use. What soaps or detergents you use. What you wear, including jewelry. Use a humidifier. This keeps the air moist, which helps to prevent dry skin. Be aware of any changes in your itchiness. Tell your health care provider about any changes. Contact a health care provider if: The itching does not go away after  several days. You notice redness, warmth, or drainage on the skin where you have scratched. You are unusually thirsty or urinating more than normal. Your skin tingles or feels numb. Your skin or the white parts of your eyes turn yellow (jaundice). You feel weak. You have any of the following: Night sweats. Tiredness (fatigue). Weight loss. Abdominal pain. Summary Pruritus is an itchy feeling on the skin. One of the most common causes is dry skin, but many different conditions and factors can cause itching. Apply moisturizing creams to your skin frequently, at least twice daily. Apply immediately after bathing while skin is still wet. Take medicines or apply medicated creams only as told by your health care provider. Do not take hot showers or baths. Do not use scented soaps, detergents, perfumes, or cosmetic products. Keep a journal to help find out what is causing your itching. This information is not intended to replace advice given to you by your health care provider. Make sure you discuss any questions you have with your health care provider. Document Revised: 10/05/2021 Document Reviewed: 10/05/2021 Elsevier Patient Education  2024 [employer].

## 2024-10-03 ENCOUNTER — Inpatient Hospital Stay

## 2024-10-03 ENCOUNTER — Inpatient Hospital Stay: Admitting: Oncology

## 2024-10-09 ENCOUNTER — Ambulatory Visit (INDEPENDENT_AMBULATORY_CARE_PROVIDER_SITE_OTHER)
Admission: RE | Admit: 2024-10-09 | Discharge: 2024-10-09 | Disposition: A | Discharge status: Home / Self Care | Source: Ambulatory Visit | Attending: Hematology and Oncology | Admitting: Hematology and Oncology

## 2024-10-09 DIAGNOSIS — C678 Malignant neoplasm of overlapping sites of bladder: Secondary | ICD-10-CM

## 2024-10-09 MED ORDER — IOHEXOL 300 MG/ML  SOLN
100.0000 mL | Freq: Once | INTRAMUSCULAR | Status: AC | PRN
  Administered 2024-10-09: 100 mL via INTRAVENOUS

## 2024-10-09 MED ORDER — HEPARIN SOD (PORK) LOCK FLUSH 100 UNIT/ML IV SOLN
500.0000 [IU] | Freq: Once | INTRAVENOUS | Status: AC
  Administered 2024-10-09: 500 [IU] via INTRAVENOUS

## 2024-10-09 NOTE — Progress Notes (Unsigned)
 " [facility] [address] [place],  [place]  [address] [phone]  Clinic Day:  08/15/2024  Referring physician: Montey Lot, PA-C   HISTORY OF PRESENT ILLNESS:  The patient is a 74 y.o. 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  He comes in today to be evaluated before heading into his 17th cycle of pembrolizumab .  The patient continues to have significant pruritus that has not improved after various creams and pills.  As it pertains to his disease, he denies having hematuria, increased urinary frequency, or other urinary symptoms which concern him for overt signs of disease progression.   This patient's bladder cancer history dates back to at least 2014.  Over time, he has had multiple bladder tumor resections.  He has also been exposed to BCG for his previously early-stage bladder cancers.  As his disease has returned and is now high-grade in nature, he is now on pembrolizumab  for his disease management.  PHYSICAL EXAM:  There were no vitals taken for this visit. Wt Readings from Last 3 Encounters:  09/12/24 154 lb 11.2 oz (70.2 kg)  08/22/24 154 lb (69.9 kg)  08/15/24 155 lb (70.3 kg)   There is no height or weight on file to calculate BMI. Performance status (ECOG): 1 - Symptomatic but completely ambulatory Physical Exam Constitutional:      Appearance: Normal appearance. He is not ill-appearing.  HENT:     Mouth/Throat:     Mouth: Mucous membranes are moist.     Pharynx: Oropharynx is clear. No oropharyngeal exudate or posterior oropharyngeal erythema.  Cardiovascular:     Rate and Rhythm: Normal rate and regular rhythm.     Heart sounds: No murmur heard.    No friction rub. No gallop.  Pulmonary:     Effort: Pulmonary effort is normal. No respiratory distress.     Breath sounds: Normal breath sounds. No wheezing, rhonchi or rales.  Abdominal:     General: Bowel sounds are normal. There is no distension.     Palpations:  Abdomen is soft. There is no mass.     Tenderness: There is no abdominal tenderness.  Musculoskeletal:        General: No swelling.     Right lower leg: No edema.     Left lower leg: No edema.  Lymphadenopathy:     Cervical: No cervical adenopathy.     Upper Body:     Right upper body: No supraclavicular or axillary adenopathy.     Left upper body: No supraclavicular or axillary adenopathy.     Lower Body: No right inguinal adenopathy. No left inguinal adenopathy.  Skin:    General: Skin is warm.     Coloration: Skin is not jaundiced.     Findings: Rash (diffuse rash on his back) present. No lesion.  Neurological:     General: No focal deficit present.     Mental Status: He is alert and oriented to person, place, and time. Mental status is at baseline.  Psychiatric:        Mood and Affect: Mood normal.        Behavior: Behavior normal.        Thought Content: Thought content normal.   SCANS:  CT scans of his abdomen/pelvis revealed the following: FINDINGS: CT CHEST FINDINGS   Cardiovascular: There is no cardiomegaly or pericardial effusion. Advanced 3 vessel coronary vascular calcification. Moderate atherosclerotic calcification of the thoracic aorta. No aneurysmal dilatation or dissection. The origins  of the great vessels of the aortic arch and the central pulmonary arteries appear patent.   Mediastinum/Nodes: No hilar or mediastinal adenopathy. The esophagus is grossly unremarkable. No mediastinal fluid collection. Right-sided Port-A-Cath with tip at the cavoatrial junction.   Lungs/Pleura: Background of emphysema. No focal consolidation, pleural effusion, pneumothorax. The central airways are patent.   Musculoskeletal: Median sternotomy wires. Osteopenia with degenerative changes. No acute osseous pathology.   CT ABDOMEN PELVIS FINDINGS   No intra-abdominal free air or free fluid.   Hepatobiliary: Several small liver cysts and additional subcentimeter hypodense  lesions which are too small to characterize. No biliary dilatation. Small noncalcified gallstones versus sludge ball. No pericholecystic fluid or evidence of acute cholecystitis by CT.   Pancreas: Unremarkable. No pancreatic ductal dilatation or surrounding inflammatory changes.   Spleen: Normal in size without focal abnormality.   Adrenals/Urinary Tract: The adrenal glands unremarkable. There is no hydronephrosis on either side. There is symmetric enhancement and excretion of contrast by both kidneys. The visualized ureters appear unremarkable. The urinary bladder is mildly distended. There is a 3.3 x 1.2 cm focal irregular thickening of the left lateral bladder wall. Additional focus of nodular enhancement along the anterior bladder wall (112/301 and coronal 115/601) measures approximately 11 mm findings suspicious for recurrent bladder malignancy. Additional somewhat diffusely thickened left inferior bladder wall noted. Further evaluation with cystoscopy is recommended.   Stomach/Bowel: There is no bowel obstruction or active inflammation. The appendix is normal.   Vascular/Lymphatic: Advanced aortoiliac atherosclerotic disease. The IVC is unremarkable. No portal venous gas. There is no adenopathy.   Reproductive: Enlarged prostate gland measuring 4.8 cm in transverse axial diameter.   Other: None   Musculoskeletal: Osteopenia with degenerative changes. No acute osseous pathology.   IMPRESSION: 1. Findings suspicious for recurrent bladder malignancy. Further evaluation with cystoscopy is recommended. 2. No evidence of metastatic disease in the chest, abdomen, or pelvis. 3.  Aortic Atherosclerosis (ICD10-I70.0).   LABS:      Latest Ref Rng & Units 10/02/2024    9:48 AM 09/12/2024   10:21 AM 08/22/2024    9:38 AM  CBC  WBC 4.0 - 10.5 K/uL 7.5  8.2  8.4   Hemoglobin 13.0 - 17.0 g/dL 85.6  85.9  85.3   Hematocrit 39.0 - 52.0 % 41.8  41.0  43.1   Platelets 150 - 400  K/uL 181  193  181       Latest Ref Rng & Units 10/02/2024    9:48 AM 09/12/2024   10:21 AM 08/22/2024    9:38 AM  CMP  Glucose 70 - 99 mg/dL 887  96  847   BUN 8 - 23 mg/dL 11  16  16    Creatinine 0.61 - 1.24 mg/dL 9.15  9.08  9.01   Sodium 135 - 145 mmol/L 139  142  141   Potassium 3.5 - 5.1 mmol/L 3.5  3.7  3.7   Chloride 98 - 111 mmol/L 105  108  105   CO2 22 - 32 mmol/L 26  27  26    Calcium  8.9 - 10.3 mg/dL 9.0  9.4  9.1   Total Protein 6.5 - 8.1 g/dL 6.6  6.3  6.5   Total Bilirubin 0.0 - 1.2 mg/dL 0.6  0.5  0.7   Alkaline Phos 38 - 126 U/L 63  65  63   AST 15 - 41 U/L 19  18  19    ALT 0 - 44 U/L 8  7  10  ASSESSMENT & PLAN:  Assessment/Plan:  A 74 y.o. male with recurrent high-grade, but superficial, bladder cancer.  Once again, significant pruritus remains an issue.  He will receive Pepcid  40 mg IV and Decadron  4 mg IV today.  Furthermore, I will prescribe prednisone  5 mg, which he will take for 5 days.  He also knows to use diphenhydramine  25 mg every 6 hours as needed.  His 17th cycle of pembrolizumab  will be delayed for 1 week.  I will see him back in 4 weeks before he heads into his 18th and final cycle of pembrolizumab .  The patient understands all the plans discussed today and is in agreement with them.   Cristie Mckinney DELENA Kerns, MD       "

## 2024-10-10 ENCOUNTER — Inpatient Hospital Stay: Admitting: Oncology
